# Patient Record
Sex: Male | Born: 1950 | Race: White | Hispanic: No | Marital: Married | Smoking: Never smoker
Health system: Southern US, Community
[De-identification: ages and names within clinical notes are randomized; demographics above are authoritative.]

## PROBLEM LIST (undated history)

## (undated) DIAGNOSIS — R3915 Urgency of urination: Secondary | ICD-10-CM

## (undated) DIAGNOSIS — Z9189 Other specified personal risk factors, not elsewhere classified: Secondary | ICD-10-CM

## (undated) DIAGNOSIS — K4090 Unilateral inguinal hernia, without obstruction or gangrene, not specified as recurrent: Secondary | ICD-10-CM

## (undated) DIAGNOSIS — R319 Hematuria, unspecified: Secondary | ICD-10-CM

## (undated) DIAGNOSIS — Z9889 Other specified postprocedural states: Secondary | ICD-10-CM

## (undated) DIAGNOSIS — R0600 Dyspnea, unspecified: Secondary | ICD-10-CM

## (undated) DIAGNOSIS — Z85820 Personal history of malignant melanoma of skin: Secondary | ICD-10-CM

## (undated) DIAGNOSIS — Z87441 Personal history of nephrotic syndrome: Secondary | ICD-10-CM

## (undated) DIAGNOSIS — R350 Frequency of micturition: Secondary | ICD-10-CM

## (undated) DIAGNOSIS — N3289 Other specified disorders of bladder: Secondary | ICD-10-CM

## (undated) DIAGNOSIS — N189 Chronic kidney disease, unspecified: Secondary | ICD-10-CM

## (undated) DIAGNOSIS — C4490 Unspecified malignant neoplasm of skin, unspecified: Secondary | ICD-10-CM

## (undated) DIAGNOSIS — C679 Malignant neoplasm of bladder, unspecified: Secondary | ICD-10-CM

## (undated) DIAGNOSIS — E785 Hyperlipidemia, unspecified: Secondary | ICD-10-CM

## (undated) DIAGNOSIS — C61 Malignant neoplasm of prostate: Secondary | ICD-10-CM

## (undated) DIAGNOSIS — C672 Malignant neoplasm of lateral wall of bladder: Secondary | ICD-10-CM

## (undated) DIAGNOSIS — Z87448 Personal history of other diseases of urinary system: Secondary | ICD-10-CM

## (undated) DIAGNOSIS — K429 Umbilical hernia without obstruction or gangrene: Secondary | ICD-10-CM

## (undated) DIAGNOSIS — R55 Syncope and collapse: Secondary | ICD-10-CM

## (undated) DIAGNOSIS — R972 Elevated prostate specific antigen [PSA]: Secondary | ICD-10-CM

## (undated) DIAGNOSIS — I2510 Atherosclerotic heart disease of native coronary artery without angina pectoris: Secondary | ICD-10-CM

## (undated) DIAGNOSIS — Z86718 Personal history of other venous thrombosis and embolism: Secondary | ICD-10-CM

## (undated) DIAGNOSIS — R310 Gross hematuria: Secondary | ICD-10-CM

## (undated) DIAGNOSIS — K219 Gastro-esophageal reflux disease without esophagitis: Secondary | ICD-10-CM

## (undated) DIAGNOSIS — I10 Essential (primary) hypertension: Secondary | ICD-10-CM

## (undated) DIAGNOSIS — Z87828 Personal history of other (healed) physical injury and trauma: Secondary | ICD-10-CM

## (undated) HISTORY — DX: Malignant neoplasm of prostate: C61

## (undated) HISTORY — DX: Elevated prostate specific antigen (PSA): R97.20

## (undated) HISTORY — PX: EYE SURGERY: SHX253

## (undated) HISTORY — PX: BLADDER INSTILLATION: SUR156

## (undated) HISTORY — DX: Hyperlipidemia, unspecified: E78.5

## (undated) HISTORY — PX: PROSTATE BIOPSY: SHX241

## (undated) HISTORY — DX: Chronic kidney disease, unspecified: N18.9

## (undated) HISTORY — DX: Malignant neoplasm of lateral wall of bladder: C67.2

## (undated) HISTORY — DX: Gastro-esophageal reflux disease without esophagitis: K21.9

## (undated) HISTORY — DX: Essential (primary) hypertension: I10

## (undated) HISTORY — DX: Gross hematuria: R31.0

## (undated) HISTORY — DX: Personal history of other diseases of urinary system: Z87.448

---

## 1987-08-19 HISTORY — PX: CHOLECYSTECTOMY: SHX55

## 2005-02-20 ENCOUNTER — Ambulatory Visit (HOSPITAL_BASED_OUTPATIENT_CLINIC_OR_DEPARTMENT_OTHER): Admission: RE | Admit: 2005-02-20 | Discharge: 2005-02-20 | Payer: Self-pay | Admitting: General Surgery

## 2005-02-20 ENCOUNTER — Ambulatory Visit (HOSPITAL_COMMUNITY): Admission: RE | Admit: 2005-02-20 | Discharge: 2005-02-20 | Payer: Self-pay | Admitting: General Surgery

## 2008-08-18 HISTORY — PX: MELANOMA EXCISION: SHX5266

## 2008-11-06 ENCOUNTER — Encounter: Admission: RE | Admit: 2008-11-06 | Discharge: 2008-11-06 | Payer: Self-pay | Admitting: Family Medicine

## 2008-12-22 ENCOUNTER — Encounter: Admission: RE | Admit: 2008-12-22 | Discharge: 2008-12-22 | Payer: Self-pay | Admitting: Nephrology

## 2009-02-02 ENCOUNTER — Encounter (INDEPENDENT_AMBULATORY_CARE_PROVIDER_SITE_OTHER): Payer: Self-pay | Admitting: Nephrology

## 2009-02-02 ENCOUNTER — Ambulatory Visit (HOSPITAL_COMMUNITY): Admission: RE | Admit: 2009-02-02 | Discharge: 2009-02-03 | Payer: Self-pay | Admitting: Nephrology

## 2009-02-08 ENCOUNTER — Encounter: Admission: RE | Admit: 2009-02-08 | Discharge: 2009-02-08 | Payer: Self-pay | Admitting: Nephrology

## 2009-05-16 ENCOUNTER — Inpatient Hospital Stay (HOSPITAL_COMMUNITY): Admission: EM | Admit: 2009-05-16 | Discharge: 2009-05-21 | Payer: Self-pay | Admitting: Emergency Medicine

## 2009-05-21 ENCOUNTER — Encounter (INDEPENDENT_AMBULATORY_CARE_PROVIDER_SITE_OTHER): Payer: Self-pay | Admitting: Nephrology

## 2009-05-21 ENCOUNTER — Ambulatory Visit: Payer: Self-pay | Admitting: Vascular Surgery

## 2009-05-30 ENCOUNTER — Encounter: Admission: RE | Admit: 2009-05-30 | Discharge: 2009-05-30 | Payer: Self-pay | Admitting: Family Medicine

## 2010-04-29 ENCOUNTER — Encounter: Admission: RE | Admit: 2010-04-29 | Discharge: 2010-05-17 | Payer: Self-pay | Admitting: Nephrology

## 2010-09-08 ENCOUNTER — Encounter: Payer: Self-pay | Admitting: Family Medicine

## 2010-11-21 LAB — CBC
HCT: 36.5 % — ABNORMAL LOW (ref 39.0–52.0)
HCT: 37.4 % — ABNORMAL LOW (ref 39.0–52.0)
HCT: 39.3 % (ref 39.0–52.0)
Hemoglobin: 12.5 g/dL — ABNORMAL LOW (ref 13.0–17.0)
Hemoglobin: 12.9 g/dL — ABNORMAL LOW (ref 13.0–17.0)
Hemoglobin: 13.9 g/dL (ref 13.0–17.0)
MCHC: 34.2 g/dL (ref 30.0–36.0)
MCHC: 34.3 g/dL (ref 30.0–36.0)
MCHC: 34.4 g/dL (ref 30.0–36.0)
MCHC: 35.3 g/dL (ref 30.0–36.0)
MCV: 93.7 fL (ref 78.0–100.0)
MCV: 94 fL (ref 78.0–100.0)
MCV: 94.2 fL (ref 78.0–100.0)
MCV: 94.5 fL (ref 78.0–100.0)
Platelets: 159 K/uL (ref 150–400)
Platelets: 173 K/uL (ref 150–400)
Platelets: 220 K/uL (ref 150–400)
RBC: 3.86 MIL/uL — ABNORMAL LOW (ref 4.22–5.81)
RBC: 3.97 MIL/uL — ABNORMAL LOW (ref 4.22–5.81)
RBC: 4.19 MIL/uL — ABNORMAL LOW (ref 4.22–5.81)
RDW: 13.6 % (ref 11.5–15.5)
RDW: 13.8 % (ref 11.5–15.5)
RDW: 13.9 % (ref 11.5–15.5)
RDW: 14.1 % (ref 11.5–15.5)
WBC: 10.5 K/uL (ref 4.0–10.5)
WBC: 6.9 K/uL (ref 4.0–10.5)
WBC: 7.9 K/uL (ref 4.0–10.5)

## 2010-11-21 LAB — COMPREHENSIVE METABOLIC PANEL WITH GFR
ALT: 21 U/L (ref 0–53)
AST: 23 U/L (ref 0–37)
Albumin: 1.8 g/dL — ABNORMAL LOW (ref 3.5–5.2)
Alkaline Phosphatase: 66 U/L (ref 39–117)
BUN: 16 mg/dL (ref 6–23)
CO2: 25 meq/L (ref 19–32)
Calcium: 8 mg/dL — ABNORMAL LOW (ref 8.4–10.5)
Chloride: 99 meq/L (ref 96–112)
Creatinine, Ser: 1.66 mg/dL — ABNORMAL HIGH (ref 0.4–1.5)
GFR calc non Af Amer: 43 mL/min — ABNORMAL LOW
Glucose, Bld: 94 mg/dL (ref 70–99)
Potassium: 3.7 meq/L (ref 3.5–5.1)
Sodium: 134 meq/L — ABNORMAL LOW (ref 135–145)
Total Bilirubin: 0.9 mg/dL (ref 0.3–1.2)
Total Protein: 5.3 g/dL — ABNORMAL LOW (ref 6.0–8.3)

## 2010-11-21 LAB — PROTIME-INR
INR: 1.2 (ref 0.00–1.49)
INR: 2.1 — ABNORMAL HIGH (ref 0.00–1.49)
Prothrombin Time: 15.1 s (ref 11.6–15.2)
Prothrombin Time: 23.7 s — ABNORMAL HIGH (ref 11.6–15.2)
Prothrombin Time: 25.2 seconds — ABNORMAL HIGH (ref 11.6–15.2)

## 2010-11-21 LAB — RENAL FUNCTION PANEL
Albumin: 1.5 g/dL — ABNORMAL LOW (ref 3.5–5.2)
Albumin: 1.5 g/dL — ABNORMAL LOW (ref 3.5–5.2)
BUN: 15 mg/dL (ref 6–23)
BUN: 21 mg/dL (ref 6–23)
BUN: 24 mg/dL — ABNORMAL HIGH (ref 6–23)
CO2: 25 mEq/L (ref 19–32)
CO2: 26 meq/L (ref 19–32)
Calcium: 7.6 mg/dL — ABNORMAL LOW (ref 8.4–10.5)
Calcium: 7.8 mg/dL — ABNORMAL LOW (ref 8.4–10.5)
Calcium: 7.9 mg/dL — ABNORMAL LOW (ref 8.4–10.5)
Chloride: 100 meq/L (ref 96–112)
Creatinine, Ser: 1.81 mg/dL — ABNORMAL HIGH (ref 0.4–1.5)
Creatinine, Ser: 1.94 mg/dL — ABNORMAL HIGH (ref 0.4–1.5)
GFR calc non Af Amer: 36 mL/min — ABNORMAL LOW
Glucose, Bld: 103 mg/dL — ABNORMAL HIGH (ref 70–99)
Glucose, Bld: 97 mg/dL (ref 70–99)
Phosphorus: 4 mg/dL (ref 2.3–4.6)
Phosphorus: 4 mg/dL (ref 2.3–4.6)
Phosphorus: 5.2 mg/dL — ABNORMAL HIGH (ref 2.3–4.6)
Potassium: 3.9 meq/L (ref 3.5–5.1)
Potassium: 4 mEq/L (ref 3.5–5.1)
Sodium: 134 meq/L — ABNORMAL LOW (ref 135–145)

## 2010-11-21 LAB — HEPARIN LEVEL (UNFRACTIONATED): Heparin Unfractionated: <0.1 IU/mL — ABNORMAL LOW (ref 0.30–0.70)

## 2010-11-21 LAB — HEMOCCULT GUIAC POC 1CARD (OFFICE): Fecal Occult Bld: NEGATIVE

## 2010-11-22 LAB — URINALYSIS, ROUTINE W REFLEX MICROSCOPIC
Glucose, UA: NEGATIVE mg/dL
Protein, ur: >300 mg/dL — AB
pH: 6 (ref 5.0–8.0)

## 2010-11-22 LAB — POCT I-STAT, CHEM 8
BUN: 8 mg/dL (ref 6–23)
Calcium, Ion: 1.08 mmol/L — ABNORMAL LOW (ref 1.12–1.32)
Creatinine, Ser: 1.3 mg/dL (ref 0.4–1.5)
Glucose, Bld: 109 mg/dL — ABNORMAL HIGH (ref 70–99)
Hemoglobin: 13.9 g/dL (ref 13.0–17.0)
Sodium: 137 mEq/L (ref 135–145)
TCO2: 24 mmol/L (ref 0–100)

## 2010-11-22 LAB — COMPREHENSIVE METABOLIC PANEL
AST: 14 U/L (ref 0–37)
CO2: 27 mEq/L (ref 19–32)
Calcium: 8.1 mg/dL — ABNORMAL LOW (ref 8.4–10.5)
Chloride: 101 mEq/L (ref 96–112)
Creatinine, Ser: 1.69 mg/dL — ABNORMAL HIGH (ref 0.4–1.5)
GFR calc Af Amer: 51 mL/min — ABNORMAL LOW (ref >60)
GFR calc non Af Amer: 42 mL/min — ABNORMAL LOW (ref >60)
Glucose, Bld: 98 mg/dL (ref 70–99)
Total Bilirubin: 0.8 mg/dL (ref 0.3–1.2)

## 2010-11-22 LAB — DIFFERENTIAL
Eosinophils Absolute: 0 10*3/uL (ref 0.0–0.7)
Eosinophils Relative: 1 % (ref 0–5)
Lymphocytes Relative: 11 % — ABNORMAL LOW (ref 12–46)
Lymphs Abs: 0.9 10*3/uL (ref 0.7–4.0)
Monocytes Absolute: 0.5 10*3/uL (ref 0.1–1.0)
Monocytes Relative: 6 % (ref 3–12)

## 2010-11-22 LAB — CBC
HCT: 38.9 % — ABNORMAL LOW (ref 39.0–52.0)
HCT: 41.7 % (ref 39.0–52.0)
Hemoglobin: 14.6 g/dL (ref 13.0–17.0)
MCV: 92.8 fL (ref 78.0–100.0)
MCV: 94.3 fL (ref 78.0–100.0)
Platelets: 150 10*3/uL (ref 150–400)
Platelets: 175 10*3/uL (ref 150–400)
RBC: 4.49 MIL/uL (ref 4.22–5.81)
RDW: 14 % (ref 11.5–15.5)
WBC: 8.2 10*3/uL (ref 4.0–10.5)

## 2010-11-22 LAB — PROTIME-INR: INR: 1 (ref 0.00–1.49)

## 2010-11-22 LAB — RENAL FUNCTION PANEL
Albumin: 2 g/dL — ABNORMAL LOW (ref 3.5–5.2)
BUN: 11 mg/dL (ref 6–23)
Creatinine, Ser: 1.43 mg/dL (ref 0.4–1.5)
GFR calc Af Amer: >60 mL/min (ref >60)
GFR calc non Af Amer: 51 mL/min — ABNORMAL LOW (ref >60)
Phosphorus: 3.9 mg/dL (ref 2.3–4.6)

## 2010-11-22 LAB — EXPECTORATED SPUTUM ASSESSMENT W GRAM STAIN, RFLX TO RESP C

## 2010-11-22 LAB — CULTURE, BLOOD (ROUTINE X 2): Culture: NO GROWTH

## 2010-11-22 LAB — CULTURE, RESPIRATORY W GRAM STAIN: Culture: NORMAL

## 2010-11-22 LAB — HEPARIN LEVEL (UNFRACTIONATED)
Heparin Unfractionated: 0.26 IU/mL — ABNORMAL LOW (ref 0.30–0.70)
Heparin Unfractionated: 0.39 IU/mL (ref 0.30–0.70)

## 2010-11-22 LAB — URINE MICROSCOPIC-ADD ON

## 2010-11-22 LAB — URINE CULTURE

## 2010-11-25 LAB — CBC
Hemoglobin: 14.4 g/dL (ref 13.0–17.0)
Hemoglobin: 15.8 g/dL (ref 13.0–17.0)
MCHC: 34.8 g/dL (ref 30.0–36.0)
MCHC: 35.1 g/dL (ref 30.0–36.0)
MCV: 92 fL (ref 78.0–100.0)
MCV: 92.1 fL (ref 78.0–100.0)
Platelets: 181 10*3/uL (ref 150–400)
RBC: 4.46 MIL/uL (ref 4.22–5.81)
RBC: 4.54 MIL/uL (ref 4.22–5.81)
RBC: 5.01 MIL/uL (ref 4.22–5.81)
RDW: 13.9 % (ref 11.5–15.5)
RDW: 14.2 % (ref 11.5–15.5)
WBC: 6.8 10*3/uL (ref 4.0–10.5)
WBC: 7.4 10*3/uL (ref 4.0–10.5)

## 2010-11-25 LAB — DIFFERENTIAL
Basophils Relative: 1 % (ref 0–1)
Eosinophils Absolute: 0.1 10*3/uL (ref 0.0–0.7)
Monocytes Relative: 8 % (ref 3–12)
Neutrophils Relative %: 61 % (ref 43–77)

## 2010-11-25 LAB — CROSSMATCH
ABO/RH(D): O POS
Antibody Screen: NEGATIVE

## 2010-11-25 LAB — COMPREHENSIVE METABOLIC PANEL
ALT: 18 U/L (ref 0–53)
Alkaline Phosphatase: 76 U/L (ref 39–117)
CO2: 28 mEq/L (ref 19–32)
Chloride: 107 mEq/L (ref 96–112)
Glucose, Bld: 109 mg/dL — ABNORMAL HIGH (ref 70–99)
Potassium: 4.3 mEq/L (ref 3.5–5.1)
Sodium: 142 mEq/L (ref 135–145)
Total Bilirubin: 0.5 mg/dL (ref 0.3–1.2)
Total Protein: 6.4 g/dL (ref 6.0–8.3)

## 2010-11-25 LAB — PROTIME-INR
INR: 0.9 (ref 0.00–1.49)
Prothrombin Time: 11.9 seconds (ref 11.6–15.2)

## 2010-11-25 LAB — PLATELET FUNCTION ASSAY

## 2010-12-31 NOTE — Discharge Summary (Signed)
[name], KUMPF NO.:  [id number]   MEDICAL RECORD NO.:  [id number]          PATIENT TYPE:  OIB   LOCATION:  6739                         FACILITY:  [facility]   PHYSICIAN:  Jaaziah L. Deterding, M.D.DATE OF BIRTH:  November 26, 1950   DATE OF ADMISSION:  02/02/2009  DATE OF DISCHARGE:  02/03/2009                               DISCHARGE SUMMARY   ADMITTING DIAGNOSES:  1. Nephrotic syndrome.  2. Hypertension.   DISCHARGE DIAGNOSES:  1. Nephrotic syndrome status post right renal biopsy.  2. Hypertension.   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 Fayrene Fearing  Deterding at [facility].  He was admitted for an  elective renal biopsy.  Serological workup was negative as an  outpatient.  He has carried mild edema and has mild hypertension.   LABORATORY DATA:  On admission, hemoglobin 15.8.  BUN and creatinine  normal.   HOSPITAL COURSE:  [name] underwent a right kidney biopsy under  ultrasound guidance by Dr. Fayrene Fearing Deterding.  He tolerated the procedure  well.  Post biopsy, he had some mild right flank discomfort, but no  serious pain and without gross hematuria.  He remained at bedrest until  the following day.  Post biopsy, hemoglobin was 14.4 and day of  discharge, hemoglobin was 14.6.  He had no flank pain at time of  discharge and his urine was yellow.  He was discharged in stable  condition, told to avoid strenuous activity, and no heavy lifting  greater than 10 pounds.  He was instructed to call [facility] immediately should he have sharp acute onset of right  flank pain or his urine becomes bloody.   DISCHARGE MEDICATIONS:  1. Zyrtec 10 mg daily as needed.  2. Hydrochlorothiazide 12.5 mg q.a.m.   No aspirin, Motrin, Aleve, Advil or generic equivalents.  Dr. Darrick Penna  will phone the patient with biopsy results mid next week.      Zenovia Jordan, P.A.    ______________________________  Llana Aliment. Deterding, M.D.    RRK/MEDQ  D:  02/03/2009  T:  02/04/2009  Job:  [id number]

## 2011-03-26 ENCOUNTER — Encounter (INDEPENDENT_AMBULATORY_CARE_PROVIDER_SITE_OTHER): Payer: Self-pay | Admitting: General Surgery

## 2011-03-27 ENCOUNTER — Encounter (INDEPENDENT_AMBULATORY_CARE_PROVIDER_SITE_OTHER): Payer: Self-pay | Admitting: General Surgery

## 2011-03-27 ENCOUNTER — Ambulatory Visit (INDEPENDENT_AMBULATORY_CARE_PROVIDER_SITE_OTHER): Payer: BC Managed Care – PPO | Admitting: General Surgery

## 2011-03-27 DIAGNOSIS — K4090 Unilateral inguinal hernia, without obstruction or gangrene, not specified as recurrent: Secondary | ICD-10-CM | POA: Insufficient documentation
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
## 2011-03-27 NOTE — Patient Instructions (Signed)
Need clearance for surgery from Dr. Darrick Penna Will need to stop aspirin and cytoxan 5 days before surgery

## 2011-04-10 ENCOUNTER — Telehealth (INDEPENDENT_AMBULATORY_CARE_PROVIDER_SITE_OTHER): Payer: Self-pay | Admitting: General Surgery

## 2011-04-10 NOTE — Telephone Encounter (Signed)
Pt is calling to see if we have spoken with Dr. Darrick Penna regarding surgery. Please contact the patient in regards to this.

## 2011-05-20 ENCOUNTER — Other Ambulatory Visit (INDEPENDENT_AMBULATORY_CARE_PROVIDER_SITE_OTHER): Payer: Self-pay | Admitting: General Surgery

## 2011-05-20 ENCOUNTER — Encounter (HOSPITAL_COMMUNITY)
Admission: RE | Admit: 2011-05-20 | Discharge: 2011-05-20 | Disposition: A | Discharge status: Home / Self Care | Payer: BC Managed Care – PPO | Source: Ambulatory Visit | Attending: General Surgery | Admitting: General Surgery

## 2011-05-20 DIAGNOSIS — K4090 Unilateral inguinal hernia, without obstruction or gangrene, not specified as recurrent: Secondary | ICD-10-CM

## 2011-05-20 LAB — CBC
Hemoglobin: 14.8 g/dL (ref 13.0–17.0)
Platelets: 165 10*3/uL (ref 150–400)
RBC: 4.58 MIL/uL (ref 4.22–5.81)
WBC: 6.3 10*3/uL (ref 4.0–10.5)

## 2011-05-20 LAB — DIFFERENTIAL
Basophils Relative: 1 % (ref 0–1)
Eosinophils Absolute: 0.1 10*3/uL (ref 0.0–0.7)
Monocytes Relative: 8 % (ref 3–12)
Neutro Abs: 4.2 10*3/uL (ref 1.7–7.7)
Neutrophils Relative %: 66 % (ref 43–77)

## 2011-05-20 LAB — BASIC METABOLIC PANEL
Chloride: 103 mEq/L (ref 96–112)
GFR calc Af Amer: 82 mL/min — ABNORMAL LOW (ref >90)
GFR calc non Af Amer: 71 mL/min — ABNORMAL LOW (ref >90)
Potassium: 4.7 mEq/L (ref 3.5–5.1)
Sodium: 138 mEq/L (ref 135–145)

## 2011-05-20 LAB — SURGICAL PCR SCREEN
MRSA, PCR: NEGATIVE
Staphylococcus aureus: NEGATIVE

## 2011-05-26 ENCOUNTER — Ambulatory Visit (HOSPITAL_COMMUNITY)
Admission: RE | Admit: 2011-05-26 | Discharge: 2011-05-26 | Disposition: A | Discharge status: Home / Self Care | Payer: BC Managed Care – PPO | Source: Ambulatory Visit | Attending: General Surgery | Admitting: General Surgery

## 2011-05-26 DIAGNOSIS — K4090 Unilateral inguinal hernia, without obstruction or gangrene, not specified as recurrent: Secondary | ICD-10-CM | POA: Insufficient documentation

## 2011-05-26 DIAGNOSIS — Z01818 Encounter for other preprocedural examination: Secondary | ICD-10-CM | POA: Insufficient documentation

## 2011-05-26 DIAGNOSIS — Z01812 Encounter for preprocedural laboratory examination: Secondary | ICD-10-CM | POA: Insufficient documentation

## 2011-05-26 DIAGNOSIS — Z01810 Encounter for preprocedural cardiovascular examination: Secondary | ICD-10-CM | POA: Insufficient documentation

## 2011-05-26 DIAGNOSIS — I10 Essential (primary) hypertension: Secondary | ICD-10-CM | POA: Insufficient documentation

## 2011-05-26 HISTORY — PX: INGUINAL HERNIA REPAIR: SUR1180

## 2011-05-27 ENCOUNTER — Telehealth (INDEPENDENT_AMBULATORY_CARE_PROVIDER_SITE_OTHER): Payer: Self-pay | Admitting: General Surgery

## 2011-05-27 NOTE — Telephone Encounter (Signed)
[name] called and stated he had an inguinal hernia repair by Dr. Carolynne Edouard yesterday. He stated that he felt warm and is running a fever of 100. There is no redness around the incision. I felt this was likely pulmonary in origin. I encouraged him to stand up and walk around and take deep breathes.  If he has fever greater than 101 I told him to call back.

## 2011-06-04 ENCOUNTER — Telehealth (INDEPENDENT_AMBULATORY_CARE_PROVIDER_SITE_OTHER): Payer: Self-pay

## 2011-06-04 NOTE — Telephone Encounter (Signed)
C/O small amount of drainage from hernia incision site- seen on dressing- still puffy- RIH repair 05/26/2011- Patient was told this is not uncommon if drainage amount or swelling increases please call back. Or if incision show any signs of infection call back. Patient was told the signs of infection. RMP

## 2011-06-05 ENCOUNTER — Ambulatory Visit (INDEPENDENT_AMBULATORY_CARE_PROVIDER_SITE_OTHER): Payer: BC Managed Care – PPO | Admitting: General Surgery

## 2011-06-05 ENCOUNTER — Encounter (INDEPENDENT_AMBULATORY_CARE_PROVIDER_SITE_OTHER): Payer: Self-pay | Admitting: General Surgery

## 2011-06-05 DIAGNOSIS — K4090 Unilateral inguinal hernia, without obstruction or gangrene, not specified as recurrent: Secondary | ICD-10-CM

## 2011-06-05 NOTE — Telephone Encounter (Signed)
APPT MADE FOR PT TODAY WITH DR. Carolynne Edouard.

## 2011-06-05 NOTE — Telephone Encounter (Signed)
i would like to see him soon. He should not have any drainage.

## 2011-06-05 NOTE — Telephone Encounter (Signed)
APPT MADE WITH DR. TOTH TODAY

## 2011-06-05 NOTE — Progress Notes (Signed)
Subjective:     Patient ID: [name], male   DOB: [birth year], 60 y.o.   MRN: [id number]  HPI The patient is a 60 year old white male who is now about 10 days out from a right inguinal hernia repair with mesh. He called to say that he was having a little drainage from the incision. We brought him in today to check this and make sure it was okay. He denies any fevers or chills. His discomfort is improving. Review of Systems     Objective:   Physical Exam On exam the patient's abdomen is soft nontender. The right groin incision is healing well. There is no sign of infection. I think the drainage is just a little bit of serous fluid from a couple of raw areas at the skin incision.    Assessment:     10 days out from a right inguinal hernia repair with mesh    Plan:     He seems to be doing well. I've encouraged him to continue to shower daily and keep area clean and dry. We will plan to see him back in about 2 weeks for recheck.

## 2011-06-05 NOTE — Patient Instructions (Signed)
Shower daily Keep area clean and dry

## 2011-06-06 NOTE — Op Note (Signed)
NAMEDEANTE, BLOUGH NO.:  [id number]  MEDICAL RECORD NO.:  [id number]  LOCATION:  SDSC                         FACILITY:  [facility]  PHYSICIAN:  Ollen Gross. Vernell Morgans, M.D. DATE OF BIRTH:  12-31-1950  DATE OF PROCEDURE:  05/26/2011 DATE OF DISCHARGE:  05/26/2011                              OPERATIVE REPORT   PREOPERATIVE DIAGNOSIS:  Right inguinal hernia.  POSTOPERATIVE DIAGNOSIS:  Right indirect inguinal hernia.  PROCEDURE:  Right inguinal hernia repair with mesh.  SURGEON:  Ollen Gross. Vernell Morgans, MD  ANESTHESIA:  General via LMA.  DESCRIPTION OF PROCEDURE:  After informed consent was obtained, the patient was brought to the operating room, placed in the supine position on the operating room table.  After adequate induction of general anesthesia, the patient's abdomen was prepped with ChloraPrep, allowed to dry, and draped in usual sterile manner.  The right groin area was then infiltrated with 0.25% Marcaine.  A small incision was made from the edge of the pubic tubercle on the right towards the anterior cephalic spine.  This incision was carried down through the skin and subcutaneous tissue sharply with the electrocautery until the fascia of the external oblique was encountered.  Two small bridging veins were clamped with hemostats, divided, and ligated 3-0 silk ties.  The external oblique fascia was opened along its fibers towards the apex of the external ring with a 15-blade knife and Metzenbaum scissors. Weitlaner retractor was deployed.  Blunt dissection was carried out of the cord structures until they could be surrounded between 2 fingers.  A 1/2-inch Penrose drain was placed around the cord structures for retraction purposes.  The cord structures were then gently skeletonized by blunt hemostat dissection and some sharp dissection with the electrocautery until a hernia sac was able to be identified.  He was gently separated from the rest of the cord  structures again by blunt hemostat dissection and some sharp dissection with the electrocautery. The sac was very thick walled and broad based.  We therefore decided not to try to open the sac, but simply to reduce it.  We reduced back beneath the transversalis muscle and then repaired the floor of the canal over this with the interrupted 0 Vicryl stitches.  Once this was accomplished, the floor appeared to be intact.  The hernia was reduced. We then chose a 3 x 6 piece of UltraPro mesh and cut it to fit.  The inferior edge of the mesh was sewed to the shelving edge of the inguinal ligament with a running 2-0 Prolene stitch.  Tails were cut in the mesh laterally.  The tails were wrapped around the cord structures. Superiorly, the mesh was sewed to the muscular aponeurotic strength layer of the transversalis with interrupted 2-0 Prolene vertical mattress stitches.  Lateral to the cord, the mesh was sewed to the shelving edge of the inguinal ligament with interrupted 2-0 Prolene stitch.  Prior to all this, the ileal inguinal nerve was identified, it was involved with some scar tissue, was dissected free, proximally and distally divided, and ligated with 3-0 silk ties.  At this point, the hernia appeared to be well repaired and the mesh  was in good position without any tension.  The wound was irrigated with copious amounts of saline.  The external oblique fascia was reapproximated with running 2-0 Vicryl stitch subcutaneous.  The wound was infiltrated with 0.25% Marcaine.  The subcutaneous fascia was closed with 2 layers of 3-0 Vicryl stitches and the skin was closed with a running 4-0 Monocryl subcuticular stitch.  Dermabond dressing was applied.  The patient tolerated the procedure well.  At the end of the case, all needle, sponge, and instrument counts were correct.  The patient's testicle was in the scrotum at the end of the case . He was taken to the recovery room in stable  condition.     Ollen Gross. Vernell Morgans, M.D.     PST/MEDQ  D:  05/26/2011  T:  05/26/2011  Job:  [id number]  Electronically Signed by Chevis Pretty III M.D. on 06/06/2011 09:08:39 AM

## 2011-06-17 ENCOUNTER — Ambulatory Visit (INDEPENDENT_AMBULATORY_CARE_PROVIDER_SITE_OTHER): Payer: BC Managed Care – PPO | Admitting: General Surgery

## 2011-06-17 ENCOUNTER — Encounter (INDEPENDENT_AMBULATORY_CARE_PROVIDER_SITE_OTHER): Payer: Self-pay | Admitting: General Surgery

## 2011-06-17 DIAGNOSIS — K4090 Unilateral inguinal hernia, without obstruction or gangrene, not specified as recurrent: Secondary | ICD-10-CM

## 2011-06-17 NOTE — Progress Notes (Signed)
Subjective:     Patient ID: [name], male   DOB: 1950-10-16, 60 y.o.   MRN: [id number]  HPI The patient is a 60 year old white male who is now 3 weeks out from a right inguinal hernia repair with mesh. He is doing well and has no complaints today. He recently had an upper respiratory infection and has been coughing but this has not been causing much difficulty.  Review of Systems     Objective:   Physical Exam On exam his abdomen is soft and nontender. His right groin incision is healing nicely. There is no sign of infection. There is no palpable evidence for recurrence of the hernia.    Assessment:     3 weeks status post right inguinal hernia repair with mesh    Plan:     At this point I would like him to continue to refrain from any heavy lifting. We will plan to see him back in about 3 weeks to check his progress.

## 2011-06-17 NOTE — Patient Instructions (Signed)
Do not lift anything heavy for another 3 weeks

## 2011-07-15 ENCOUNTER — Encounter (INDEPENDENT_AMBULATORY_CARE_PROVIDER_SITE_OTHER): Payer: Self-pay | Admitting: General Surgery

## 2011-07-15 ENCOUNTER — Ambulatory Visit (INDEPENDENT_AMBULATORY_CARE_PROVIDER_SITE_OTHER): Payer: BC Managed Care – PPO | Admitting: General Surgery

## 2011-07-15 DIAGNOSIS — K4090 Unilateral inguinal hernia, without obstruction or gangrene, not specified as recurrent: Secondary | ICD-10-CM

## 2011-07-15 NOTE — Patient Instructions (Signed)
May return to all normal activities 

## 2011-07-15 NOTE — Progress Notes (Signed)
Subjective:     Patient ID: [name], male   DOB: 05/19/1951, 60 y.o.   MRN: [id number]  HPI The patient is a 60 year old white male who is now about 6 weeks out from a right inguinal hernia repair with mesh. He is doing very well. He has no complaints today. He's not having any abdominal pain. His appetite is good and his bowels are working normally.  Review of Systems     Objective:   Physical Exam On exam his abdomen is soft and nontender. His right inguinal incision is healing nicely. There is no sign of infection. There is no palpable evidence of recurrence of the hernia.    Assessment:     6 weeks status post right inguinal hernia repair with mesh    Plan:     At this point I believe he can return on his normal activities without any restrictions. We will plan to see him back on a p.r.n. basis

## 2011-10-07 ENCOUNTER — Ambulatory Visit
Admission: RE | Admit: 2011-10-07 | Discharge: 2011-10-07 | Disposition: A | Discharge status: Home / Self Care | Payer: BC Managed Care – PPO | Source: Ambulatory Visit | Attending: Family Medicine | Admitting: Family Medicine

## 2011-10-07 ENCOUNTER — Other Ambulatory Visit: Payer: Self-pay | Admitting: Family Medicine

## 2011-10-07 DIAGNOSIS — R05 Cough: Secondary | ICD-10-CM

## 2011-10-07 DIAGNOSIS — R059 Cough, unspecified: Secondary | ICD-10-CM

## 2011-10-07 DIAGNOSIS — R0781 Pleurodynia: Secondary | ICD-10-CM

## 2013-05-24 ENCOUNTER — Other Ambulatory Visit: Payer: Self-pay | Admitting: Gastroenterology

## 2014-06-14 ENCOUNTER — Encounter (HOSPITAL_BASED_OUTPATIENT_CLINIC_OR_DEPARTMENT_OTHER): Payer: Self-pay | Admitting: *Deleted

## 2014-06-14 ENCOUNTER — Other Ambulatory Visit: Payer: Self-pay | Admitting: Urology

## 2014-06-14 NOTE — H&P (Signed)
Active Problems Problems  1. Benign prostate hyperplasia (N40.0) 2. 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 Elevated prostate specific antigen (PSA) (R97.2) 4. Gross hematuria (R31.0) 5. Microscopic hematuria (R31.2)  History of Present Illness [name] is a 63 yo WM who is sent in consultation by Dr. [name] for gross hematuria with clots and some obstructive symptoms that started about 2 weeks ago. He was placed on cytoxan prior to this and a week after starting he had a low grade fever, cough and fever blister.  On 10/15 he noticed the gross blood. He has had increased hematuria since then. He was given Cipro for a possible UTI.  He had a clot with obstruction a week ago. He had a similar episode 2 days ago. He has no other voiding symptoms. He had some RLQ pain 3 weeks ago after severe coughing. He was seen by Dr. 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 He had a pulmonary embolus. He had been on warfarin in 2010 but is now just on a daily ASA. He had a recent increased in proteinuria to 3.6gm daily and the Cr bumped to 2. He was treated with cytoxan and prednisone on 9/25 and he stopped it yesterday. His last Cr was down to 1. He last had a CT in 2010.   Past Medical History Problems  1. History of glomerulonephritis (Z87.448) 2. History of heartburn (Z87.898) 3. History of hypercholesterolemia (Z86.39) 4. History of hypertension (Z86.79) 5. History of pulmonary embolism (Z86.711)  Surgical History Problems  1. History of Cholecystectomy 2. History of Destruction Of Malignant Lesion 3. History of Hernia Repair  Current Meds 1. Aspirin 325 MG Oral Tablet;  Therapy: (Recorded:28Oct2015) to Recorded 2. Simvastatin 40 MG Oral Tablet;  Therapy: (Recorded:28Oct2015) to Recorded 3. Twynsta 80-5 MG Oral Tablet; takes 1/2 tab daily;  Therapy: (Recorded:28Oct2015) to  Recorded  Allergies Medication  1. Vibramycin CAPS  Family History Problems  1. Family history of Family Health Status Number Of Children 2. Family history of congestive heart failure (Z82.49) : Mother 3. Family history of hypertension (Z82.49) : Father 4. Family history of lung cancer (Z80.1) : Father 5. Family history of malignant neoplasm of breast (Z80.3) : Mother, Sister 62. Family history of systemic lupus erythematosus (Z82.69) 7. Family history of Hematuria : Mother 74. Family history of Hypertension : Father 70. Family history of Hypertension : Mother  Social History Problems    Alcohol Use   Caffeine Use   Father deceased   Marital History - Currently Married   Mother deceased   Never a smoker   Number of children   Denied: History of Tobacco Use  Review of Systems Genitourinary, constitutional, skin, eye, otolaryngeal, hematologic/lymphatic, cardiovascular, pulmonary, endocrine, musculoskeletal, gastrointestinal, neurological and psychiatric system(s) were reviewed and pertinent findings if present are noted. and are otherwise negative.  Genitourinary: urinary frequency, nocturia, difficulty starting the urinary stream, urinary stream starts and stops, hematuria and erectile dysfunction.  Eyes: blurred vision.  ENT: sinus problems.  Respiratory: cough.    Vitals Vital Signs [Data Includes: Last 1 Day]  Recorded: 28Oct2015 11:42AM  Height: 5 ft 10 in Weight: 245 lb  BMI Calculated: 35.15 BSA Calculated: 2.28 Blood Pressure: 111 / 77 Temperature: 98.9 F Heart Rate: 105  Physical Exam Constitutional: Well nourished and well developed . No acute distress.  ENT:. The ears and nose are normal in appearance.  Neck: The appearance of the neck is normal  and no neck mass is present.  Pulmonary: No respiratory distress and normal respiratory rhythm and effort.  Cardiovascular: Heart rate and rhythm are normal . No peripheral edema.  Abdomen: The abdomen is  mildly obese. The abdomen is soft and nontender. No masses are palpated. No CVA tenderness. No hernias are palpable. No hepatosplenomegaly noted.  Rectal: Rectal exam demonstrates normal sphincter tone, no tenderness and no masses. Estimated prostate size is 2+. The prostate has no nodularity and is not tender. The left seminal vesicle is nonpalpable. The right seminal vesicle is nonpalpable. The perineum is normal on inspection.  Genitourinary: Examination of the penis demonstrates no discharge, no masses, no lesions and a normal meatus. The scrotum is without lesions. The right epididymis is palpably normal and non-tender. The left epididymis is palpably normal and non-tender. The right testis is non-tender and without masses. The left testis is non-tender and without masses.  Lymphatics: The supraclavicular, femoral and inguinal nodes are not enlarged or tender.  Skin: Normal skin turgor, no visible rash and no visible skin lesions.  Neuro/Psych:. Mood and affect are appropriate. Normal sensation of the perineum/perianal region (S3,4,5).    Results/Data Urine [Data Includes: Last 1 Day]   28Oct2015  COLOR RED   APPEARANCE CLOUDY   SPECIFIC GRAVITY 1.020   pH 6.5   GLUCOSE NEG mg/dL  BILIRUBIN SMALL   KETONE TRACE mg/dL  BLOOD LARGE   PROTEIN 100 mg/dL  UROBILINOGEN 1 mg/dL  NITRITE NEG   LEUKOCYTE ESTERASE NEG   SQUAMOUS EPITHELIAL/HPF NONE SEEN   WBC NONE SEEN WBC/hpf  RBC TNTC RBC/hpf  BACTERIA MODERATE   CRYSTALS NONE SEEN   CASTS NONE SEEN    Old records or history reviewed: records from Dr. [name] and our prior office notes were reviewed.  The following clinical lab reports were reviewed:  CT films reviewed. The kidneys are unremarkable but there is a hyperdense material in the right bladder base that could be clot or tumor.  The following medical tests were reviewed: UA reviewed.    Procedure  Procedure: Cystoscopy   Indication: Hematuria. Lower Urinary Tract  Symptoms.  Informed Consent: Risks, benefits, and potential adverse events were discussed and informed consent was obtained from the patient.  Prep: The patient was prepped with betadine.  Anesthesia:. Local anesthesia was administered intraurethrally with 2% lidocaine jelly.  Antibiotic prophylaxis: Ciprofloxacin.  Procedure Note:  Urethral meatus:. No abnormalities.  Anterior urethra: No abnormalities.  Prostatic urethra: No abnormalities . Estimated length was 4 cm. There was visual obstruction of the prostatic urethra. The lateral and median prostatic lobes were enlarged.  Bladder: Visulization was clear. The ureteral orifices were in the normal anatomic position bilaterally and had clear efflux of urine. A systematic survey of the bladder demonstrated no bladder tumors or stones. Examination of the bladder demonstrated clot within the bladder (He has old clot in the bladder with an adherent clot on the right lateral wall. I don't see an obvious tumor but can't see below the clot. ) and mild trabeculation. The patient tolerated the procedure well.  Complications: None.    Assessment Assessed  1. Gross hematuria (R31.0) 2. Benign prostate hyperplasia (N40.0)  He gross hematuria with bleeding from the right bladder wall of uncertain etiology.   Plan  Benign prostate hyperplasia  1. PSA REFLEX TO FREE; Status:Hold For - Specimen/Data Collection,Appointment;  Requested for:28Oct2015;  Gross hematuria  2. Follow-up Schedule Surgery Office  Follow-up  Status: Hold For - Appointment   Requested for:  28Oct2015 3. AU CT-HEMATURIA PROTOCOL; Status:Canceled - Appointment,PreCert,Date of  Service,Print;  4. AU CT-STONE PROTOCOL; Status:In Progress - Specimen/Data Collected;   Done:  28Oct2015 12:00AM 5. BUN & CREATININE; Status:Canceled - Specimen/Data Collection,Appointment;  Health Maintenance  6. UA With REFLEX; [Do Not Release]; Status:Resulted - Requires Verification;   Done:  [id number]  11:03AM  I am going to get him set up for outpatient cystoscopy with clot evacuation, bladder biopsy and fulguration and will try to do that tomorrow.  I have reviewed the risks of bleeding, infection, bladder and urethral injury, thrombotic events and anesthetic complications.       BUN & CREATININE; Status:Hold For - Specimen/Data Collection,Appointment; Requested [id number];   Perform:[facility]; Due:30Oct2015; Marked Important; Last Updated UY:QIHKV, Debbie; 06/14/2014 12:31:35 PM;Ordered; Stat;   QQV:ZDGLO hematuria; Ordered VF:IEPPI, Roldan Laforest;   Discussion/Summary CC :Dr. Jeneen Rinks Deterding.

## 2014-06-14 NOTE — Progress Notes (Signed)
NPO AFTER MN.  ARRIVE AT 1015.  NEEDS ISTAT AND EKG.  

## 2014-06-14 NOTE — Progress Notes (Signed)
06/14/14 1547  OBSTRUCTIVE SLEEP APNEA  Have you ever been diagnosed with sleep apnea through a sleep study? No  Do you snore loudly (loud enough to be heard through closed doors)?  0  Do you often feel tired, fatigued, or sleepy during the daytime? 0  Has anyone observed you stop breathing during your sleep? 0  Do you have, or are you being treated for high blood pressure? 1  BMI more than 35 kg/m2? 0  Age over 61 years old? 1  Neck circumference greater than 40 cm/16 inches? 1  Gender: 1  Obstructive Sleep Apnea Score 4  Score 4 or greater  Results sent to PCP

## 2014-06-15 ENCOUNTER — Ambulatory Visit (HOSPITAL_BASED_OUTPATIENT_CLINIC_OR_DEPARTMENT_OTHER): Payer: BC Managed Care – PPO | Admitting: Anesthesiology

## 2014-06-15 ENCOUNTER — Encounter (HOSPITAL_BASED_OUTPATIENT_CLINIC_OR_DEPARTMENT_OTHER): Payer: BC Managed Care – PPO | Admitting: Anesthesiology

## 2014-06-15 ENCOUNTER — Encounter (HOSPITAL_BASED_OUTPATIENT_CLINIC_OR_DEPARTMENT_OTHER)
Admission: RE | Disposition: A | Discharge status: Home / Self Care | Payer: Self-pay | Source: Ambulatory Visit | Attending: Urology

## 2014-06-15 ENCOUNTER — Encounter (HOSPITAL_BASED_OUTPATIENT_CLINIC_OR_DEPARTMENT_OTHER): Payer: Self-pay | Admitting: *Deleted

## 2014-06-15 ENCOUNTER — Ambulatory Visit (HOSPITAL_BASED_OUTPATIENT_CLINIC_OR_DEPARTMENT_OTHER)
Admission: RE | Admit: 2014-06-15 | Discharge: 2014-06-15 | Disposition: A | Discharge status: Home / Self Care | Payer: BC Managed Care – PPO | Source: Ambulatory Visit | Attending: Urology | Admitting: Urology

## 2014-06-15 DIAGNOSIS — C679 Malignant neoplasm of bladder, unspecified: Secondary | ICD-10-CM | POA: Diagnosis present

## 2014-06-15 DIAGNOSIS — N3289 Other specified disorders of bladder: Secondary | ICD-10-CM | POA: Insufficient documentation

## 2014-06-15 DIAGNOSIS — I10 Essential (primary) hypertension: Secondary | ICD-10-CM | POA: Diagnosis not present

## 2014-06-15 DIAGNOSIS — N320 Bladder-neck obstruction: Secondary | ICD-10-CM | POA: Diagnosis not present

## 2014-06-15 DIAGNOSIS — C672 Malignant neoplasm of lateral wall of bladder: Secondary | ICD-10-CM | POA: Insufficient documentation

## 2014-06-15 DIAGNOSIS — Z881 Allergy status to other antibiotic agents status: Secondary | ICD-10-CM | POA: Diagnosis not present

## 2014-06-15 DIAGNOSIS — E780 Pure hypercholesterolemia: Secondary | ICD-10-CM | POA: Diagnosis not present

## 2014-06-15 DIAGNOSIS — N401 Enlarged prostate with lower urinary tract symptoms: Secondary | ICD-10-CM | POA: Insufficient documentation

## 2014-06-15 DIAGNOSIS — Z86711 Personal history of pulmonary embolism: Secondary | ICD-10-CM | POA: Insufficient documentation

## 2014-06-15 HISTORY — PX: TRANSURETHRAL RESECTION OF BLADDER TUMOR WITH GYRUS (TURBT-GYRUS): SHX6458

## 2014-06-15 HISTORY — DX: Other specified disorders of bladder: N32.89

## 2014-06-15 HISTORY — DX: Personal history of other (healed) physical injury and trauma: Z87.828

## 2014-06-15 HISTORY — DX: Personal history of other venous thrombosis and embolism: Z86.718

## 2014-06-15 HISTORY — PX: CYSTOSCOPY WITH BIOPSY: SHX5122

## 2014-06-15 HISTORY — DX: Personal history of nephrotic syndrome: Z87.441

## 2014-06-15 HISTORY — DX: Personal history of malignant melanoma of skin: Z85.820

## 2014-06-15 HISTORY — DX: Frequency of micturition: R35.0

## 2014-06-15 HISTORY — DX: Hematuria, unspecified: R31.9

## 2014-06-15 HISTORY — DX: Other specified personal risk factors, not elsewhere classified: Z91.89

## 2014-06-15 HISTORY — DX: Urgency of urination: R39.15

## 2014-06-15 HISTORY — DX: Other specified postprocedural states: Z98.890

## 2014-06-15 LAB — POCT I-STAT, CHEM 8
BUN: 18 mg/dL (ref 6–23)
Calcium, Ion: 1.25 mmol/L (ref 1.13–1.30)
Chloride: 106 mEq/L (ref 96–112)
Creatinine, Ser: 1 mg/dL (ref 0.50–1.35)
Glucose, Bld: 99 mg/dL (ref 70–99)
HCT: 45 % (ref 39.0–52.0)
Hemoglobin: 15.3 g/dL (ref 13.0–17.0)
Potassium: 4.1 mEq/L (ref 3.7–5.3)
SODIUM: 141 meq/L (ref 137–147)
TCO2: 24 mmol/L (ref 0–100)

## 2014-06-15 SURGERY — CYSTOSCOPY, WITH BIOPSY
Anesthesia: General | Site: Bladder

## 2014-06-15 MED ORDER — SODIUM CHLORIDE 0.9 % IR SOLN
Status: DC | PRN
  Administered 2014-06-15: 6000 mL via INTRAVESICAL

## 2014-06-15 MED ORDER — MIDAZOLAM HCL 5 MG/5ML IJ SOLN
INTRAMUSCULAR | Status: DC | PRN
  Administered 2014-06-15: 2 mg via INTRAVENOUS

## 2014-06-15 MED ORDER — MIDAZOLAM HCL 2 MG/2ML IJ SOLN
INTRAMUSCULAR | Status: AC
  Filled 2014-06-15: qty 2

## 2014-06-15 MED ORDER — ONDANSETRON HCL 4 MG/2ML IJ SOLN
INTRAMUSCULAR | Status: DC | PRN
  Administered 2014-06-15: 4 mg via INTRAVENOUS

## 2014-06-15 MED ORDER — FENTANYL CITRATE 0.05 MG/ML IJ SOLN
INTRAMUSCULAR | Status: DC | PRN
  Administered 2014-06-15: 50 ug via INTRAVENOUS

## 2014-06-15 MED ORDER — ROCURONIUM BROMIDE 100 MG/10ML IV SOLN
INTRAVENOUS | Status: DC | PRN
  Administered 2014-06-15: 25 mg via INTRAVENOUS

## 2014-06-15 MED ORDER — LIDOCAINE HCL (CARDIAC) 20 MG/ML IV SOLN
INTRAVENOUS | Status: DC | PRN
  Administered 2014-06-15: 100 mg via INTRAVENOUS

## 2014-06-15 MED ORDER — PHENAZOPYRIDINE HCL 200 MG PO TABS: 200.0000 mg | ORAL_TABLET | Freq: Three times a day (TID) | ORAL | Status: DC | PRN

## 2014-06-15 MED ORDER — CIPROFLOXACIN IN D5W 400 MG/200ML IV SOLN
INTRAVENOUS | Status: AC
  Filled 2014-06-15: qty 200

## 2014-06-15 MED ORDER — NEOSTIGMINE METHYLSULFATE 10 MG/10ML IV SOLN
INTRAVENOUS | Status: DC | PRN
  Administered 2014-06-15: 3 mg via INTRAVENOUS

## 2014-06-15 MED ORDER — FENTANYL CITRATE 0.05 MG/ML IJ SOLN
INTRAMUSCULAR | Status: AC
  Filled 2014-06-15: qty 4

## 2014-06-15 MED ORDER — EPHEDRINE SULFATE 50 MG/ML IJ SOLN
INTRAMUSCULAR | Status: DC | PRN
  Administered 2014-06-15: 10 mg via INTRAVENOUS
  Administered 2014-06-15: 15 mg via INTRAVENOUS

## 2014-06-15 MED ORDER — LACTATED RINGERS IV SOLN
INTRAVENOUS | Status: DC
  Administered 2014-06-15 (×2): via INTRAVENOUS
  Filled 2014-06-15: qty 1000

## 2014-06-15 MED ORDER — STERILE WATER FOR IRRIGATION IR SOLN
Status: DC | PRN
  Administered 2014-06-15: 3000 mL

## 2014-06-15 MED ORDER — MITOMYCIN CHEMO FOR BLADDER INSTILLATION 40 MG
40.0000 mg | Freq: Once | INTRAVENOUS | Status: AC
  Administered 2014-06-15: 40 mg via INTRAVESICAL
  Filled 2014-06-15: qty 40

## 2014-06-15 MED ORDER — HYDROCODONE-ACETAMINOPHEN 5-325 MG PO TABS: 1.0000 | ORAL_TABLET | Freq: Four times a day (QID) | ORAL | Status: DC | PRN

## 2014-06-15 MED ORDER — PROPOFOL INFUSION 10 MG/ML OPTIME
INTRAVENOUS | Status: DC | PRN
  Administered 2014-06-15: 50 mL via INTRAVENOUS
  Administered 2014-06-15: 200 mL via INTRAVENOUS

## 2014-06-15 MED ORDER — GLYCOPYRROLATE 0.2 MG/ML IJ SOLN
INTRAMUSCULAR | Status: DC | PRN
Start: 2014-06-15 — End: 2014-06-15
  Administered 2014-06-15: 0.4 mg via INTRAVENOUS

## 2014-06-15 MED ORDER — DEXAMETHASONE SODIUM PHOSPHATE 10 MG/ML IJ SOLN
INTRAMUSCULAR | Status: DC | PRN
  Administered 2014-06-15: 10 mg via INTRAVENOUS

## 2014-06-15 MED ORDER — CIPROFLOXACIN IN D5W 400 MG/200ML IV SOLN
400.0000 mg | INTRAVENOUS | Status: AC
Start: 2014-06-15 — End: 2014-06-15
  Administered 2014-06-15: 400 mg via INTRAVENOUS
  Filled 2014-06-15: qty 200

## 2014-06-15 SURGICAL SUPPLY — 27 items
BAG DRAIN URO-CYSTO SKYTR STRL (DRAIN) ×3 IMPLANT
BAG DRN UROCATH (DRAIN) ×1
CANISTER SUCT LVC 12 LTR MEDI- (MISCELLANEOUS) IMPLANT
CATH FOLEY 2WAY SLVR  5CC 16FR (CATHETERS)
CATH FOLEY 2WAY SLVR  5CC 24FR (CATHETERS) ×2
CATH FOLEY 2WAY SLVR 5CC 16FR (CATHETERS) IMPLANT
CATH FOLEY 2WAY SLVR 5CC 24FR (CATHETERS) ×1 IMPLANT
CLOTH BEACON ORANGE TIMEOUT ST (SAFETY) ×3 IMPLANT
DRAPE CAMERA CLOSED 9X96 (DRAPES) ×3 IMPLANT
ELECT LOOP MED HF 24F 12D CBL (CLIP) ×3 IMPLANT
ELECT REM PT RETURN 9FT ADLT (ELECTROSURGICAL) ×3
ELECTRODE REM PT RTRN 9FT ADLT (ELECTROSURGICAL) ×1 IMPLANT
GLOVE BIO SURGEON STRL SZ7 (GLOVE) ×3 IMPLANT
GLOVE INDICATOR 7.5 STRL GRN (GLOVE) ×3 IMPLANT
GLOVE SURG SS PI 8.0 STRL IVOR (GLOVE) ×3 IMPLANT
GOWN PREVENTION PLUS LG XLONG (DISPOSABLE) IMPLANT
GOWN STRL REIN XL XLG (GOWN DISPOSABLE) IMPLANT
GOWN STRL REUS W/ TWL XL LVL3 (GOWN DISPOSABLE) ×1 IMPLANT
GOWN STRL REUS W/TWL XL LVL3 (GOWN DISPOSABLE) ×6 IMPLANT
IV NS IRRIG 3000ML ARTHROMATIC (IV SOLUTION) ×6 IMPLANT
NDL SAFETY ECLIPSE 18X1.5 (NEEDLE) IMPLANT
NEEDLE HYPO 18GX1.5 SHARP (NEEDLE)
NEEDLE HYPO 22GX1.5 SAFETY (NEEDLE) IMPLANT
NS IRRIG 500ML POUR BTL (IV SOLUTION) IMPLANT
PACK CYSTO (CUSTOM PROCEDURE TRAY) ×3 IMPLANT
SYR 20CC LL (SYRINGE) ×3 IMPLANT
WATER STERILE IRR 3000ML UROMA (IV SOLUTION) ×3 IMPLANT

## 2014-06-15 NOTE — Anesthesia Preprocedure Evaluation (Addendum)
Anesthesia Evaluation  Patient identified by MRN, date of birth, ID band Patient awake    Reviewed: Allergy & Precautions, H&P , NPO status , Patient's Chart, lab work & pertinent test results  Airway Mallampati: II  TM Distance: >3 FB Neck ROM: Full    Dental no notable dental hx. (+)    Pulmonary neg pulmonary ROS,  breath sounds clear to auscultation  Pulmonary exam normal       Cardiovascular hypertension, Pt. on medications Rhythm:Regular Rate:Normal     Neuro/Psych negative neurological ROS  negative psych ROS   GI/Hepatic negative GI ROS, Neg liver ROS,   Endo/Other  negative endocrine ROS  Renal/GU Renal disease  negative genitourinary   Musculoskeletal negative musculoskeletal ROS (+)   Abdominal (+) + obese,   Peds negative pediatric ROS (+)  Hematology negative hematology ROS (+)   Anesthesia Other Findings   Reproductive/Obstetrics negative OB ROS                           Anesthesia Physical Anesthesia Plan  ASA: II  Anesthesia Plan: General   Post-op Pain Management:    Induction: Intravenous  Airway Management Planned: LMA  Additional Equipment:   Intra-op Plan:   Post-operative Plan: Extubation in OR  Informed Consent: I have reviewed the patients History and Physical, chart, labs and discussed the procedure including the risks, benefits and alternatives for the proposed anesthesia with the patient or authorized representative who has indicated his/her understanding and acceptance.   Dental advisory given  Plan Discussed with: CRNA  Anesthesia Plan Comments:         Anesthesia Quick Evaluation

## 2014-06-15 NOTE — Anesthesia Postprocedure Evaluation (Signed)
  Anesthesia Post-op Note  Patient: [name]  Procedure(s) Performed: Procedure(s) (LRB): CYSTOSCOPY CLOT EVACUATION  (N/A) TRANSURETHRAL RESECTION OF BLADDER TUMOR WITH GYRUS (TURBT-GYRUS) (N/A)  Patient Location: PACU  Anesthesia Type: General  Level of Consciousness: awake and alert   Airway and Oxygen Therapy: Patient Spontanous Breathing  Post-op Pain: mild  Post-op Assessment: Post-op Vital signs reviewed, Patient's Cardiovascular Status Stable, Respiratory Function Stable, Patent Airway and No signs of Nausea or vomiting  Last Vitals:  Filed Vitals:   06/15/14 1345  BP: 122/81  Pulse: 67  Temp:   Resp: 16    Post-op Vital Signs: stable   Complications: No apparent anesthesia complications

## 2014-06-15 NOTE — Interval H&P Note (Signed)
History and Physical Interval Note:  06/15/2014 11:42 AM  [name]  has presented today for surgery, with the diagnosis of HEMATURA WITH CLOTS  The various methods of treatment have been discussed with the patient and family. After consideration of risks, benefits and other options for treatment, the patient has consented to  Procedure(s): CYSTOSCOPY CLOT EVACUATION AND BIOPSY FULGURATION (N/A) as a surgical intervention .  The patient's history has been reviewed, patient examined, no change in status, stable for surgery.  I have reviewed the patient's chart and labs.  Questions were answered to the patient's satisfaction.     Blossie Raffel J

## 2014-06-15 NOTE — Brief Op Note (Signed)
06/15/2014  12:42 PM  PATIENT:  [name]  63 y.o. male  PRE-OPERATIVE DIAGNOSIS:  HEMATURA WITH CLOTS  POST-OPERATIVE DIAGNOSIS:  4cm Right Lateral bladder tumor.  PROCEDURE:  Procedure(s): CYSTOSCOPY CLOT EVACUATION AND TURBT 2-5 cm tumor (N/A)  SURGEON:  Surgeon(s) and Role:    * Malka So, MD - Primary  PHYSICIAN ASSISTANT:   ASSISTANTS: none   ANESTHESIA:   general  EBL:     BLOOD ADMINISTERED:none  DRAINS: Urinary Catheter (Foley)   LOCAL MEDICATIONS USED:  NONE  SPECIMEN:  Source of Specimen:  bladder tumor chips  DISPOSITION OF SPECIMEN:  PATHOLOGY  COUNTS:  YES  TOURNIQUET:  * No tourniquets in log *  DICTATION: .Other Dictation: Dictation Number [id number]  PLAN OF CARE: Discharge to home after PACU  PATIENT DISPOSITION:  PACU - hemodynamically stable.   Delay start of Pharmacological VTE agent (>24hrs) due to surgical blood loss or risk of bleeding: yes

## 2014-06-15 NOTE — Transfer of Care (Signed)
Immediate Anesthesia Transfer of Care Note  Patient: [name]  Procedure(s) Performed: Procedure(s): CYSTOSCOPY CLOT EVACUATION  (N/A) TRANSURETHRAL RESECTION OF BLADDER TUMOR WITH GYRUS (TURBT-GYRUS) (N/A)  Patient Location: PACU  Anesthesia Type:General  Level of Consciousness: awake, alert , oriented and patient cooperative  Airway & Oxygen Therapy: Patient Spontanous Breathing and Patient connected to nasal cannula oxygen  Post-op Assessment: Report given to PACU RN and Post -op Vital signs reviewed and stable  Post vital signs: Reviewed and stable  Complications: No apparent anesthesia complications

## 2014-06-15 NOTE — Discharge Instructions (Addendum)
Cystoscopy, Care After Refer to this sheet in the next few weeks. These instructions provide you with information on caring for yourself after your procedure. Your caregiver may also give you more specific instructions. Your treatment has been planned according to current medical practices, but problems sometimes occur. Call your caregiver if you have any problems or questions after your procedure. HOME CARE INSTRUCTIONS  Things you can do to ease any discomfort after your procedure include:  Drinking enough water and fluids to keep your urine clear or pale yellow.  Taking a warm bath to relieve any burning feelings. SEEK IMMEDIATE MEDICAL CARE IF:   You have an increase in blood in your urine.  You notice blood clots in your urine.  You have difficulty passing urine.  You have the chills.  You have abdominal pain.  You have a fever or persistent symptoms for more than 2-3 days.  You have a fever and your symptoms suddenly get worse. MAKE SURE YOU:   Understand these instructions.  Will watch your condition.  Will get help right away if you are not doing well or get worse. Document Released: 02/21/2005 Document Revised: 04/06/2013 Document Reviewed: 01/26/2012 [facility] Patient Information 2015 [place], [place]. This information is not intended to replace advice given to you by your health care provider. Make sure you discuss any questions you have with your health care provider. Foley Catheter Care A Foley catheter is a soft, flexible tube that is placed into the bladder to drain urine. A Foley catheter may be inserted if:  You leak urine or are not able to control when you urinate (urinary incontinence).  You are not able to urinate when you need to (urinary retention).  You had prostate surgery or surgery on the genitals.  You have certain medical conditions, such as multiple sclerosis, dementia, or a spinal cord injury. If you are going home with a Foley catheter in place,  follow the instructions below. TAKING CARE OF THE CATHETER 1. Wash your hands with soap and water. 2. Using mild soap and warm water on a clean washcloth:  Clean the area on your body closest to the catheter insertion site using a circular motion, moving away from the catheter. Never wipe toward the catheter because this could sweep bacteria up into the urethra and cause infection.  Remove all traces of soap. Pat the area dry with a clean towel. For males, reposition the foreskin. 3. Attach the catheter to your leg so there is no tension on the catheter. Use adhesive tape or a leg strap. If you are using adhesive tape, remove any sticky residue left behind by the previous tape you used. 4. Keep the drainage bag below the level of the bladder, but keep it off the floor. 5. Check throughout the day to be sure the catheter is working and urine is draining freely. Make sure the tubing does not become kinked. 6. Do not pull on the catheter or try to remove it. Pulling could damage internal tissues. TAKING CARE OF THE DRAINAGE BAGS You will be given two drainage bags to take home. One is a large overnight drainage bag, and the other is a smaller leg bag that fits underneath clothing. You may wear the overnight bag at any time, but you should never wear the smaller leg bag at night. Follow the instructions below for how to empty, change, and clean your drainage bags. Emptying the Drainage Bag You must empty your drainage bag when it is  - full  or at least 2-3 times a day. 1. Wash your hands with soap and water. 2. Keep the drainage bag below your hips, below the level of your bladder. This stops urine from going back into the tubing and into your bladder. 3. Hold the dirty bag over the toilet or a clean container. 4. Open the pour spout at the bottom of the bag and empty the urine into the toilet or container. Do not let the pour spout touch the toilet, container, or any other surface. Doing so can  place bacteria on the bag, which can cause an infection. 5. Clean the pour spout with a gauze pad or cotton ball that has rubbing alcohol on it. 6. Close the pour spout. 7. Attach the bag to your leg with adhesive tape or a leg strap. 8. Wash your hands well. Changing the Drainage Bag Change your drainage bag once a month or sooner if it starts to smell bad or look dirty. Below are steps to follow when changing the drainage bag. 1. Wash your hands with soap and water. 2. Pinch off the rubber catheter so that urine does not spill out. 3. Disconnect the catheter tube from the drainage tube at the connection valve. Do not let the tubes touch any surface. 4. Clean the end of the catheter tube with an alcohol wipe. Use a different alcohol wipe to clean the end of the drainage tube. 5. Connect the catheter tube to the drainage tube of the clean drainage bag. 6. Attach the new bag to the leg with adhesive tape or a leg strap. Avoid attaching the new bag too tightly. 7. Wash your hands well. Cleaning the Drainage Bag 1. Wash your hands with soap and water. 2. Wash the bag in warm, soapy water. 3. Rinse the bag thoroughly with warm water. 4. Fill the bag with a solution of white vinegar and water (1 cup vinegar to 1 qt warm water [.2 L vinegar to 1 L warm water]). Close the bag and soak it for 30 minutes in the solution. 5. Rinse the bag with warm water. 6. Hang the bag to dry with the pour spout open and hanging downward. 7. Store the clean bag (once it is dry) in a clean plastic bag. 8. Wash your hands well. PREVENTING INFECTION  Wash your hands before and after handling your catheter.  Take showers daily and wash the area where the catheter enters your body. Do not take baths. Replace wet leg straps with dry ones, if this applies.  Do not use powders, sprays, or lotions on the genital area. Only use creams, lotions, or ointments as directed by your caregiver.  For females, wipe from front to  back after each bowel movement.  Drink enough fluids to keep your urine clear or pale yellow unless you have a fluid restriction.  Do not let the drainage bag or tubing touch or lie on the floor.  Wear cotton underwear to absorb moisture and to keep your skin drier. SEEK MEDICAL CARE IF:   Your urine is cloudy or smells unusually bad.  Your catheter becomes clogged.  You are not draining urine into the bag or your bladder feels full.  Your catheter starts to leak. SEEK IMMEDIATE MEDICAL CARE IF:   You have pain, swelling, redness, or pus where the catheter enters the body.  You have pain in the abdomen, legs, lower back, or bladder.  You have a fever.  You see blood fill the catheter, or your urine  is pink or red.  You have nausea, vomiting, or chills.  Your catheter gets pulled out. MAKE SURE YOU:   Understand these instructions.  Will watch your condition.  Will get help right away if you are not doing well or get worse. Document Released: 08/04/2005 Document Revised: 12/19/2013 Document Reviewed: 07/26/2012 [facility] Patient Information 2015 [place], [place]. This information is not intended to replace advice given to you by your health care provider. Make sure you discuss any questions you have with your health care provider.  Please hold the aspirin for 1 week   Post Anesthesia Home Care Instructions  Activity: Get plenty of rest for the remainder of the day. A responsible adult should stay with you for 24 hours following the procedure.  For the next 24 hours, DO NOT: -Drive a car -Paediatric nurse -Drink alcoholic beverages -Take any medication unless instructed by your physician -Make any legal decisions or sign important papers.  Meals: Start with liquid foods such as gelatin or soup. Progress to regular foods as tolerated. Avoid greasy, spicy, heavy foods. If nausea and/or vomiting occur, drink only clear liquids until the nausea and/or vomiting subsides.  Call your physician if vomiting continues.  Special Instructions/Symptoms: Your throat may feel dry or sore from the anesthesia or the breathing tube placed in your throat during surgery. If this causes discomfort, gargle with warm salt water. The discomfort should disappear within 24 hours.

## 2014-06-15 NOTE — Op Note (Signed)
Diagnosis:  Right lateral wall bladder cancer.  Procedure:  Instillation of Mitomycin C.  Surgeon: Jeffie Pollock.  Comp: None.  Indications:  Mr. Sandlin was found to have a 4cm superficial appearing bladder tumor at the time of his clot evacuation.  The tumor was resected.  After the procedure I spoke to the patient's wife and reviewed the risks of [facility] instillation and explained its use as a standard of care for reducing tumor recurrence.  I discussed the risk of systemic absorption, chemical cystitis, dystrophic calcification and bladder contracture.      Procedure:  The patient was in PACU on the stretcher.  He was instill with 40mg  [facility] in 58ml of diluent.  His foley was plugged and the [facility] was left indwelling for 60 minutes.   The bladder was drained at that time and the patient was discharged with a foley.

## 2014-06-15 NOTE — Anesthesia Procedure Notes (Signed)
Procedure Name: LMA Insertion Date/Time: 06/15/2014 12:12 PM Performed by: Wanita Chamberlain Pre-anesthesia Checklist: Patient identified, Timeout performed, Emergency Drugs available, Suction available and Patient being monitored Patient Re-evaluated:Patient Re-evaluated prior to inductionOxygen Delivery Method: Circle system utilized Preoxygenation: Pre-oxygenation with 100% oxygen Intubation Type: IV induction LMA: LMA inserted LMA Size: 5.0 Number of attempts: 1 Airway Equipment and Method: Bite block Placement Confirmation: positive ETCO2 and breath sounds checked- equal and bilateral Tube secured with: Tape Dental Injury: Teeth and Oropharynx as per pre-operative assessment

## 2014-06-15 NOTE — Progress Notes (Signed)
RN taught patient and wife about foley catheter removal in the am. Both were able to return demonstrate the procedure. Also told pt and wife if they had any problems/questions or concerns to call urology office. C.Anav Lammert,RN

## 2014-06-20 ENCOUNTER — Encounter (HOSPITAL_BASED_OUTPATIENT_CLINIC_OR_DEPARTMENT_OTHER): Payer: Self-pay | Admitting: Urology

## 2014-06-21 NOTE — Op Note (Signed)
NAMEJAHZIAH, [name] NO.:  [id number]  MEDICAL RECORD NO.:  [id number]  LOCATION:                                 FACILITY:  PHYSICIAN:  Marshall Cork. Jeffie Pollock, M.D.    DATE OF BIRTH:  1950/12/26  DATE OF PROCEDURE:  06/15/2014 DATE OF DISCHARGE:  06/15/2014                              OPERATIVE REPORT   PROCEDURE: 1. Cystoscopy with evacuation of clots. 2. Transurethral resection of 2-5 cm bladder tumor.  PREOPERATIVE DIAGNOSIS:  Gross hematuria with clots.  POSTOPERATIVE DIAGNOSIS:  Gross hematuria with clots with a 4-cm right lateral wall bladder tumor.  SURGEON:  Marshall Cork. Jeffie Pollock, M.D.  ANESTHESIA:  General.  SPECIMEN:  Bladder tumor chips.  DRAINS:  A 22-French Foley catheter.  BLOOD LOSS:  Minimal.  COMPLICATIONS:  None.  INDICATIONS:  [name] is a 63 year old white male, who recently presented with gross hematuria with intermittent clot passage. Noncontrast CT revealed a hyperechoic material at the right bladder base, and office cystoscopy revealed fresh and old clots with some adherence to the right lateral wall, but no obvious tumors were noted. He was brought in today for clot evacuation and fulguration with possible biopsy.  FINDINGS AND PROCEDURE:  He was given Cipro.  He was taken to the operating room, where general anesthetic was then induced. He was placed in lithotomy position and fitted with PAS hose.  His perineum and genitalia were prepped with Betadine solution.  He was draped in usual sterile fashion, for the inside he got Cipro.  Cystoscopy was performed using a 22-French scope, and 12 and 70-degree lenses.  Examination revealed a normal urethra.  The external sphincter was intact.  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  Examination of bladder revealed a number of large old clots in the bladder with bloody urine.  These clots were then evacuated out with a [place]y  syringe.  Once clot evacuation had been completed, I then noted a 4-cm papillary tumor with some satellite lesions on the right lateral wall with active bleeding from the center.  The remainder of the bladder had no tumors. She did have moderate to severe trabeculation with some small diverticula.  The ureteral orifices were unremarkable and were away from the tumor.  Once thorough inspection had been performed, a 28-French continuous flow resectoscope sheath was placed with the use of a visual obturator.  This was replaced with [name] handle with a 12-degree lens in a bipolar loop.  Saline was used as the irrigant.  I had paralytic agent given because of the location of the tumor on the right lateral wall.  I then gently resected the lesion.  There was some minor obturator reflex, but with pedal tapping I was able to avoid any bladder wall perforation.  I was able to resect the tumor including some underlying muscle without the entry into fat.  Once the bulk of the tumor had been resected, the surrounding mucosa was generously fulgurated leaving a defect of approximately 4-5 cm on the right lateral wall of the bladder.  Once hemostasis was achieved, the specimen was evacuated and collected for  pathologic evaluation.  Final inspection revealed no active bleeding and no retained material.  The cystoscope was removed and a 22-French Foley catheter was inserted.  The balloon was filled with 10 mL of sterile fluid.  The catheter was irrigated with clear return and placed to continuous irrigation.  The patient was taken down from lithotomy position.  His anesthetic was reversed.  He was moved to recovery room in stable condition.  There were no complications.  I will discuss instillation of mitomycin C with his wife, and that may be considered prior to his discharge today.     Marshall Cork. Jeffie Pollock, M.D.     JJW/MEDQ  D:  06/15/2014  T:  06/15/2014  Job:  [id number]  cc:   Jeneen Rinks L.  [place], M.D. Fax: [phone]

## 2014-12-14 ENCOUNTER — Telehealth: Payer: Self-pay | Admitting: Medical Oncology

## 2014-12-14 NOTE — Telephone Encounter (Signed)
I left a message with [name] asking him to call me regarding his referral to the [facility] 11/29/2014.   Cira Rue, RN, BSN, [place]  [phone]  Fax [phone]

## 2014-12-14 NOTE — Telephone Encounter (Signed)
I called pt to introduce myself as the Prostate Nurse Navigator and the Coordinator of the Prostate [place].  1. I confirmed with the patient he is aware of his referral to the clinic 5/13 arriving at 7:30am.  2. I discussed the format of the clinic and the physicians he will be seeing that day. We dicussed in length the reasons we see men in the clinic. He discussed with me his history of nephrotic syndrome and his bladder lesion and treatment.  3. Pt is aware of clinic location. He has been to [facility] with a family member. He is aware of my office number and how to contact me.  4. I confirmed his address and informed him I would be mailing a packet of information and forms to be completed. I asked him to bring them with him the day of his appointment.   He voiced understanding of the above. I asked him to call me if he has any questions or concerns regarding his appointments or the forms he needs to complete.

## 2014-12-22 DIAGNOSIS — C61 Malignant neoplasm of prostate: Secondary | ICD-10-CM | POA: Insufficient documentation

## 2014-12-22 NOTE — Progress Notes (Signed)
Radiation Oncology         [phone] ________________________________  Multidisciplinary Prostate Cancer Clinic  Initial Radiation Oncology Consultation  Name: [name] MRN: [id number]  Date: 12/29/2014  DOB: 1950-11-30  JI:[id number] Theda Sers, MD  Raynelle Bring, MD   REFERRING PHYSICIAN: Raynelle Bring, MD  DIAGNOSIS: 64 y.o. gentleman with stage T1c adenocarcinoma of the prostate with a Gleason's score of 3+3 and a PSA of 9.94.    ICD-9-CM ICD-10-CM   1. Malignant neoplasm of prostate 185 C61     HISTORY OF PRESENT ILLNESS::[name] is a 64 y.o. gentleman. He was seen in urology with Dr. Jeffie Pollock for hematuria with a 4 cm bladder tumor and was noted to have an elevated PSA of 5.12 on 06/14/14.  He had TURBT for a high grade papillary tumor without stromal/muscle invasion.  He got BCG after.  Digital rectal examination was performed at that time revealing no nodules.  PSA was re-checked 09/28/14 and found to be 9.94.  The patient proceeded to transrectal ultrasound with 12 biopsies of the prostate on 11/14/14.  The prostate volume measured 37.35 cc.  Out of 12 core biopsies,2 were positive.  The maximum Gleason score was 3+3, and this was seen in the distribution below.    The patient reviewed the biopsy results with his urologist and he has kindly been referred today to the multidisciplinary prostate cancer clinic for presentation of pathology and radiology studies in our conference for discussion of potential radiation treatment options and clinical evaluation.  PREVIOUS RADIATION THERAPY: No  PAST MEDICAL HISTORY:  has a past medical history of Hypertension; Hyperlipidemia; History of melanoma excision; Blood clot in bladder; Hematuria; Frequency of urination; Urgency of urination; nephrotic syndrome; History of renal vein thrombosis; History of eye injury; At risk for sleep apnea; Prostate cancer; Elevated prostate specific antigen (PSA); Malignant neoplasm of lateral  wall of urinary bladder; Gross hematuria; Personal history of urinary disorder; and GERD (gastroesophageal reflux disease).    PAST SURGICAL HISTORY: Past Surgical History  Procedure Laterality Date  . Melanoma excision  2010    right forearm  . Cholecystectomy  1989  . Inguinal hernia repair Right 05-26-2011  . Cystoscopy with biopsy N/A 06/15/2014    Procedure: CYSTOSCOPY CLOT EVACUATION ;  Surgeon: Malka So, MD;  Location: [facility];  Service: Urology;  Laterality: N/A;  . Transurethral resection of bladder tumor with gyrus (turbt-gyrus) N/A 06/15/2014    Procedure: TRANSURETHRAL RESECTION OF BLADDER TUMOR WITH GYRUS (TURBT-GYRUS);  Surgeon: Malka So, MD;  Location: [facility];  Service: Urology;  Laterality: N/A;  . Prostate biopsy      FAMILY HISTORY: family history includes Breast cancer in his sister; Heart disease in his mother; Lung cancer in his father; Lupus in his sister.  SOCIAL HISTORY:  reports that he has never smoked. He has never used smokeless tobacco. He reports that he does not drink alcohol or use illicit drugs.  ALLERGIES: Doxycycline calcium  MEDICATIONS:  Current Outpatient Prescriptions  Medication Sig Dispense Refill  . aspirin 325 MG tablet Take 325 mg by mouth daily.      . diazepam (VALIUM) 10 MG tablet Take 10 mg by mouth every 6 (six) hours as needed for anxiety.    Marland Kitchen HYDROcodone-acetaminophen (NORCO) 5-325 MG per tablet Take 1 tablet by mouth every 6 (six) hours as needed for moderate pain. 15 tablet 0  . phenazopyridine (PYRIDIUM) 200 MG tablet Take 1 tablet (200 mg  total) by mouth 3 (three) times daily as needed for pain. 15 tablet 0  . simvastatin (ZOCOR) 40 MG tablet Take 40 mg by mouth every evening.     . Telmisartan-Amlodipine (TWYNSTA) 80-5 MG TABS Take 0.5 tablets by mouth every evening. Patient taking 1/2 tablet daily.     No current facility-administered medications for this encounter.    REVIEW OF  SYSTEMS:  A 15 point review of systems is documented in the electronic medical record. This was obtained by the nursing staff. However, I reviewed this with the patient to discuss relevant findings and make appropriate changes.  A comprehensive review of systems was negative..  The patient completed an IPSS and IIEF questionnaire.    PHYSICAL EXAM: This patient is in no acute distress.  He is alert and oriented.  He exhibits no respiratory distress or labored breathing.  He appears neurologically intact.  His mood is pleasant.  His affect is appropriate.  Please note the digital rectal exam findings described above.  KPS = 100  100 - Normal; no complaints; no evidence of disease. 90   - Able to carry on normal activity; minor signs or symptoms of disease. 80   - Normal activity with effort; some signs or symptoms of disease. 47   - Cares for self; unable to carry on normal activity or to do active work. 60   - Requires occasional assistance, but is able to care for most of his personal needs. 50   - Requires considerable assistance and frequent medical care. 77   - Disabled; requires special care and assistance. 12   - Severely disabled; hospital admission is indicated although death not imminent. 88   - Very sick; hospital admission necessary; active supportive treatment necessary. 10   - Moribund; fatal processes progressing rapidly. 0     - Dead  Karnofsky DA, Abelmann [place], Craver LS and Burchenal [facility] [phone]) The use of the nitrogen mustards in the palliative treatment of carcinoma: with particular reference to bronchogenic carcinoma Cancer 1 634-56   LABORATORY DATA:  Lab Results  Component Value Date   WBC 6.3 05/20/2011   HGB 15.3 06/15/2014   HCT 45.0 06/15/2014   MCV 91.3 05/20/2011   PLT 165 05/20/2011   Lab Results  Component Value Date   NA 141 06/15/2014   K 4.1 06/15/2014   CL 106 06/15/2014   CO2 27 05/20/2011   Lab Results  Component Value Date   ALT 21 05/18/2009     AST 23 05/18/2009   ALKPHOS 66 05/18/2009   BILITOT 0.9 05/18/2009     RADIOGRAPHY: No results found.    IMPRESSION: This gentleman is a 64 y.o. gentleman with stage T1c adenocarcinoma of the prostate with a Gleason's score of 3+3 and a PSA of 9.94.  His T-Stage, Gleason's Score, and PSA put him into the favorable risk group.  Accordingly he is eligible for a variety of potential treatment options including active surveillance, prostatectomy, external radiation, or prostate seed implant.  PLAN:Today I reviewed the findings and workup thus far.  We discussed the natural history of prostate cancer.  We reviewed the the implications of T-stage, Gleason's Score, and PSA on decision-making and outcomes in prostate cancer.  We discussed radiation treatment in the management of prostate cancer with regard to the logistics and delivery of external beam radiation treatment as well as the logistics and delivery of prostate brachytherapy.  We compared and contrasted each of these approaches and also compared these  against prostatectomy.  The patient expressed interest in external beam radiotherapy.  I filled out a patient counseling form for him with relevant treatment diagrams and we retained a copy for our records.   The patient would like to proceed with active surveillance.  I will share my findings with Dr. Alinda Money and look forward to following his progress in the near future.     I enjoyed meeting with him today, and will look forward to participating in the care of this very nice gentleman.   I spent 40 minutes face to face with the patient and more than 50% of that time was spent in counseling and/or coordination of care.   This document serves as a record of services personally performed by Tyler Pita, MD. It was created on his behalf by Jeralene Peters, a trained medical scribe. The creation of this record is based on the scribe's personal observations and the provider's statements to them.  This document has been checked and approved by the attending provider.      ------------------------------------------------  Sheral Apley Tammi Klippel, M.D.

## 2014-12-25 ENCOUNTER — Encounter: Payer: Self-pay | Admitting: Radiation Oncology

## 2014-12-26 ENCOUNTER — Encounter: Payer: Self-pay | Admitting: Radiation Oncology

## 2014-12-26 NOTE — Progress Notes (Signed)
GU Location of Tumor / Histology: prostatic adenocarcinoma   If Prostate Cancer, Gleason Score is (3 + 3) and PSA is (9.94)  [name] presented October 2015 with an elevated PSA of 5.12.  Biopsies of prostate (if applicable) revealed:    Past/Anticipated interventions by urology, if any: Cystoscopy with evacuation of clots and transurethral resection of 2-5 cm bladder tumor on 06/15/2014, prostate biopsy  Past/Anticipated interventions by medical oncology, if any: no  Weight changes, if any: no  Bowel/Bladder complaints, if any:    Nausea/Vomiting, if any: no  Pain issues, if any:  no  SAFETY ISSUES:  Prior radiation? no  Pacemaker/ICD? no  Possible current pregnancy? no  Is the patient on methotrexate? no  Current Complaints / other details:  64 year old male. Married with two sons. Prostate volume 37.35 cc. Patient leaning toward active therapy with seeds.

## 2014-12-28 ENCOUNTER — Telehealth: Payer: Self-pay | Admitting: Medical Oncology

## 2014-12-28 NOTE — Telephone Encounter (Signed)
I left message with Mr. Lammert to remind him of appointment in Prostate [facility] 5/13 arriving at 7:30am. I asked him to bring his completed medical forms and I reviewed where we are located.I asked him to call me with  any questions or concerns.   Cira Rue, RN, BSN, [place]  [phone]  Fax [phone]

## 2014-12-29 ENCOUNTER — Encounter: Payer: Self-pay | Admitting: Radiation Oncology

## 2014-12-29 ENCOUNTER — Encounter: Payer: Self-pay | Admitting: *Deleted

## 2014-12-29 ENCOUNTER — Ambulatory Visit
Admission: RE | Admit: 2014-12-29 | Discharge: 2014-12-29 | Disposition: A | Discharge status: Home / Self Care | Payer: BLUE CROSS/BLUE SHIELD | Source: Ambulatory Visit | Attending: Radiation Oncology | Admitting: Radiation Oncology

## 2014-12-29 ENCOUNTER — Encounter: Payer: Self-pay | Admitting: Medical Oncology

## 2014-12-29 ENCOUNTER — Ambulatory Visit (HOSPITAL_BASED_OUTPATIENT_CLINIC_OR_DEPARTMENT_OTHER): Payer: BLUE CROSS/BLUE SHIELD | Admitting: Oncology

## 2014-12-29 VITALS — BP 123/86 | HR 75 | Resp 16 | Ht 70.0 in | Wt 254.0 lb

## 2014-12-29 DIAGNOSIS — Z803 Family history of malignant neoplasm of breast: Secondary | ICD-10-CM | POA: Diagnosis not present

## 2014-12-29 DIAGNOSIS — C679 Malignant neoplasm of bladder, unspecified: Secondary | ICD-10-CM

## 2014-12-29 DIAGNOSIS — C61 Malignant neoplasm of prostate: Secondary | ICD-10-CM | POA: Diagnosis not present

## 2014-12-29 DIAGNOSIS — Z801 Family history of malignant neoplasm of trachea, bronchus and lung: Secondary | ICD-10-CM

## 2014-12-29 HISTORY — DX: Unspecified malignant neoplasm of skin, unspecified: C44.90

## 2014-12-29 HISTORY — DX: Unilateral inguinal hernia, without obstruction or gangrene, not specified as recurrent: K40.90

## 2014-12-29 NOTE — Progress Notes (Signed)
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 Married to [employer]. Two children [place] and [place]. Reports SOB when ambulating up stairs.

## 2014-12-29 NOTE — Progress Notes (Signed)
Reason for Referral: Prostate cancer and bladder cancer.   HPI: 64 year old gentleman currently of [place] where he lived the majority of his life. He is a gentleman with a history of membranous glomerulonephritis diagnosed in 2010. He was treated previously with Cytoxan under the care of nephrology. He was evaluated by a urology after he had a rise in his PSA up to 9.94 as well as a developing symptoms of hematuria. His evaluation for hematuria including a CT scan done in October 2015 which showed no kidney stone but a right bladder base tumor. He underwent a cystoscopy under the care of Dr. Jeffie Pollock on October 2015 and underwent a transurethral resection of a bladder tumor. 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 He subsequently treated her with a BCG intravesicular therapy. He subsequently underwent a repeat cystoscopy in March 2016 and was unremarkable. He subsequently underwent a prostate biopsy on 11/16/2014 and showed a Gleason score 3+3 = 6 in 5% of one core at the left apex and 3+3 equal 65% in another core at the right apex. Patient referred to prostate cancer multidisciplinary clinic for discussion. He is completely asymptomatic at this time. He does not report any headaches, blurry vision, syncope or seizures. He does not report any fevers, chills, sweats, weight loss or appetite changes. He does not report any chest pain, palpitation orthopnea or PND. Does not report any cough or hemoptysis or hematemesis. Does not report any nausea, vomiting, abdominal pain. Does not report any hematochezia or melena. Does not report any frequency urgency or hesitancy. He does not report any skeletal complaints. Remaining review of systems unremarkable.   Past Medical History  Diagnosis Date  . Hypertension   . Hyperlipidemia   . History of melanoma excision     right foreman  . Blood clot in bladder   . Hematuria   . Frequency of urination   .  Urgency of urination   . Hx: nephrotic syndrome   . History of renal vein thrombosis   . History of eye injury     hx vitreous tear 2010  . At risk for sleep apnea     STOP-BANG= 4     SENT TO PCP 06-14-2014  . Prostate cancer   . Elevated prostate specific antigen (PSA)   . Malignant neoplasm of lateral wall of urinary bladder   . Gross hematuria   . Personal history of urinary disorder   . GERD (gastroesophageal reflux disease)   :  Past Surgical History  Procedure Laterality Date  . Melanoma excision  2010    right forearm  . Cholecystectomy  1989  . Inguinal hernia repair Right 05-26-2011  . Cystoscopy with biopsy N/A 06/15/2014    Procedure: CYSTOSCOPY CLOT EVACUATION ;  Surgeon: Malka So, MD;  Location: [facility];  Service: Urology;  Laterality: N/A;  . Transurethral resection of bladder tumor with gyrus (turbt-gyrus) N/A 06/15/2014    Procedure: TRANSURETHRAL RESECTION OF BLADDER TUMOR WITH GYRUS (TURBT-GYRUS);  Surgeon: Malka So, MD;  Location: [facility];  Service: Urology;  Laterality: N/A;  . Prostate biopsy    :   Current outpatient prescriptions:  .  aspirin 325 MG tablet, Take 325 mg by mouth daily.  , Disp: , Rfl:  .  diazepam (VALIUM) 10 MG tablet, Take 10 mg by mouth every 6 (six) hours as needed for anxiety., Disp: , Rfl:  .  HYDROcodone-acetaminophen (NORCO) 5-325 MG per tablet, Take 1  tablet by mouth every 6 (six) hours as needed for moderate pain., Disp: 15 tablet, Rfl: 0 .  phenazopyridine (PYRIDIUM) 200 MG tablet, Take 1 tablet (200 mg total) by mouth 3 (three) times daily as needed for pain., Disp: 15 tablet, Rfl: 0 .  simvastatin (ZOCOR) 40 MG tablet, Take 40 mg by mouth every evening. , Disp: , Rfl:  .  Telmisartan-Amlodipine (TWYNSTA) 80-5 MG TABS, Take 0.5 tablets by mouth every evening. Patient taking 1/2 tablet daily., Disp: , Rfl: :  Allergies  Allergen Reactions  . Doxycycline Calcium Hives, Nausea Only  and Other (See Comments)    fever  :  Family History  Problem Relation Age of Onset  . Heart disease Mother   . Lung cancer Father   . Breast cancer Sister   . Lupus Sister   :  History   Social History  . Marital Status: Married    Spouse Name: N/A  . Number of Children: N/A  . Years of Education: N/A   Occupational History  . Not on file.   Social History Main Topics  . Smoking status: Never Smoker   . Smokeless tobacco: Never Used  . Alcohol Use: No     Comment: rare  . Drug Use: No  . Sexual Activity: Yes   Other Topics Concern  . Not on file   Social History Narrative  :  Pertinent items are noted in HPI.  Exam: ECOG 0 There were no vitals taken for this visit. General appearance: alert and cooperative Head: Normocephalic, without obvious abnormality Throat: lips, mucosa, and tongue normal; teeth and gums normal Neck: no adenopathy Back: negative Resp: clear to auscultation bilaterally Chest wall: no tenderness Cardio: regular rate and rhythm, S1, S2 normal, no murmur, click, rub or gallop GI: soft, non-tender; bowel sounds normal; no masses,  no organomegaly Extremities: extremities normal, atraumatic, no cyanosis or edema Pulses: 2+ and symmetric Skin: Skin color, texture, turgor normal. No rashes or lesions Lymph nodes: Cervical, supraclavicular, and axillary nodes normal.    Assessment and Plan:    63 year old gentleman with the following issues:  1. Superficial bladder tumor diagnosed in October 2015. He is status post transurethral resection of a bladder tumor followed by BCG treatment. The natural course of this disease was discussed today and it is reasonable to consider his previous Cytoxan exposure at a potential risk factor. This will certainly increases risk for bladder tumor recurrence. He understands superficial bladder tumors can be retreated with BCG or mitomycin-C but if he develops a muscle invasive disease he might require more  aggressive therapy. That aggressive therapy could include a cystectomy and possible neoadjuvant chemotherapy. But for the time being, active surveillance with repeat cystoscopies is the standard of care.  2. Low-grade the prostate cancer presented with a Gleason score 3+3 = 6 in 5% of 2 cores. His PSA was 9.94. The natural course of this disease was discussed today with the patient and his wife. His case was discussed and the prostate cancer multidisciplinary clinic including review from pathology and radiology. It was felt that observation and surveillance would be a better option for him for many reasons.  Given the fact that he has low risk cancer is very possible any treatment will be potentially overtreating his cancer that might have very low potential. Also, given his bladder cancer possibility and possible recurrence, any treatments for his prostate cancer might jeopardize his potential bladder cancer treatment in the future.  He also understands if he  develops bladder cancer recurrence that might require surgical resection, a prostatectomy will be included in the procedure and which could offer treatment for this cancer down the line.  This was discussed extensively with the patient and his wife and all her questions were answered to their satisfaction.

## 2014-12-29 NOTE — [facility] Oncology Navigator Note (Addendum)
                               Care Plan Summary  Name: Mr. Dib DOB: 07-Feb-1951   Your Medical Team:   Urologist -  Dr. Raynelle Bring, [facility]  Radiation Oncologist - Dr. Tyler Pita, [facility]   Medical Oncologist - Dr. Zola Button, [place]  Recommendations: 1) Active Surveillance 2) PSA   3) Follow MRI  * These recommendations are based on information available as of today's consult.      Recommendations may change depending on the results of further tests or exams. Next Steps: 1) Dr. Ralene Muskrat office will schedule PSA and follow MRI   When appointments need to be scheduled, you will be contacted by [facility] and/or [facility].  Questions?  Please do not hesitate to call Cira Rue, RN, BSN, CRNI at [phone] any questions or concerns.  Shirlean Mylar is your Oncology Nurse Navigator and is available to assist you while you're receiving your medical care at [facility].

## 2014-12-29 NOTE — Progress Notes (Signed)
[place] Prostate Psychosocial Distress Screening Clinical Social Work  Clinical Social Work met with [name] and his wife, Kennyth Lose at [facility] to offer support, introduce self and review distress screening protocol. The patient scored a 7 on the Psychosocial Distress Thermometer which indicates sever distress. [name] and wife live together in their home. 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 He tries to manage this by walking. He currently is interested in losing weight and a more healthy diet, as a result, food has been a concern. CSW notified RN of these concerns. CSW reviewed options for additional support; Prostate Support Group and [name] & Family Support Team. [name] eager to attend next Prostate Support Group and CSW notified facilitator. CSW discussed common emotions patients experience and coping techniques. [name] aware to reach out to CSW as needed and was provided contact information.   [place] Social Work met with [name] and his wife, Kennyth Lose at [facility] to offer support, introduce self and review distress screening protocol. The patient scored a 7 on the Psychosocial Distress Thermometer which indicates sever distress. [name] and wife live together in their home. 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 He tries to manage this by walking. He currently is interested in losing weight and a more healthy diet, as a result, food has been a concern. CSW notified RN of these concerns. CSW reviewed options for additional support; Prostate Support Group and [name] & Family Support Team. [name] eager to attend next Prostate Support Group and CSW notified facilitator. CSW discussed common emotions patients experience and coping techniques. [name] aware to reach out to CSW as needed and was provided contact information.   12/29/2014  Screening Type Initial Screening  Distress experienced in past week (1-10) 7  Practical problem type  Work/school;Food  Emotional problem type Adjusting to illness  Physical Problem type Skin dry/itchy  Physician notified of physical symptoms Yes  Referral to clinical social work Yes  Referral to dietition Yes     Clinical Social Worker follow up needed: No.  If yes, follow up plan:  Loren Racer, [place]  [facility] Phone: [phone] Fax: [phone]

## 2014-12-29 NOTE — Consult Note (Signed)
Chief Complaint  Prostate Cancer   Reason For Visit  Reason for consult: To discuss treatment options for prostate cancer and to specifically consider surgical treatment with robotic surgery. Physician requesting consult: Dr. Irine Seal PCP: Dr. 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 [name]), renal vein thrombosis, pulmonary embolus (no longer on anticoagulation), hypertension, and hypercholesterolemia who presented to Dr. Jeffie Pollock last fall with gross hematuria.  He was found to have a 4 cm bladder tumor and an elevated PSA of 5.12.  He underwent a TURBT on 06/15/14 that confirmed high grade urothelial carcinoma that was felt to at least be stage Ta although there was some question about whether there was focal lamina propria invasion.  He was treated with a 6 week induction course of intravesical BCG and surveillance cystoscopy in March 2016 did not demonstrate recurrence.  His PSA had been rechecked in February 2016 after his BCG and had further increased to 9.94.  This prompted a prostate needle biopsy on 11/14/14 that confirmed Gleason 3+3=6 adenocarcinoma in 2 out of 12 biopsy cores.  He has no family history of prostate cancer. His renal function has been stable with a Cr around 1.0.  TNM stage: cT1c Nx Mx PSA: 9.94 Gleason score: 3+3=6 Biopsy (11/14/14): 2/12 cores positive - L apex (5%), R apex (5%) Prostate volume: 37.4 cc PSAD: 0.26 (0.14 if using PSA from November prior to BCG)  Nomogram OC disease: 59% EPE: 40% SVI: 1% LNI: 1% PFS (surgery): 92% at 5 years, 86% 10 years  Urinary function: IPSS is 4. Erectile function: SHIM score is 24   Past Medical History  1. History of Benign prostate hyperplasia (N40.0)  2. 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 History of heartburn (Z87.898)  4. History of hypercholesterolemia (Z86.39)  5. History of hypertension (Z86.79)  6. History of pulmonary embolism (Z86.711)  7. History of Malignant neoplasm of lateral wall of bladder (C67.2)  Surgical History  1. History of Bladder Injection Of Cancer Treatment  2. History of Cholecystectomy  3. History of Cystoscopy With Fulguration Medium Lesion (2-5cm)  4. History of Cystourethroscopy With Irrigation And Evacuation Of Clots  5. History of Destruction Of Malignant Lesion  6. History of Hernia Repair  Current Meds  1. Aspirin 325 MG Oral Tablet;  Therapy: (Recorded:28Oct2015) to Recorded  2. Simvastatin 40 MG Oral Tablet;  Therapy: (Recorded:28Oct2015) to Recorded  3. Twynsta 80-5 MG Oral Tablet; takes 1/2 tab daily;  Therapy: (Recorded:28Oct2015) to Recorded  Allergies  1. Vibramycin CAPS  Family History  1. Family history of congestive heart failure (Z82.49) : Mother  2. Family history of hypertension (Z82.49) : Father  3. Family history of lung cancer (Z80.1) : Father  4. Family history of malignant neoplasm of breast (Z80.3) : Mother, Sister  5. Family history of systemic lupus erythematosus (Z82.69)  6. Family history of Hematuria : Mother  32. Family history of Hypertension : Father  36. Family history of Hypertension : Mother  Social History   Alcohol Use   Marital History - Currently Married   Mother deceased   Never a smoker   Denied: History of Tobacco Use  Review of Systems AU Complete-Male: Constitutional, skin, eye, otolaryngeal, hematologic/lymphatic, cardiovascular, pulmonary, endocrine, musculoskeletal, gastrointestinal, neurological and psychiatric  system(s) were reviewed and pertinent findings if present are noted and are otherwise negative.  Constitutional: no night sweats and no recent weight loss.  Cardiovascular: no leg swelling.    Physical Exam Constitutional: Well  nourished and well developed . No acute distress.    Results/Data  I have reviewed his medical records, PSA results, pathology slides, and outside medical records.  We have discussed his pathology during the multidisciplinary conference.     Assessment  1. Prostate cancer (C61)  2. Malignant neoplasm of overlapping sites of bladder (C67.8)  Discussion/Summary  1.  Prostate cancer: I had a detailed discussion with [name] and his wife today regarding management of his prostate cancer.  He has seen both Dr. Tammi Klippel and Dr. Alen Blew in consultation already this morning.  We specifically addressed his prostate cancer in the context of his membranous nephropathy and bladder cancer.   The patient was counseled about the natural history of prostate cancer and the standard treatment options that are available for prostate cancer. It was explained to him how his age and life expectancy, clinical stage, Gleason score, and PSA affect his prognosis, the decision to proceed with additional staging studies, as well as how that information influences recommended treatment strategies. We discussed the roles for active surveillance, radiation therapy, surgical therapy, androgen deprivation, as well as ablative therapy options for the treatment of prostate cancer as appropriate to his individual cancer situation. We discussed the risks and benefits of these options with regard to their impact on cancer control and also in terms of potential adverse events, complications, and impact on quiality of life particularly related to urinary, bowel, and sexual function. The patient was encouraged to ask questions throughout the discussion today and all questions were answered to his stated satisfaction. In addition, the patient was provided with and/or directed to appropriate resources and literature for further education about prostate cancer and treatment options.   He understands a considering the very low risk nature of his  prostate cancer that he does appear to meet very low risk criteria per NCCN guidelines.  He understands that active surveillance is a very reasonable approach and somewhat consider the preferred approach to his situation.  We also discussed other options of curative intent including surgery and radiation therapy albeit with the risk of urinary, sexual, and bowel dysfunction.    We also discussed his bladder cancer.  He is a nonsmoker and understands that his risk for bladder cancer may be simply related to age for genetic predisposition.  However, his main risk factor for development of bladder cancer will be the administration of Cytoxan.  He understands that he is at a high risk for recurrence of bladder cancer although this risk may be modified as he is no longer receiving Cytoxan.  He understands that the recommended ongoing management would include surveillance cystoscopy every 3 months along with maintenance BCG therapy.   We also discussed his membranous nephropathy and I reviewed Dr. Deterding's recent notes including his most recent note from 12/22/14.  I reassured [name] that he has no evidence of systemic malignancy and appears to have very early stage bladder cancer and very early stage/low volume prostate cancer.  He understands I know of no reports in the literature that would suggest early, low stage bladder cancer or prostate cancer to be an etiology for or to cause progression of membranous nephropathy.  I therefore did not recommend that he change his decision regarding treatment of his prostate cancer or  bladder cancer based on this diagnosis.  If he does require immunosuppressive therapy in the future, this will need to be taken into account considering that he has been receiving immunotherapy intravesically for his bladder cancer.  We discussed management options for approaching his prostate cancer.  He understands that surgical treatment or radiation therapy for curative intent would  likely be successful in providing cure.  However, these treatments both, with significant risks to his quality of life related to his urinary function and erectile function.  Currently, it would appear that he has a non-life-threatening and potentially clinically insignificant prostate cancer.  The other option would be proceeding with active surveillance.  I did raise the issue of his PSA that has significantly increased when last checked.  This is likely artifactual and related to his BCG therapy although I did recommend he consider having a repeat PSA at this time to further follow the trend of the PSA.  It was the recommendation of the multidisciplinary group today that he proceed with active surveillance and to consider an MRI of the prostate followed by a more extensive prostate biopsy in late summer or early fall.  He understands that if his prostate cancer was found to be upgraded, we may make a different recommendation.  This will allow more time to further assess his response to treatment for his bladder cancer.  He understands that any definitive therapy of his prostate cancer whether it be surgery or radiation therapy could potentially adversely effect definitive treatment of his bladder cancer should he develop muscle invasive disease.   He expressed his understanding and agrees to proceed with this approach.  I will communicate these recommendations to Dr. Jeffie Pollock.   Cc: Dr. Kelton Pillar Dr. Irine Seal Dr. Zola Button Dr. Tyler Pita  A total of 65 minutes were spent in the overall care of the patient today with 65 minutes in direct face to face consultation.    Signatures Electronically signed by : Raynelle Bring, M.D.; Dec 29 2014 12:27PM EST

## 2015-01-01 NOTE — [facility] Oncology Navigator Note (Signed)
I met with Mr. [name] and his wife Kennyth Lose in the Prostate [place].  They both stated that they were very impressed with the physicians and the information they received. Patient is going to remain under active surveillance and follow up with MRI. I discussed with them my role as the navigator and asked them to please call me with any questions or concerns. He is really interested in joining the Prostate support group and he plans on joining the group Monday evening.

## 2015-01-12 ENCOUNTER — Telehealth: Payer: Self-pay | Admitting: Medical Oncology

## 2015-01-12 ENCOUNTER — Encounter: Payer: Self-pay | Admitting: Medical Oncology

## 2015-01-12 NOTE — Telephone Encounter (Signed)
Pt called and left a message asking if Dr. Alinda Money would be willing to call him to discuss the HIFU treatment for prostate cancer. He heard about this at the Prostate support group and since he has bladder cancer he just needs to discuss if this would even be an option. I sent a message to Dr. Alinda Money and will contact the patient when I hear a reply.

## 2015-01-12 NOTE — Progress Notes (Signed)
Oncology Nurse Navigator Documentation  Oncology Nurse Navigator Flowsheets 01/12/2015  Navigator Encounter Type Telephone  Treatment Phase Other- met Mr. Rawl in the Prostate [facility] 12/29/2014. He then attended the Prostate support groups where there was discussion among the attendees of various treatments that are available. There was discussion about HIFU for prostate cancer. Mr. Locklin called this morning and would like to discuss this option in further detail with Dr. Alinda Money. Since he has bladder cancer he is not sure this would be an option for him. I informed him that I sent Dr. Alinda Money a message regarding his request. I will call him as soon as I hear from Dr. Alinda Money.  Time Spent with Patient 15

## 2015-01-17 ENCOUNTER — Encounter: Payer: Self-pay | Admitting: Medical Oncology

## 2015-01-17 NOTE — Progress Notes (Signed)
Oncology Nurse Navigator Documentation  Oncology Nurse Navigator Flowsheets 01/12/2015 01/16/2015  Navigator Encounter Type Telephone Telephone  Treatment Phase Other -  Barriers/Navigation Needs - Education- Pt heard about the HIFU procedure in the Prostate support group. I had asked Dr. Alinda Money if he would call the patient and discuss this procedure with the patient. Dr. Alinda Money notified me that he called patient and discussed with the patient. [name] voiced interest in genetics testing. I have him the information and asked him to call Tiffany to schedule this appointment. He travels for business and this way he can schedule when he is in town. I asked him to all me with any further questions or concerns. He voiced understanding.  Time Spent with Patient 15 15

## 2015-03-27 ENCOUNTER — Other Ambulatory Visit: Payer: Self-pay | Admitting: Urology

## 2015-03-27 DIAGNOSIS — C61 Malignant neoplasm of prostate: Secondary | ICD-10-CM

## 2015-04-03 ENCOUNTER — Telehealth: Payer: Self-pay | Admitting: Medical Oncology

## 2015-04-03 NOTE — Telephone Encounter (Signed)
Oncology Nurse Navigator Documentation  Oncology Nurse Navigator Flowsheets 01/12/2015 01/16/2015 04/03/2015  Navigator Encounter Type Telephone Telephone Telephone;3 month- Left a message requesting a return call. Following up 3 month Prostate [place] visit.  Barriers/Navigation Needs - Education -  Time Spent with Patient - - 15

## 2015-04-19 ENCOUNTER — Telehealth: Payer: Self-pay | Admitting: Medical Oncology

## 2015-04-19 NOTE — Telephone Encounter (Signed)
Oncology Nurse Navigator Documentation  Oncology Nurse Navigator Flowsheets 01/16/2015 04/03/2015 04/19/2015  Navigator Encounter Type Telephone Telephone;3 month Telephone;3 month-Mr. Yonker states he is doing well. He recently has a scoping for his bladder cancer and before this procedure they drew his PSA. He was happy to report that it had decreased to 5.23. He states this confirms what he told in the Prostate [place] regarding active surveillance. He has another bladder scoping scheduled in Sept. He is hoping for another great report. He voiced how much he enjoyed the the support group. If he is not town he will be present. I asked him to call me with any concerns and I will continue to follow him.  Barriers/Navigation Needs Education - No barriers at this time  Support Groups/Services - - Prostate;Friends and Family  Time Spent with Patient - 90 93

## 2015-04-20 ENCOUNTER — Other Ambulatory Visit: Payer: Self-pay | Admitting: Urology

## 2015-04-20 ENCOUNTER — Ambulatory Visit (HOSPITAL_COMMUNITY)
Admission: RE | Admit: 2015-04-20 | Discharge: 2015-04-20 | Disposition: A | Discharge status: Home / Self Care | Payer: BLUE CROSS/BLUE SHIELD | Source: Ambulatory Visit | Attending: Urology | Admitting: Urology

## 2015-04-20 DIAGNOSIS — Z135 Encounter for screening for eye and ear disorders: Secondary | ICD-10-CM | POA: Diagnosis present

## 2015-04-20 DIAGNOSIS — C61 Malignant neoplasm of prostate: Secondary | ICD-10-CM

## 2015-04-20 DIAGNOSIS — Z1810 Retained metal fragments, unspecified: Secondary | ICD-10-CM | POA: Diagnosis present

## 2015-04-20 LAB — POCT I-STAT CREATININE: CREATININE: 1.2 mg/dL (ref 0.61–1.24)

## 2015-09-10 ENCOUNTER — Other Ambulatory Visit (HOSPITAL_COMMUNITY): Payer: Self-pay | Admitting: Physician Assistant

## 2015-09-10 DIAGNOSIS — R0789 Other chest pain: Secondary | ICD-10-CM

## 2015-09-26 ENCOUNTER — Telehealth (HOSPITAL_COMMUNITY): Payer: Self-pay

## 2015-09-26 NOTE — Telephone Encounter (Signed)
Encounter complete. 

## 2015-09-28 ENCOUNTER — Ambulatory Visit (HOSPITAL_COMMUNITY)
Admission: RE | Admit: 2015-09-28 | Discharge: 2015-09-28 | Disposition: A | Discharge status: Home / Self Care | Payer: BLUE CROSS/BLUE SHIELD | Source: Ambulatory Visit | Attending: Cardiovascular Disease | Admitting: Cardiovascular Disease

## 2015-09-28 DIAGNOSIS — R0789 Other chest pain: Secondary | ICD-10-CM | POA: Insufficient documentation

## 2015-09-28 LAB — EXERCISE TOLERANCE TEST
CHL CUP RESTING HR STRESS: 82 {beats}/min
CSEPED: 7 min
Estimated workload: 8.5 METS
MPHR: 158 {beats}/min
Peak HR: 160 {beats}/min
Percent HR: 101 %
RPE: 16

## 2015-12-19 DIAGNOSIS — C672 Malignant neoplasm of lateral wall of bladder: Secondary | ICD-10-CM | POA: Diagnosis not present

## 2015-12-19 DIAGNOSIS — C61 Malignant neoplasm of prostate: Secondary | ICD-10-CM | POA: Diagnosis not present

## 2015-12-19 DIAGNOSIS — Z0000 Encounter for general adult medical examination without abnormal findings: Secondary | ICD-10-CM | POA: Diagnosis not present

## 2016-01-28 DIAGNOSIS — I129 Hypertensive chronic kidney disease with stage 1 through stage 4 chronic kidney disease, or unspecified chronic kidney disease: Secondary | ICD-10-CM | POA: Diagnosis not present

## 2016-01-28 DIAGNOSIS — N022 Recurrent and persistent hematuria with diffuse membranous glomerulonephritis: Secondary | ICD-10-CM | POA: Diagnosis not present

## 2016-01-28 DIAGNOSIS — E785 Hyperlipidemia, unspecified: Secondary | ICD-10-CM | POA: Diagnosis not present

## 2016-01-28 DIAGNOSIS — N181 Chronic kidney disease, stage 1: Secondary | ICD-10-CM | POA: Diagnosis not present

## 2016-03-26 ENCOUNTER — Other Ambulatory Visit: Payer: Self-pay | Admitting: Urology

## 2016-03-26 DIAGNOSIS — Z8551 Personal history of malignant neoplasm of bladder: Secondary | ICD-10-CM | POA: Diagnosis not present

## 2016-03-26 DIAGNOSIS — R3121 Asymptomatic microscopic hematuria: Secondary | ICD-10-CM | POA: Diagnosis not present

## 2016-03-26 DIAGNOSIS — C670 Malignant neoplasm of trigone of bladder: Secondary | ICD-10-CM | POA: Diagnosis not present

## 2016-03-26 DIAGNOSIS — C61 Malignant neoplasm of prostate: Secondary | ICD-10-CM | POA: Diagnosis not present

## 2016-03-26 MED ORDER — SODIUM CHLORIDE 0.9 % IV SOLN: 50.0000 mg | Freq: Once | INTRAVENOUS | Status: DC

## 2016-03-28 ENCOUNTER — Encounter (HOSPITAL_COMMUNITY): Payer: Self-pay

## 2016-03-28 NOTE — Anesthesia Preprocedure Evaluation (Addendum)
Anesthesia Evaluation  Patient identified by MRN, date of birth, ID band Patient awake    Reviewed: Allergy & Precautions, NPO status , Patient's Chart, lab work & pertinent test results  Airway Mallampati: II   Neck ROM: Full    Dental  (+) Teeth Intact   Pulmonary neg pulmonary ROS, PE PE in past   breath sounds clear to auscultation       Cardiovascular hypertension, Pt. on medications  Rhythm:Regular  Neg Stress 09/2015   Neuro/Psych negative neurological ROS  negative psych ROS   GI/Hepatic negative GI ROS, Neg liver ROS, GERD  ,  Endo/Other  negative endocrine ROS  Renal/GU negative Renal ROSBladder tumor   Prostate CA    Musculoskeletal negative musculoskeletal ROS (+)   Abdominal   Peds negative pediatric ROS (+)  Hematology negative hematology ROS (+)   Anesthesia Other Findings   Reproductive/Obstetrics negative OB ROS                            Anesthesia Physical Anesthesia Plan  ASA: III  Anesthesia Plan: General   Post-op Pain Management:    Induction: Intravenous  Airway Management Planned: Oral ETT  Additional Equipment:   Intra-op Plan:   Post-operative Plan: Extubation in OR  Informed Consent: I have reviewed the patients History and Physical, chart, labs and discussed the procedure including the risks, benefits and alternatives for the proposed anesthesia with the patient or authorized representative who has indicated his/her understanding and acceptance.     Plan Discussed with:   Anesthesia Plan Comments:         Anesthesia Quick Evaluation

## 2016-03-30 ENCOUNTER — Other Ambulatory Visit: Payer: Self-pay

## 2016-03-30 ENCOUNTER — Observation Stay (HOSPITAL_COMMUNITY): Payer: BLUE CROSS/BLUE SHIELD

## 2016-03-30 ENCOUNTER — Emergency Department (HOSPITAL_COMMUNITY): Payer: BLUE CROSS/BLUE SHIELD

## 2016-03-30 ENCOUNTER — Observation Stay (HOSPITAL_COMMUNITY)
Admission: EM | Admit: 2016-03-30 | Discharge: 2016-04-01 | Disposition: A | Discharge status: Home / Self Care | Payer: BLUE CROSS/BLUE SHIELD | Attending: Internal Medicine | Admitting: Internal Medicine

## 2016-03-30 ENCOUNTER — Encounter (HOSPITAL_COMMUNITY): Payer: Self-pay | Admitting: Emergency Medicine

## 2016-03-30 DIAGNOSIS — Z8551 Personal history of malignant neoplasm of bladder: Secondary | ICD-10-CM | POA: Diagnosis not present

## 2016-03-30 DIAGNOSIS — K219 Gastro-esophageal reflux disease without esophagitis: Secondary | ICD-10-CM | POA: Insufficient documentation

## 2016-03-30 DIAGNOSIS — R55 Syncope and collapse: Principal | ICD-10-CM | POA: Diagnosis present

## 2016-03-30 DIAGNOSIS — Z8546 Personal history of malignant neoplasm of prostate: Secondary | ICD-10-CM | POA: Insufficient documentation

## 2016-03-30 DIAGNOSIS — I10 Essential (primary) hypertension: Secondary | ICD-10-CM | POA: Diagnosis not present

## 2016-03-30 DIAGNOSIS — R42 Dizziness and giddiness: Secondary | ICD-10-CM | POA: Diagnosis not present

## 2016-03-30 DIAGNOSIS — Z85820 Personal history of malignant melanoma of skin: Secondary | ICD-10-CM | POA: Insufficient documentation

## 2016-03-30 DIAGNOSIS — N289 Disorder of kidney and ureter, unspecified: Secondary | ICD-10-CM | POA: Diagnosis not present

## 2016-03-30 DIAGNOSIS — Z7982 Long term (current) use of aspirin: Secondary | ICD-10-CM | POA: Insufficient documentation

## 2016-03-30 DIAGNOSIS — H538 Other visual disturbances: Secondary | ICD-10-CM | POA: Insufficient documentation

## 2016-03-30 DIAGNOSIS — E785 Hyperlipidemia, unspecified: Secondary | ICD-10-CM | POA: Insufficient documentation

## 2016-03-30 DIAGNOSIS — Z86718 Personal history of other venous thrombosis and embolism: Secondary | ICD-10-CM | POA: Diagnosis not present

## 2016-03-30 LAB — URINALYSIS, ROUTINE W REFLEX MICROSCOPIC
GLUCOSE, UA: NEGATIVE mg/dL
KETONES UR: 15 mg/dL — AB
LEUKOCYTES UA: NEGATIVE
Nitrite: NEGATIVE
Specific Gravity, Urine: 1.025 (ref 1.005–1.030)
pH: 5.5 (ref 5.0–8.0)

## 2016-03-30 LAB — URINE MICROSCOPIC-ADD ON: WBC, UA: NONE SEEN WBC/hpf (ref 0–5)

## 2016-03-30 LAB — TROPONIN I: Troponin I: <0.03 ng/mL (ref <0.03)

## 2016-03-30 LAB — CBG MONITORING, ED: Glucose-Capillary: 85 mg/dL (ref 65–99)

## 2016-03-30 LAB — CBC
HCT: 43.9 % (ref 39.0–52.0)
Hemoglobin: 15.2 g/dL (ref 13.0–17.0)
MCH: 33.2 pg (ref 26.0–34.0)
MCHC: 34.6 g/dL (ref 30.0–36.0)
MCV: 95.9 fL (ref 78.0–100.0)
PLATELETS: 188 10*3/uL (ref 150–400)
RBC: 4.58 MIL/uL (ref 4.22–5.81)
RDW: 13 % (ref 11.5–15.5)
WBC: 6.6 10*3/uL (ref 4.0–10.5)

## 2016-03-30 LAB — BASIC METABOLIC PANEL
Anion gap: 9 (ref 5–15)
BUN: 14 mg/dL (ref 6–20)
CALCIUM: 9 mg/dL (ref 8.9–10.3)
CHLORIDE: 103 mmol/L (ref 101–111)
CO2: 24 mmol/L (ref 22–32)
CREATININE: 1.27 mg/dL — AB (ref 0.61–1.24)
GFR calc Af Amer: >60 mL/min (ref >60)
GFR, EST NON AFRICAN AMERICAN: 59 mL/min — AB (ref >60)
Glucose, Bld: 90 mg/dL (ref 65–99)
Potassium: 4.2 mmol/L (ref 3.5–5.1)
SODIUM: 136 mmol/L (ref 135–145)

## 2016-03-30 MED ORDER — ASPIRIN 325 MG PO TABS
325.0000 mg | ORAL_TABLET | Freq: Every day | ORAL | Status: DC
  Filled 2016-03-30 (×2): qty 1

## 2016-03-30 MED ORDER — AMLODIPINE BESYLATE 5 MG PO TABS
2.5000 mg | ORAL_TABLET | Freq: Every evening | ORAL | Status: DC
  Administered 2016-03-31 (×2): 2.5 mg via ORAL
  Filled 2016-03-30 (×2): qty 1

## 2016-03-30 MED ORDER — SIMVASTATIN 40 MG PO TABS: 40.0000 mg | ORAL_TABLET | Freq: Every evening | ORAL | Status: DC

## 2016-03-30 MED ORDER — IRBESARTAN 150 MG PO TABS
150.0000 mg | ORAL_TABLET | Freq: Every evening | ORAL | Status: DC
  Administered 2016-03-31: 150 mg via ORAL
  Filled 2016-03-30 (×2): qty 1

## 2016-03-30 MED ORDER — TELMISARTAN-AMLODIPINE 80-5 MG PO TABS: 0.5000 | ORAL_TABLET | Freq: Every evening | ORAL | Status: DC

## 2016-03-30 MED ORDER — SODIUM CHLORIDE 0.9% FLUSH
3.0000 mL | Freq: Two times a day (BID) | INTRAVENOUS | Status: DC
  Administered 2016-03-31: 3 mL via INTRAVENOUS

## 2016-03-30 MED ORDER — ENOXAPARIN SODIUM 40 MG/0.4ML ~~LOC~~ SOLN: 40.0000 mg | SUBCUTANEOUS | Status: DC

## 2016-03-30 NOTE — ED Provider Notes (Signed)
[place] DEPT Provider Note   CSN: [id number] Arrival date & time: 03/30/16  1710  First Provider Contact:  First MD Initiated Contact with Patient 03/30/16 1756        History   Chief Complaint Chief Complaint  Patient presents with  . Near Syncope    HPI [name] is a 65 y.o. male.Patient was sitting at his computer 9 AM today and developed pressure in his head at bilateral temporal area. This afternoon at 5 PM he became lightheaded and suffered near syncopal event. Lightheadedness lasted several minutes. He did not fall or injure himself. He is presently asymptomatic without treatment. Other associated symptoms include sweatiness and feeling as if his vision went dark. No treatment prior to coming here. No chest pain no shortness of breath no nausea or vomiting No other associated symptoms.  HPI  Past Medical History:  Diagnosis Date  . At risk for sleep apnea    STOP-BANG= 4     SENT TO PCP 06-14-2014  . Blood clot in bladder   . Elevated prostate specific antigen (PSA)   . Frequency of urination   . GERD (gastroesophageal reflux disease)   . Gross hematuria   . Hematuria   . History of eye injury    hx vitreous tear 2010  . History of melanoma excision    right foreman  . History of renal vein thrombosis   . Hx: nephrotic syndrome   . Hyperlipidemia   . Hypertension   . Inguinal hernia   . Malignant neoplasm of lateral wall of urinary bladder ([place])   . Personal history of urinary disorder   . Prostate cancer ([place])   . Skin cancer   . Urgency of urination     Patient Active Problem List   Diagnosis Date Noted  . Malignant neoplasm of prostate ([place]) 12/22/2014  . Bladder cancer ([place]) 06/15/2014  . Inguinal hernia 03/27/2011    Past Surgical History:  Procedure Laterality Date  . CHOLECYSTECTOMY  1989  . CYSTOSCOPY WITH BIOPSY N/A 06/15/2014   Procedure: CYSTOSCOPY CLOT EVACUATION ;  Surgeon: Malka So, MD;  Location: [facility];  Service: Urology;  Laterality: N/A;  . INGUINAL HERNIA REPAIR Right 05-26-2011  . MELANOMA EXCISION  2010   right forearm  . PROSTATE BIOPSY    . TRANSURETHRAL RESECTION OF BLADDER TUMOR WITH GYRUS (TURBT-GYRUS) N/A 06/15/2014   Procedure: TRANSURETHRAL RESECTION OF BLADDER TUMOR WITH GYRUS (TURBT-GYRUS);  Surgeon: Malka So, MD;  Location: [facility];  Service: Urology;  Laterality: N/A;   Membranous glomerulonephritis ,prostate cancer, bladder cancer    Home Medications    Prior to Admission medications   Medication Sig Start Date End Date Taking? Authorizing Provider  aspirin 325 MG tablet Take 325 mg by mouth daily.      Historical Provider, MD  simvastatin (ZOCOR) 40 MG tablet Take 40 mg by mouth every evening.     Historical Provider, MD  Telmisartan-Amlodipine (TWYNSTA) 80-5 MG TABS Take 0.5 tablets by mouth every evening. Patient taking 1/2 tablet daily.    Historical Provider, MD    Family History Family History  Problem Relation Age of Onset  . Lung cancer Father   . Heart disease Mother   . Breast cancer Sister   . Lupus Sister     Social History Social History  Substance Use Topics  . Smoking status: Never Smoker  . Smokeless tobacco: Never Used  . Alcohol use Yes  Comment: rare     Allergies   Vibramycin [doxycycline calcium]   Review of Systems Review of Systems  Constitutional: Positive for diaphoresis.  HENT: Negative.   Eyes: Positive for visual disturbance.       Temporary feeling of "everything going dark." Now resolved  Respiratory: Negative.   Cardiovascular: Negative.   Gastrointestinal: Negative.   Musculoskeletal: Negative.   Skin: Negative.   Allergic/Immunologic: Positive for immunocompromised state.       Currently under evaluation for prostate cancer and bladder cancer  Neurological: Positive for light-headedness.  Psychiatric/Behavioral: Negative.      Physical Exam Updated Vital Signs BP  133/85   Pulse 71   Temp 98.8 F (37.1 C) (Oral)   Resp 14   Ht 5\' 10"  (1.778 m)   Wt 260 lb (117.9 kg)   SpO2 96%   BMI 37.31 kg/m   Physical Exam  Constitutional: He appears well-developed and well-nourished.  HENT:  Head: Normocephalic and atraumatic.  Eyes: Conjunctivae are normal. Pupils are equal, round, and reactive to light.  Neck: Neck supple. No tracheal deviation present. No thyromegaly present.  Cardiovascular: Normal rate and regular rhythm.   No murmur heard. Pulmonary/Chest: Effort normal and breath sounds normal.  Abdominal: Soft. Bowel sounds are normal. He exhibits no distension. There is no tenderness.  Obese  Musculoskeletal: Normal range of motion. He exhibits no edema or tenderness.  Neurological: He is alert. Coordination normal.  Skin: Skin is warm and dry. No rash noted.  Psychiatric: He has a normal mood and affect.  Nursing note and vitals reviewed.    ED Treatments / Results  Labs (all labs ordered are listed, but only abnormal results are displayed) Labs Reviewed  BASIC METABOLIC PANEL - Abnormal; Notable for the following:       Result Value   Creatinine, Ser 1.27 (*)    GFR calc non Af Amer 59 (*)    All other components within normal limits  CBC  URINALYSIS, ROUTINE W REFLEX MICROSCOPIC (NOT AT [facility])  TROPONIN I  CBG MONITORING, ED    EKG  EKG Interpretation  Date/Time:  Sunday March 30 2016 17:22:35 EDT Ventricular Rate:  91 PR Interval:  166 QRS Duration: 98 QT Interval:  384 QTC Calculation: 472 R Axis:   -20 Text Interpretation:  Normal sinus rhythm Normal ECG No significant change since last tracing Confirmed by Winfred Leeds  MD, Federick Levene [id number]) on 03/30/2016 6:16:13 PM       Radiology No results found.  Procedures Procedures (including critical care time)  Medications Ordered in ED Medications - No data to display   Initial Impression / Assessment and Plan / ED Course  I have reviewed the triage vital signs and the  nursing notes.  Pertinent labs & imaging results that were available during my care of the patient were reviewed by me and considered in my medical decision making (see chart for details).  Clinical Course  8 PM patient resting comfortably. Asymptomatic   Dr. Alcario Drought consulted and will see patient in the hospital. Results for orders placed or performed during the hospital encounter of [id number]  Basic metabolic panel  Result Value Ref Range   Sodium 136 135 - 145 mmol/L   Potassium 4.2 3.5 - 5.1 mmol/L   Chloride 103 101 - 111 mmol/L   CO2 24 22 - 32 mmol/L   Glucose, Bld 90 65 - 99 mg/dL   BUN 14 6 - 20 mg/dL   Creatinine, Ser 1.27 (H) 0.61 -  1.24 mg/dL   Calcium 9.0 8.9 - 10.3 mg/dL   GFR calc non Af Amer 59 (L) >60 mL/min   GFR calc Af Amer >60 >60 mL/min   Anion gap 9 5 - 15  CBC  Result Value Ref Range   WBC 6.6 4.0 - 10.5 K/uL   RBC 4.58 4.22 - 5.81 MIL/uL   Hemoglobin 15.2 13.0 - 17.0 g/dL   HCT 43.9 39.0 - 52.0 %   MCV 95.9 78.0 - 100.0 fL   MCH 33.2 26.0 - 34.0 pg   MCHC 34.6 30.0 - 36.0 g/dL   RDW 13.0 11.5 - 15.5 %   Platelets 188 150 - 400 K/uL  Urinalysis, Routine w reflex microscopic  Result Value Ref Range   Color, Urine YELLOW YELLOW   APPearance CLOUDY (A) CLEAR   Specific Gravity, Urine 1.025 1.005 - 1.030   pH 5.5 5.0 - 8.0   Glucose, UA NEGATIVE NEGATIVE mg/dL   Hgb urine dipstick MODERATE (A) NEGATIVE   Bilirubin Urine SMALL (A) NEGATIVE   Ketones, ur 15 (A) NEGATIVE mg/dL   Protein, ur >300 (A) NEGATIVE mg/dL   Nitrite NEGATIVE NEGATIVE   Leukocytes, UA NEGATIVE NEGATIVE  Troponin I  Result Value Ref Range   Troponin I <0.03 <0.03 ng/mL  Urine microscopic-add on  Result Value Ref Range   Squamous Epithelial / LPF 0-5 (A) NONE SEEN   WBC, UA NONE SEEN 0 - 5 WBC/hpf   RBC / HPF 0-5 0 - 5 RBC/hpf   Bacteria, UA FEW (A) NONE SEEN   Casts HYALINE CASTS (A) NEGATIVE  CBG monitoring, ED  Result Value Ref Range   Glucose-Capillary 85 65 - 99  mg/dL   Ct Head Wo Contrast  Result Date: 03/30/2016 CLINICAL DATA:  Lightheaded and blurred vision 6 hours ago. Near syncope. EXAM: CT HEAD WITHOUT CONTRAST TECHNIQUE: Contiguous axial images were obtained from the base of the skull through the vertex without intravenous contrast. COMPARISON:  None. FINDINGS: There is mild generalized brain atrophy. The brainstem and cerebellum are normal. Cerebral hemispheres are normal except for some changes of chronic appearing small vessel disease of the deep white matter. No cortical or large vessel territory infarction. No mass lesion, hemorrhage, hydrocephalus or extra-axial collection. The calvarium is unremarkable. Sinuses, middle ears and mastoids are clear. There is atherosclerotic calcification of the major vessels at the base of the brain. IMPRESSION: No acute finding by CT. Chronic appearing small vessel changes of the cerebral hemispheric white matter. Atherosclerotic calcification of the major vessels at the base of the brain. Electronically Signed   By: Nelson Chimes M.D.   On: 03/30/2016 18:48   Final Clinical Impressions(s) / ED Diagnoses  Plan 23 hour observation telemetry Final diagnoses:  None   Diagnoses #1 near syncope #2 renal insufficiency New Prescriptions New Prescriptions   No medications on file     Orlie Dakin, MD 03/30/16 2006

## 2016-03-30 NOTE — ED Notes (Signed)
Patient transported to CT 

## 2016-03-30 NOTE — ED Notes (Signed)
Became diaphoretic in triage; BP dropped to 84/62

## 2016-03-30 NOTE — ED Notes (Signed)
Patient transported to X-ray then MRI

## 2016-03-30 NOTE — ED Notes (Signed)
Pt back from CT in no apparent distress  

## 2016-03-30 NOTE — ED Notes (Signed)
Upon getting a low BP reading in triage area patient was brought back to the room.  MD and RN at bedside where IV fluids were started per MD.  Patient is asymptomatic at this time.  Patient states he has been sweaty off and on all day.  Will continue to monitor.

## 2016-03-30 NOTE — ED Notes (Signed)
Meal given to patient.  Patient tolerated food and liquids.  Will continue to monitor

## 2016-03-30 NOTE — H&P (Signed)
History and Physical    [name] [id number] DOB: 1951-07-20 DOA: 03/30/2016   PCP: Osborne Casco, MD Chief Complaint:  Chief Complaint  Patient presents with  . Near Syncope    HPI: [name] is a 65 y.o. male with medical history significant of HTN, bladder CA scheduled for TURBT procedure on the 17th.  Patient presents to the ED after a near syncopal episode.  He developed headache this morning described as bi-temporal pressure.  He took his BP a couple of times and it was running very high for him (0000000 systolic).  This evening around 5pm, he was fixing dinner, and had near syncopal event.  Immediately after event he took his BP and it was running [id number] systolic.  ED Course: EKG unremarkable, CT head just shows some atherosclerotic calcifications at base arteries in brain, patient being admitted for eval.  Review of Systems: As per HPI otherwise 10 point review of systems negative.    Past Medical History:  Diagnosis Date  . At risk for sleep apnea    STOP-BANG= 4     SENT TO PCP 06-14-2014  . Blood clot in bladder   . Elevated prostate specific antigen (PSA)   . Frequency of urination   . GERD (gastroesophageal reflux disease)   . Gross hematuria   . Hematuria   . History of eye injury    hx vitreous tear 2010  . History of melanoma excision    right foreman  . History of renal vein thrombosis   . Hx: nephrotic syndrome   . Hyperlipidemia   . Hypertension   . Inguinal hernia   . 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 Personal history of urinary disorder   . Prostate cancer ([place])   . Skin cancer   . Urgency of urination     Past Surgical History:  Procedure Laterality Date  . CHOLECYSTECTOMY  1989  . CYSTOSCOPY WITH BIOPSY N/A 06/15/2014   Procedure: CYSTOSCOPY CLOT EVACUATION ;  Surgeon: Malka So, MD;  [facility];  Service: Urology;  Laterality: N/A;  . INGUINAL HERNIA REPAIR Right  05-26-2011  . MELANOMA EXCISION  2010   right forearm  . PROSTATE BIOPSY    . TRANSURETHRAL RESECTION OF BLADDER TUMOR WITH GYRUS (TURBT-GYRUS) N/A 06/15/2014   Procedure: TRANSURETHRAL RESECTION OF BLADDER TUMOR WITH GYRUS (TURBT-GYRUS);  Surgeon: Malka So, MD;  Location: [facility];  Service: Urology;  Laterality: N/A;     reports that he has never smoked. He has never used smokeless tobacco. He reports that he drinks alcohol. He reports that he does not use drugs.  Allergies  Allergen Reactions  . Vibramycin [Doxycycline Calcium] Hives, Nausea Only and Other (See Comments)    fever    Family History  Problem Relation Age of Onset  . Lung cancer Father   . Heart disease Mother   . Breast cancer Sister   . Lupus Sister       Prior to Admission medications   Medication Sig Start Date End Date Taking? Authorizing Provider  aspirin 325 MG tablet Take 325 mg by mouth daily.     Yes Historical Provider, MD  simvastatin (ZOCOR) 40 MG tablet Take 40 mg by mouth every evening.    Yes Historical Provider, MD  Telmisartan-Amlodipine (TWYNSTA) 80-5 MG TABS Take 0.5 tablets by mouth every evening. Patient taking 1/2 tablet daily.   Yes Historical Provider, MD  Physical Exam: Vitals:   03/30/16 1856 03/30/16 1915 03/30/16 2001 03/30/16 2015  BP: 141/95 (!) 160/101  156/92  Pulse: 80 87  87  Resp:  16  26  Temp:   98.4 F (36.9 C)   TempSrc:      SpO2: 96% 97%  95%  Weight:      Height:          Constitutional: NAD, calm, comfortable Eyes: PERRL, lids and conjunctivae normal ENMT: Mucous membranes are moist. Posterior pharynx clear of any exudate or lesions.Normal dentition.  Neck: normal, supple, no masses, no thyromegaly Respiratory: clear to auscultation bilaterally, no wheezing, no crackles. Normal respiratory effort. No accessory muscle use.  Cardiovascular: Regular rate and rhythm, no murmurs / rubs / gallops. No extremity edema. 2+ pedal pulses. No  carotid bruits.  Abdomen: no tenderness, no masses palpated. No hepatosplenomegaly. Bowel sounds positive.  Musculoskeletal: no clubbing / cyanosis. No joint deformity upper and lower extremities. Good ROM, no contractures. Normal muscle tone.  Skin: no rashes, lesions, ulcers. No induration Neurologic: CN 2-12 grossly intact. Sensation intact, DTR normal. Strength 5/5 in all 4.  Psychiatric: Normal judgment and insight. Alert and oriented x 3. Normal mood.    Labs on Admission: I have personally reviewed following labs and imaging studies  CBC:  Recent Labs Lab 03/30/16 1730  WBC 6.6  HGB 15.2  HCT 43.9  MCV 95.9  PLT 0000000   Basic Metabolic Panel:  Recent Labs Lab 03/30/16 1730  NA 136  K 4.2  CL 103  CO2 24  GLUCOSE 90  BUN 14  CREATININE 1.27*  CALCIUM 9.0   GFR: Estimated Creatinine Clearance: 77.6 mL/min (by C-G formula based on SCr of 1.27 mg/dL). Liver Function Tests: No results for input(s): AST, ALT, ALKPHOS, BILITOT, PROT, ALBUMIN in the last 168 hours. No results for input(s): LIPASE, AMYLASE in the last 168 hours. No results for input(s): AMMONIA in the last 168 hours. Coagulation Profile: No results for input(s): INR, PROTIME in the last 168 hours. Cardiac Enzymes:  Recent Labs Lab 03/30/16 1730  TROPONINI <0.03   BNP (last 3 results) No results for input(s): PROBNP in the last 8760 hours. HbA1C: No results for input(s): HGBA1C in the last 72 hours. CBG:  Recent Labs Lab 03/30/16 1737  GLUCAP 85   Lipid Profile: No results for input(s): CHOL, HDL, LDLCALC, TRIG, CHOLHDL, LDLDIRECT in the last 72 hours. Thyroid Function Tests: No results for input(s): TSH, T4TOTAL, FREET4, T3FREE, THYROIDAB in the last 72 hours. Anemia Panel: No results for input(s): VITAMINB12, FOLATE, FERRITIN, TIBC, IRON, RETICCTPCT in the last 72 hours. Urine analysis:    Component Value Date/Time   COLORURINE YELLOW 03/30/2016 1915   APPEARANCEUR CLOUDY (A)  03/30/2016 1915   LABSPEC 1.025 03/30/2016 1915   PHURINE 5.5 03/30/2016 1915   GLUCOSEU NEGATIVE 03/30/2016 1915   HGBUR MODERATE (A) 03/30/2016 1915   BILIRUBINUR SMALL (A) 03/30/2016 1915   KETONESUR 15 (A) 03/30/2016 1915   PROTEINUR >300 (A) 03/30/2016 1915   UROBILINOGEN 1.0 05/16/2009 0839   NITRITE NEGATIVE 03/30/2016 1915   LEUKOCYTESUR NEGATIVE 03/30/2016 1915   Sepsis Labs: @LABRCNTIP (procalcitonin:4,lacticidven:4) )No results found for this or any previous visit (from the past 240 hour(s)).   Radiological Exams on Admission: Ct Head Wo Contrast  Result Date: 03/30/2016 CLINICAL DATA:  Lightheaded and blurred vision 6 hours ago. Near syncope. EXAM: CT HEAD WITHOUT CONTRAST TECHNIQUE: Contiguous axial images were obtained from the base of the skull through the  vertex without intravenous contrast. COMPARISON:  None. FINDINGS: There is mild generalized brain atrophy. The brainstem and cerebellum are normal. Cerebral hemispheres are normal except for some changes of chronic appearing small vessel disease of the deep white matter. No cortical or large vessel territory infarction. No mass lesion, hemorrhage, hydrocephalus or extra-axial collection. The calvarium is unremarkable. Sinuses, middle ears and mastoids are clear. There is atherosclerotic calcification of the major vessels at the base of the brain. IMPRESSION: No acute finding by CT. Chronic appearing small vessel changes of the cerebral hemispheric white matter. Atherosclerotic calcification of the major vessels at the base of the brain. Electronically Signed   By: Nelson Chimes M.D.   On: 03/30/2016 18:48    EKG: Independently reviewed.  Assessment/Plan Principal Problem:   Near syncope Active Problems:   HTN (hypertension)    1. Near syncope - 1. Syncope pathway 2. Tele monitor 3. 2d echo 4. Given unusual HTN today and calcifications of major vessels at base of brain, will get MRI / MRA of head 2. HTN - continue  home meds 3. Bladder tumor - scheduled for surgery on 17th with Dr. Jeffie Pollock   DVT prophylaxis: SCDs (also on daily ASA 325), but concerned because h/o gross hematuria with known bladder ca Code Status: Full Family Communication: Wife at bedside Consults called: None Admission status: Place in [place], [facility] Pager [phone] from 7PM-7AM  If 7AM-7PM, please contact the day physician for the patient [email or web address] Password [facility]  03/30/2016, 8:32 PM

## 2016-03-30 NOTE — ED Notes (Signed)
Attempted to call report. Floor RN unable to accept report.  

## 2016-03-30 NOTE — ED Triage Notes (Signed)
This morning felt temple pressure and felt hot all over. Took BP was 150/100. Called on call MD, who told him to take an additional 1/2 dose of BP medication, took it at 1430.  About 30 minutes prior to arrival (1445), vision went blurry and felt weak. Did not pass out. States feels "shaky" now. Denies pain. Face symmetrical, equal strength bilaterally, speech intact.

## 2016-03-30 NOTE — ED Provider Notes (Signed)
[facility] DEPT Provider Note   CSN: [id number] Arrival date & time: 03/30/16  1710  First Provider Contact:  First MD Initiated Contact with Patient 03/30/16 1756        History   Chief Complaint Chief Complaint  Patient presents with  . Near Syncope    HPI [name] is a 65 y.o. male.  HPI  Past Medical History:  Diagnosis Date  . At risk for sleep apnea    STOP-BANG= 4     SENT TO PCP 06-14-2014  . Blood clot in bladder   . Elevated prostate specific antigen (PSA)   . Frequency of urination   . GERD (gastroesophageal reflux disease)   . Gross hematuria   . Hematuria   . History of eye injury    hx vitreous tear 2010  . History of melanoma excision    right foreman  . History of renal vein thrombosis   . Hx: nephrotic syndrome   . Hyperlipidemia   . Hypertension   . Inguinal hernia   . 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 Personal history of urinary disorder   . Prostate cancer ([place])   . Skin cancer   . Urgency of urination     Patient Active Problem List   Diagnosis Date Noted  . Malignant neoplasm of prostate ([place]) 12/22/2014  . Bladder cancer ([place]) 06/15/2014  . Inguinal hernia 03/27/2011    Past Surgical History:  Procedure Laterality Date  . CHOLECYSTECTOMY  1989  . CYSTOSCOPY WITH BIOPSY N/A 06/15/2014   Procedure: CYSTOSCOPY CLOT EVACUATION ;  Surgeon: Malka So, MD;  [facility]: [facility];  Service: Urology;  Laterality: N/A;  . INGUINAL HERNIA REPAIR Right 05-26-2011  . MELANOMA EXCISION  2010   right forearm  . PROSTATE BIOPSY    . TRANSURETHRAL RESECTION OF BLADDER TUMOR WITH GYRUS (TURBT-GYRUS) N/A 06/15/2014   Procedure: TRANSURETHRAL RESECTION OF BLADDER TUMOR WITH GYRUS (TURBT-GYRUS);  Surgeon: Malka So, MD;  Location: [facility];  Service: Urology;  Laterality: N/A;       Home Medications    Prior to Admission medications   Medication Sig Start Date End  Date Taking? Authorizing Provider  aspirin 325 MG tablet Take 325 mg by mouth daily.      Historical Provider, MD  simvastatin (ZOCOR) 40 MG tablet Take 40 mg by mouth every evening.     Historical Provider, MD  Telmisartan-Amlodipine (TWYNSTA) 80-5 MG TABS Take 0.5 tablets by mouth every evening. Patient taking 1/2 tablet daily.    Historical Provider, MD    Family History Family History  Problem Relation Age of Onset  . Lung cancer Father   . Heart disease Mother   . Breast cancer Sister   . Lupus Sister     Social History Social History  Substance Use Topics  . Smoking status: Never Smoker  . Smokeless tobacco: Never Used  . Alcohol use Yes     Comment: rare     Allergies   Vibramycin [doxycycline calcium]   Review of Systems Review of Systems   Physical Exam Updated Vital Signs BP 112/82 (BP Location: Left Arm)   Pulse 82   Temp 98.8 F (37.1 C) (Oral)   Resp 12   Ht 5\' 10"  (1.778 m)   Wt 260 lb (117.9 kg)   SpO2 99%   BMI 37.31 kg/m   Physical Exam   ED Treatments / Results  Labs (  all labs ordered are listed, but only abnormal results are displayed) Labs Reviewed  BASIC METABOLIC PANEL - Abnormal; Notable for the following:       Result Value   Creatinine, Ser 1.27 (*)    GFR calc non Af Amer 59 (*)    All other components within normal limits  CBC  URINALYSIS, ROUTINE W REFLEX MICROSCOPIC (NOT AT [facility])  TROPONIN I  CBG MONITORING, ED    EKG  EKG Interpretation  Date/Time:  Sunday March 30 2016 17:22:35 EDT Ventricular Rate:  91 PR Interval:  166 QRS Duration: 98 QT Interval:  384 QTC Calculation: 472 R Axis:   -20 Text Interpretation:  Normal sinus rhythm Normal ECG No significant change since last tracing Confirmed by Winfred Leeds  MD, Jaaron Oleson [phone]) on 03/30/2016 6:16:13 PM       Radiology No results found.  Procedures Procedures (including critical care time)  Medications Ordered in ED Medications - No data to  display   Initial Impression / Assessment and Plan / ED Course  I have reviewed the triage vital signs and the nursing notes.  Pertinent labs & imaging results that were available during my care of the patient were reviewed by me and considered in my medical decision making (see chart for details).  Clinical Course    PLEASE DELETE THIS NOTE . IT IS A DUUPLICATE  Final Clinical Impressions(s) / ED Diagnoses   Final diagnoses:  None    New Prescriptions New Prescriptions   No medications on file     Orlie Dakin, MD 03/31/16 [phone]

## 2016-03-31 ENCOUNTER — Observation Stay (HOSPITAL_BASED_OUTPATIENT_CLINIC_OR_DEPARTMENT_OTHER): Payer: BLUE CROSS/BLUE SHIELD

## 2016-03-31 ENCOUNTER — Other Ambulatory Visit (HOSPITAL_COMMUNITY): Payer: BLUE CROSS/BLUE SHIELD

## 2016-03-31 DIAGNOSIS — R55 Syncope and collapse: Secondary | ICD-10-CM | POA: Diagnosis not present

## 2016-03-31 DIAGNOSIS — I10 Essential (primary) hypertension: Secondary | ICD-10-CM | POA: Diagnosis not present

## 2016-03-31 LAB — ECHOCARDIOGRAM COMPLETE
EWDT: 236 ms
FS: 28 % (ref 28–44)
HEIGHTINCHES: 70.5 in
IVS/LV PW RATIO, ED: 1
LA ID, A-P, ES: 48 mm
LA diam end sys: 48 mm
LA diam index: 2.06 cm/m2
LA vol A4C: 62.6 ml
LAVOL: 67.5 mL
LAVOLIN: 29 mL/m2
LDCA: 5.31 cm2
LV PW d: 9 mm — AB (ref 0.6–1.1)
LV e' LATERAL: 8.16 cm/s
LVOTD: 26 mm
Lateral S' vel: 11.7 cm/s
MV Dec: 236
MVPKEVEL: 0.7 m/s
RV TAPSE: 19.3 mm
TDI e' lateral: 8.16
TDI e' medial: 6.85
Weight: 4073.6 oz

## 2016-03-31 LAB — GLUCOSE, CAPILLARY
GLUCOSE-CAPILLARY: 111 mg/dL — AB (ref 65–99)
Glucose-Capillary: 84 mg/dL (ref 65–99)

## 2016-03-31 MED ORDER — ATORVASTATIN CALCIUM 20 MG PO TABS
20.0000 mg | ORAL_TABLET | Freq: Every day | ORAL | Status: DC
  Filled 2016-03-31: qty 1

## 2016-03-31 NOTE — Progress Notes (Signed)
PT Cancellation Note  Patient Details Name: ALMAN NULL MRN: [id number] DOB: [birth year]   Cancelled Treatment:    Reason Eval/Treat Not Completed: PT screened, no needs identified, will sign off   Irwin Brakeman F 03/31/2016, 1:18 PM  Tyr Franca,PT Acute Rehabilitation [phone] (pager)

## 2016-03-31 NOTE — Progress Notes (Signed)
PROGRESS NOTE    [name]  [id number] DOB: 08-28-1950 DOA: 03/30/2016 PCP: Osborne Casco, MD    Brief Narrative:  [name] is a 65 y.o. male with medical history significant of HTN, bladder CA scheduled for TURBT procedure on the 17th.  Patient presents to the ED after a near syncopal episode.  Assessment & Plan:   Principal Problem:   Near syncope Active Problems:   HTN (hypertension)   Near syncope : Probably from taking the extra BP medication.  MRI/ MRA of head and neck unremarkable.  Echocardiogram pending.  Orthostatics negative on admission and today.   Hypertension: better controlled than yesterday.    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     DVT prophylaxis: (Lovenox) Code Status: (Full) Family Communication: wife at bedside.  Disposition Plan: pending echo.   Consultants:   None.    Procedures: MRI brain   mra head and neck.   Echocardiogram.    Antimicrobials: none   Subjective: No new complaints.   Objective: Vitals:   03/30/16 2216 03/31/16 0433 03/31/16 0948 03/31/16 1338  BP: (!) 149/94 122/74 (!) 140/93 (!) 142/91  Pulse: 84 89  82  Resp: 18 18  18   Temp: 98.3 F (36.8 C) 97.8 F (36.6 C)  98.1 F (36.7 C)  TempSrc: Oral Oral  Oral  SpO2: 97% 96%    Weight: 116.1 kg (256 lb) 115.5 kg (254 lb 9.6 oz)    Height: 5' 10.5" (1.791 m)      No intake or output data in the 24 hours ending 03/31/16 1903 Filed Weights   03/30/16 1724 03/30/16 2216 03/31/16 0433  Weight: 117.9 kg (260 lb) 116.1 kg (256 lb) 115.5 kg (254 lb 9.6 oz)    Examination:  General exam: Appears calm and comfortable  Respiratory system: Clear to auscultation. Respiratory effort normal. Cardiovascular system: S1 & S2 heard, RRR. No JVD, murmurs, rubs, gallops or clicks. No pedal edema. Gastrointestinal system: Abdomen is nondistended, soft and nontender. No organomegaly or masses felt. Normal bowel sounds heard. Central  nervous system: Alert and oriented. No focal neurological deficits. Extremities: Symmetric 5 x 5 power. Skin: No rashes, lesions or ulcers Psychiatry: Judgement and insight appear normal. Mood & affect appropriate.     Data Reviewed: I have personally reviewed following labs and imaging studies  CBC:  Recent Labs Lab 03/30/16 1730  WBC 6.6  HGB 15.2  HCT 43.9  MCV 95.9  PLT 0000000   Basic Metabolic Panel:  Recent Labs Lab 03/30/16 1730  NA 136  K 4.2  CL 103  CO2 24  GLUCOSE 90  BUN 14  CREATININE 1.27*  CALCIUM 9.0   GFR: Estimated Creatinine Clearance: 77.4 mL/min (by C-G formula based on SCr of 1.27 mg/dL). Liver Function Tests: No results for input(s): AST, ALT, ALKPHOS, BILITOT, PROT, ALBUMIN in the last 168 hours. No results for input(s): LIPASE, AMYLASE in the last 168 hours. No results for input(s): AMMONIA in the last 168 hours. Coagulation Profile: No results for input(s): INR, PROTIME in the last 168 hours. Cardiac Enzymes:  Recent Labs Lab 03/30/16 1730  TROPONINI <0.03   BNP (last 3 results) No results for input(s): PROBNP in the last 8760 hours. HbA1C: No results for input(s): HGBA1C in the last 72 hours. CBG:  Recent Labs Lab 03/30/16 1737 03/31/16 0600  GLUCAP 85 84   Lipid Profile: No results for input(s): CHOL, HDL, LDLCALC, TRIG, CHOLHDL, LDLDIRECT in the last 72  hours. Thyroid Function Tests: No results for input(s): TSH, T4TOTAL, FREET4, T3FREE, THYROIDAB in the last 72 hours. Anemia Panel: No results for input(s): VITAMINB12, FOLATE, FERRITIN, TIBC, IRON, RETICCTPCT in the last 72 hours. Sepsis Labs: No results for input(s): PROCALCITON, LATICACIDVEN in the last 168 hours.  No results found for this or any previous visit (from the past 240 hour(s)).       Radiology Studies: X-ray Chest Pa And Lateral  Result Date: 03/30/2016 CLINICAL DATA:  65 year old male with syncopal episode 3 hours ago. Initial encounter. EXAM:  CHEST  2 VIEW COMPARISON:  09/07/2012 and earlier. FINDINGS: Semi upright AP and lateral views of the chest. Stable cardiomegaly and mediastinal contours. Stable mild to moderate tortuosity of the descending thoracic aorta. Other mediastinal contours are within normal limits. Visualized tracheal air column is within normal limits. No pneumothorax, pulmonary edema, pleural effusion or confluent pulmonary opacity. No acute osseous abnormality identified. Stable cholecystectomy clips. IMPRESSION: Stable cardiomegaly. No acute cardiopulmonary abnormality. Electronically Signed   By: Genevie Ann M.D.   On: 03/30/2016 21:24   Ct Head Wo Contrast  Result Date: 03/30/2016 CLINICAL DATA:  Lightheaded and blurred vision 6 hours ago. Near syncope. EXAM: CT HEAD WITHOUT CONTRAST TECHNIQUE: Contiguous axial images were obtained from the base of the skull through the vertex without intravenous contrast. COMPARISON:  None. FINDINGS: There is mild generalized brain atrophy. The brainstem and cerebellum are normal. Cerebral hemispheres are normal except for some changes of chronic appearing small vessel disease of the deep white matter. No cortical or large vessel territory infarction. No mass lesion, hemorrhage, hydrocephalus or extra-axial collection. The calvarium is unremarkable. Sinuses, middle ears and mastoids are clear. There is atherosclerotic calcification of the major vessels at the base of the brain. IMPRESSION: No acute finding by CT. Chronic appearing small vessel changes of the cerebral hemispheric white matter. Atherosclerotic calcification of the major vessels at the base of the brain. Electronically Signed   By: Nelson Chimes M.D.   On: 03/30/2016 18:48   Mr [name] Head Wo Contrast  Result Date: 03/30/2016 CLINICAL DATA:  Initial evaluation for near syncope. EXAM: MRI HEAD WITHOUT CONTRAST MRA HEAD WITHOUT CONTRAST TECHNIQUE: Multiplanar, multiecho pulse sequences of the brain and surrounding structures were  obtained without intravenous contrast. Angiographic images of the head were obtained using MRA technique without contrast. COMPARISON:  Prior CT from earlier the same day. FINDINGS: MRI HEAD FINDINGS Cerebral volume normal for patient age. Mild patchy T2/FLAIR hyperintensity within the periventricular and deep white matter both cerebral hemispheres, nonspecific, but most likely related to chronic small vessel ischemic disease. No abnormal foci of restricted diffusion to suggest acute or subacute ischemia. Gray-white matter differentiation well maintained. Major intracranial vascular flow voids are preserved. No acute or chronic intracranial hemorrhage. No areas of chronic infarction. No mass lesion, midline shift, or mass effect. No hydrocephalus. No extra-axial fluid collection. Major dural sinuses are grossly patent. Cervicomedullary junction within normal limits. Visualized upper cervical spine unremarkable. Pituitary gland normal. No acute abnormality about the globes and orbits. Paranasal sinuses are clear. No mastoid effusion. Inner ear structures grossly normal. Bone marrow signal intensity within normal limits. No scalp soft tissue abnormality. MRA HEAD FINDINGS ANTERIOR CIRCULATION: Study mildly degraded by motion artifact. Distal cervical segments of the internal carotid arteries are patent with antegrade flow. Distal cervical left ICA is tortuous parent petrous, cavernous, and supraclinoid segments of the internal carotid arteries are widely patent bilaterally. Left A1 segment patent. Right A1 segment slightly  hypoplastic but patent as well. This likely accounts for the slightly diminutive right ICA as compared to the left. Anterior communicating artery normal. Anterior cerebral arteries well opacified bilaterally. M1 segments patent without stenosis or occlusion. MCA branches well perfused and symmetric. POSTERIOR CIRCULATION: Vertebral arteries patent to the vertebrobasilar junction. Right vertebral  artery slightly dominant. Posterior inferior cerebral arteries not well evaluated on this exam. Basilar artery well opacified to its distal aspect. Superior cerebral arteries patent bilaterally. Both of the posterior cerebral arteries arise from the basilar artery and are well opacified to their distal aspects. No aneurysm or vascular malformation. IMPRESSION: MRI HEAD IMPRESSION: 1. No acute intracranial infarct or other process identified. 2. Mild for age chronic small vessel ischemic disease. MRA HEAD IMPRESSION: Negative intracranial MRA. No large or proximal arterial branch occlusion. No high-grade or correctable stenosis identified. Electronically Signed   By: Jeannine Boga M.D.   On: 03/30/2016 23:09   Mri Brain Without Contrast  Result Date: 03/30/2016 CLINICAL DATA:  Initial evaluation for near syncope. EXAM: MRI HEAD WITHOUT CONTRAST MRA HEAD WITHOUT CONTRAST TECHNIQUE: Multiplanar, multiecho pulse sequences of the brain and surrounding structures were obtained without intravenous contrast. Angiographic images of the head were obtained using MRA technique without contrast. COMPARISON:  Prior CT from earlier the same day. FINDINGS: MRI HEAD FINDINGS Cerebral volume normal for patient age. Mild patchy T2/FLAIR hyperintensity within the periventricular and deep white matter both cerebral hemispheres, nonspecific, but most likely related to chronic small vessel ischemic disease. No abnormal foci of restricted diffusion to suggest acute or subacute ischemia. Gray-white matter differentiation well maintained. Major intracranial vascular flow voids are preserved. No acute or chronic intracranial hemorrhage. No areas of chronic infarction. No mass lesion, midline shift, or mass effect. No hydrocephalus. No extra-axial fluid collection. Major dural sinuses are grossly patent. Cervicomedullary junction within normal limits. Visualized upper cervical spine unremarkable. Pituitary gland normal. No acute  abnormality about the globes and orbits. Paranasal sinuses are clear. No mastoid effusion. Inner ear structures grossly normal. Bone marrow signal intensity within normal limits. No scalp soft tissue abnormality. MRA HEAD FINDINGS ANTERIOR CIRCULATION: Study mildly degraded by motion artifact. Distal cervical segments of the internal carotid arteries are patent with antegrade flow. Distal cervical left ICA is tortuous parent petrous, cavernous, and supraclinoid segments of the internal carotid arteries are widely patent bilaterally. Left A1 segment patent. Right A1 segment slightly hypoplastic but patent as well. This likely accounts for the slightly diminutive right ICA as compared to the left. Anterior communicating artery normal. Anterior cerebral arteries well opacified bilaterally. M1 segments patent without stenosis or occlusion. MCA branches well perfused and symmetric. POSTERIOR CIRCULATION: Vertebral arteries patent to the vertebrobasilar junction. Right vertebral artery slightly dominant. Posterior inferior cerebral arteries not well evaluated on this exam. Basilar artery well opacified to its distal aspect. Superior cerebral arteries patent bilaterally. Both of the posterior cerebral arteries arise from the basilar artery and are well opacified to their distal aspects. No aneurysm or vascular malformation. IMPRESSION: MRI HEAD IMPRESSION: 1. No acute intracranial infarct or other process identified. 2. Mild for age chronic small vessel ischemic disease. MRA HEAD IMPRESSION: Negative intracranial MRA. No large or proximal arterial branch occlusion. No high-grade or correctable stenosis identified. Electronically Signed   By: Jeannine Boga M.D.   On: 03/30/2016 23:09        Scheduled Meds: . irbesartan  150 mg Oral QPM   And  . amLODipine  2.5 mg Oral QPM  . aspirin  325 mg Oral Daily  . atorvastatin  20 mg Oral q1800  . sodium chloride flush  3 mL Intravenous Q12H   Continuous  Infusions:    LOS: 0 days    Time spent: 35 minutes.     Hosie Poisson, MD [facility] Pager [phone]  If 7PM-7AM, please contact night-coverage [email or web address] Password [facility] 03/31/2016, 7:03 PM

## 2016-03-31 NOTE — Progress Notes (Signed)
  Echocardiogram 2D Echocardiogram has been performed.  Johny Chess 03/31/2016, 5:38 PM

## 2016-04-01 DIAGNOSIS — I10 Essential (primary) hypertension: Secondary | ICD-10-CM | POA: Diagnosis not present

## 2016-04-01 DIAGNOSIS — R55 Syncope and collapse: Secondary | ICD-10-CM

## 2016-04-01 LAB — GLUCOSE, CAPILLARY: GLUCOSE-CAPILLARY: 87 mg/dL (ref 65–99)

## 2016-04-01 NOTE — Patient Instructions (Addendum)
[name]  04/01/2016   Your procedure is scheduled on: 04-03-16  Report to [facility] Main  Entrance take [facility]  elevators to 3rd floor to  [name] at  1:00 PM.  Call this number if you have problems the morning of surgery [phone]   Remember: ONLY 1 PERSON MAY GO WITH YOU TO SHORT STAY TO GET  READY MORNING OF [name].  Do not eat food or drink liquids :After Midnight.Exception, May have Clear Liquids 12 midnight to 0700 AM, then nothing.   CLEAR LIQUID DIET   Foods Allowed                                                                     Foods Excluded  Coffee and tea, regular and decaf                             liquids that you cannot  Plain Jell-O in any flavor                                             see through such as: Fruit ices (not with fruit pulp)                                     milk, soups, orange juice  Iced Popsicles                                    All solid food Carbonated beverages, regular and diet                                    Cranberry, grape and apple juices Sports drinks like Gatorade Lightly seasoned clear broth or consume(fat free) Sugar, honey syrup _____________________________________________________________________       Take these medicines the morning of surgery with A SIP OF WATER: NONE. DO NOT TAKE ANY DIABETIC MEDICATIONS DAY OF YOUR SURGERY                               You may not have any metal on your body including hair pins and              piercings  Do not wear jewelry, make-up, lotions, powders or perfumes, deodorant             Do not wear nail polish.  Do not shave  48 hours prior to surgery.              Men may shave face and neck.   Do not bring valuables to the hospital. [place].  Contacts, dentures or bridgework may not be worn into surgery.  Leave suitcase in the car. After surgery it may be brought to your  room.     Patients discharged the day of surgery will not be allowed to drive home.  Name and phone number of your driver: [name]- spouse [phone] cell  Special Instructions: N/A              Please read over the following fact sheets you were given: _____________________________________________________________________             [facility] - Preparing for Surgery Before surgery, you can play an important role.  Because skin is not sterile, your skin needs to be as free of germs as possible.  You can reduce the number of germs on your skin by washing with CHG (chlorahexidine gluconate) soap before surgery.  CHG is an antiseptic cleaner which kills germs and bonds with the skin to continue killing germs even after washing. Please DO NOT use if you have an allergy to CHG or antibacterial soaps.  If your skin becomes reddened/irritated stop using the CHG and inform your nurse when you arrive at Short Stay. Do not shave (including legs and underarms) for at least 48 hours prior to the first CHG shower.  You may shave your face/neck. Please follow these instructions carefully:  1.  Shower with CHG Soap the night before surgery and the  morning of Surgery.  2.  If you choose to wash your hair, wash your hair first as usual with your  normal  shampoo.  3.  After you shampoo, rinse your hair and body thoroughly to remove the  shampoo.                           4.  Use CHG as you would any other liquid soap.  You can apply chg directly  to the skin and wash                       Gently with a scrungie or clean washcloth.  5.  Apply the CHG Soap to your body ONLY FROM THE NECK DOWN.   Do not use on face/ open                           Wound or open sores. Avoid contact with eyes, ears mouth and genitals (private parts).                       Wash face,  Genitals (private parts) with your normal soap.             6.  Wash thoroughly, paying special attention to the area where your surgery   will be performed.  7.  Thoroughly rinse your body with warm water from the neck down.  8.  DO NOT shower/wash with your normal soap after using and rinsing off  the CHG Soap.                9.  Pat yourself dry with a clean towel.            10.  Wear clean pajamas.            11.  Place clean sheets on your bed the night of your first shower and do not  sleep with pets. Day of Surgery : Do not  apply any lotions/deodorants the morning of surgery.  Please wear clean clothes to the hospital/surgery center.  FAILURE TO FOLLOW THESE INSTRUCTIONS MAY RESULT IN THE CANCELLATION OF YOUR SURGERY PATIENT SIGNATURE_________________________________  NURSE SIGNATURE__________________________________  ________________________________________________________________________

## 2016-04-01 NOTE — Discharge Instructions (Signed)

## 2016-04-01 NOTE — Discharge Summary (Signed)
Physician Discharge Summary  [name] [id number] DOB: 28-Nov-1950 DOA: 03/30/2016  PCP: Kyle Casco, MD  Admit date: 03/30/2016 Discharge date: 04/01/2016  Recommendations for Outpatient Follow-up:  1. No changes in medications and discharge.  Discharge Diagnoses:  Principal Problem:   Near syncope Active Problems:   HTN (hypertension)    Discharge Condition: stable   Diet recommendation: as tolerated   History of present illness:   Per brief narrative 03/31/2016 "65 y.o.malewith medical history significant of HTN, bladder CA scheduled for TURBT procedure on the 17th. Patient presents to the ED after a near syncopal episode."  Hospital Course:    Assessment & Plan:   Near syncope Probably from taking the extra BP medication.  MRI/ MRA of head and neck unremarkable.  Echocardiogram with grade 1 diastolic dysfunction, EF [id number], hypokinesis.  Orthostatics negative   Hypertension, essential - Continue home med  - BP 142/93   DVT prophylaxis: (Lovenox) Code Status: (Full) Family Communication: wife at bedside.     Consultants:   None.   Procedures:  MRA head and neck.   Echocardiogram.   Antimicrobials  None     Signed:  Leisa Lenz, MD  [facility] 04/01/2016, 9:54 AM  Pager #: [phone]  Time spent in minutes: less than 30 minutes   Discharge Exam: Vitals:   03/31/16 1905 04/01/16 0653  BP: (!) 141/93 (!) 142/93  Pulse: 72 84  Resp: 18 18  Temp: 98.2 F (36.8 C) 98.1 F (36.7 C)   Vitals:   03/31/16 1338 03/31/16 1905 04/01/16 0500 04/01/16 0653  BP: (!) 142/91 (!) 141/93  (!) 142/93  Pulse: 82 72  84  Resp: 18 18  18   Temp: 98.1 F (36.7 C) 98.2 F (36.8 C)  98.1 F (36.7 C)  TempSrc: Oral Oral  Oral  SpO2:  97%    Weight:   114.4 kg (252 lb 3.2 oz)   Height:        General: Pt is alert, follows commands appropriately, not in acute distress Cardiovascular: Regular rate and rhythm,  S1/S2 +, no murmurs Respiratory: Clear to auscultation bilaterally, no wheezing, no crackles, no rhonchi Abdominal: Soft, non tender, non distended, bowel sounds +, no guarding Extremities: no edema, no cyanosis, pulses palpable bilaterally DP and PT Neuro: Grossly nonfocal  Discharge Instructions  Discharge Instructions    Call MD for:  persistant nausea and vomiting    Complete by:  As directed   Call MD for:  redness, tenderness, or signs of infection (pain, swelling, redness, odor or green/yellow discharge around incision site)    Complete by:  As directed   Call MD for:  severe uncontrolled pain    Complete by:  As directed   Diet - low sodium heart healthy    Complete by:  As directed   Increase activity slowly    Complete by:  As directed       Medication List    TAKE these medications   aspirin 325 MG tablet Take 325 mg by mouth daily.   simvastatin 40 MG tablet Commonly known as:  ZOCOR Take 40 mg by mouth every evening.   TWYNSTA 80-5 MG Tabs Generic drug:  Telmisartan-Amlodipine Take 0.5 tablets by mouth every evening. Patient taking 1/2 tablet daily.      Follow-up Information    Kyle Casco, MD. Schedule an appointment as soon as possible for a visit in 2 week(s).   Specialty:  Family Medicine Contact information: [address]  [place] [address] [phone]            The results of significant diagnostics from this hospitalization (including imaging, microbiology, ancillary and laboratory) are listed below for reference.    Significant Diagnostic Studies: X-ray Chest Pa And Lateral  Result Date: 03/30/2016 CLINICAL DATA:  65 year old male with syncopal episode 3 hours ago. Initial encounter. EXAM: CHEST  2 VIEW COMPARISON:  09/07/2012 and earlier. FINDINGS: Semi upright AP and lateral views of the chest. Stable cardiomegaly and mediastinal contours. Stable mild to moderate tortuosity of the descending thoracic aorta.  Other mediastinal contours are within normal limits. Visualized tracheal air column is within normal limits. No pneumothorax, pulmonary edema, pleural effusion or confluent pulmonary opacity. No acute osseous abnormality identified. Stable cholecystectomy clips. IMPRESSION: Stable cardiomegaly. No acute cardiopulmonary abnormality. Electronically Signed   By: Genevie Ann M.D.   On: 03/30/2016 21:24   Ct Head Wo Contrast  Result Date: 03/30/2016 CLINICAL DATA:  Lightheaded and blurred vision 6 hours ago. Near syncope. EXAM: CT HEAD WITHOUT CONTRAST TECHNIQUE: Contiguous axial images were obtained from the base of the skull through the vertex without intravenous contrast. COMPARISON:  None. FINDINGS: There is mild generalized brain atrophy. The brainstem and cerebellum are normal. Cerebral hemispheres are normal except for some changes of chronic appearing small vessel disease of the deep white matter. No cortical or large vessel territory infarction. No mass lesion, hemorrhage, hydrocephalus or extra-axial collection. The calvarium is unremarkable. Sinuses, middle ears and mastoids are clear. There is atherosclerotic calcification of the major vessels at the base of the brain. IMPRESSION: No acute finding by CT. Chronic appearing small vessel changes of the cerebral hemispheric white matter. Atherosclerotic calcification of the major vessels at the base of the brain. Electronically Signed   By: Nelson Chimes M.D.   On: 03/30/2016 18:48   Mr [name] Head Wo Contrast  Result Date: 03/30/2016 CLINICAL DATA:  Initial evaluation for near syncope. EXAM: MRI HEAD WITHOUT CONTRAST MRA HEAD WITHOUT CONTRAST TECHNIQUE: Multiplanar, multiecho pulse sequences of the brain and surrounding structures were obtained without intravenous contrast. Angiographic images of the head were obtained using MRA technique without contrast. COMPARISON:  Prior CT from earlier the same day. FINDINGS: MRI HEAD FINDINGS Cerebral volume normal for  patient age. Mild patchy T2/FLAIR hyperintensity within the periventricular and deep white matter both cerebral hemispheres, nonspecific, but most likely related to chronic small vessel ischemic disease. No abnormal foci of restricted diffusion to suggest acute or subacute ischemia. Gray-white matter differentiation well maintained. Major intracranial vascular flow voids are preserved. No acute or chronic intracranial hemorrhage. No areas of chronic infarction. No mass lesion, midline shift, or mass effect. No hydrocephalus. No extra-axial fluid collection. Major dural sinuses are grossly patent. Cervicomedullary junction within normal limits. Visualized upper cervical spine unremarkable. Pituitary gland normal. No acute abnormality about the globes and orbits. Paranasal sinuses are clear. No mastoid effusion. Inner ear structures grossly normal. Bone marrow signal intensity within normal limits. No scalp soft tissue abnormality. MRA HEAD FINDINGS ANTERIOR CIRCULATION: Study mildly degraded by motion artifact. Distal cervical segments of the internal carotid arteries are patent with antegrade flow. Distal cervical left ICA is tortuous parent petrous, cavernous, and supraclinoid segments of the internal carotid arteries are widely patent bilaterally. Left A1 segment patent. Right A1 segment slightly hypoplastic but patent as well. This likely accounts for the slightly diminutive right ICA as compared to the left. Anterior communicating artery normal. Anterior cerebral arteries well opacified bilaterally.  M1 segments patent without stenosis or occlusion. MCA branches well perfused and symmetric. POSTERIOR CIRCULATION: Vertebral arteries patent to the vertebrobasilar junction. Right vertebral artery slightly dominant. Posterior inferior cerebral arteries not well evaluated on this exam. Basilar artery well opacified to its distal aspect. Superior cerebral arteries patent bilaterally. Both of the posterior cerebral  arteries arise from the basilar artery and are well opacified to their distal aspects. No aneurysm or vascular malformation. IMPRESSION: MRI HEAD IMPRESSION: 1. No acute intracranial infarct or other process identified. 2. Mild for age chronic small vessel ischemic disease. MRA HEAD IMPRESSION: Negative intracranial MRA. No large or proximal arterial branch occlusion. No high-grade or correctable stenosis identified. Electronically Signed   By: Jeannine Boga M.D.   On: 03/30/2016 23:09   Mri Brain Without Contrast  Result Date: 03/30/2016 CLINICAL DATA:  Initial evaluation for near syncope. EXAM: MRI HEAD WITHOUT CONTRAST MRA HEAD WITHOUT CONTRAST TECHNIQUE: Multiplanar, multiecho pulse sequences of the brain and surrounding structures were obtained without intravenous contrast. Angiographic images of the head were obtained using MRA technique without contrast. COMPARISON:  Prior CT from earlier the same day. FINDINGS: MRI HEAD FINDINGS Cerebral volume normal for patient age. Mild patchy T2/FLAIR hyperintensity within the periventricular and deep white matter both cerebral hemispheres, nonspecific, but most likely related to chronic small vessel ischemic disease. No abnormal foci of restricted diffusion to suggest acute or subacute ischemia. Gray-white matter differentiation well maintained. Major intracranial vascular flow voids are preserved. No acute or chronic intracranial hemorrhage. No areas of chronic infarction. No mass lesion, midline shift, or mass effect. No hydrocephalus. No extra-axial fluid collection. Major dural sinuses are grossly patent. Cervicomedullary junction within normal limits. Visualized upper cervical spine unremarkable. Pituitary gland normal. No acute abnormality about the globes and orbits. Paranasal sinuses are clear. No mastoid effusion. Inner ear structures grossly normal. Bone marrow signal intensity within normal limits. No scalp soft tissue abnormality. MRA HEAD  FINDINGS ANTERIOR CIRCULATION: Study mildly degraded by motion artifact. Distal cervical segments of the internal carotid arteries are patent with antegrade flow. Distal cervical left ICA is tortuous parent petrous, cavernous, and supraclinoid segments of the internal carotid arteries are widely patent bilaterally. Left A1 segment patent. Right A1 segment slightly hypoplastic but patent as well. This likely accounts for the slightly diminutive right ICA as compared to the left. Anterior communicating artery normal. Anterior cerebral arteries well opacified bilaterally. M1 segments patent without stenosis or occlusion. MCA branches well perfused and symmetric. POSTERIOR CIRCULATION: Vertebral arteries patent to the vertebrobasilar junction. Right vertebral artery slightly dominant. Posterior inferior cerebral arteries not well evaluated on this exam. Basilar artery well opacified to its distal aspect. Superior cerebral arteries patent bilaterally. Both of the posterior cerebral arteries arise from the basilar artery and are well opacified to their distal aspects. No aneurysm or vascular malformation. IMPRESSION: MRI HEAD IMPRESSION: 1. No acute intracranial infarct or other process identified. 2. Mild for age chronic small vessel ischemic disease. MRA HEAD IMPRESSION: Negative intracranial MRA. No large or proximal arterial branch occlusion. No high-grade or correctable stenosis identified. Electronically Signed   By: Jeannine Boga M.D.   On: 03/30/2016 23:09    Microbiology: No results found for this or any previous visit (from the past 240 hour(s)).   Labs: Basic Metabolic Panel:  Recent Labs Lab 03/30/16 1730  NA 136  K 4.2  CL 103  CO2 24  GLUCOSE 90  BUN 14  CREATININE 1.27*  CALCIUM 9.0   Liver Function Tests:  No results for input(s): AST, ALT, ALKPHOS, BILITOT, PROT, ALBUMIN in the last 168 hours. No results for input(s): LIPASE, AMYLASE in the last 168 hours. No results for  input(s): AMMONIA in the last 168 hours. CBC:  Recent Labs Lab 03/30/16 1730  WBC 6.6  HGB 15.2  HCT 43.9  MCV 95.9  PLT 188   Cardiac Enzymes:  Recent Labs Lab 03/30/16 1730  TROPONINI <0.03   BNP: BNP (last 3 results) No results for input(s): BNP in the last 8760 hours.  ProBNP (last 3 results) No results for input(s): PROBNP in the last 8760 hours.  CBG:  Recent Labs Lab 03/30/16 1737 03/31/16 0600 03/31/16 2136 04/01/16 0612  GLUCAP 85 84 111* 87

## 2016-04-01 NOTE — Progress Notes (Addendum)
Patient in stable condition, this RN went over discharge teachings with patient and wife at bedside, they verbalised understanding, belongings at bedside, iv removed, tele dc ccmd notified,patient taken off the unit on a wheelchair by a NT

## 2016-04-02 ENCOUNTER — Encounter (HOSPITAL_COMMUNITY)
Admission: RE | Admit: 2016-04-02 | Discharge: 2016-04-02 | Disposition: A | Discharge status: Home / Self Care | Payer: BLUE CROSS/BLUE SHIELD | Source: Ambulatory Visit | Attending: Urology | Admitting: Urology

## 2016-04-02 ENCOUNTER — Encounter (HOSPITAL_COMMUNITY): Payer: Self-pay

## 2016-04-02 DIAGNOSIS — Z79899 Other long term (current) drug therapy: Secondary | ICD-10-CM | POA: Diagnosis not present

## 2016-04-02 DIAGNOSIS — C670 Malignant neoplasm of trigone of bladder: Secondary | ICD-10-CM | POA: Diagnosis not present

## 2016-04-02 DIAGNOSIS — C61 Malignant neoplasm of prostate: Secondary | ICD-10-CM | POA: Diagnosis not present

## 2016-04-02 DIAGNOSIS — Z7951 Long term (current) use of inhaled steroids: Secondary | ICD-10-CM | POA: Diagnosis not present

## 2016-04-02 DIAGNOSIS — E7800 Pure hypercholesterolemia, unspecified: Secondary | ICD-10-CM | POA: Diagnosis not present

## 2016-04-02 DIAGNOSIS — Z08 Encounter for follow-up examination after completed treatment for malignant neoplasm: Secondary | ICD-10-CM | POA: Diagnosis present

## 2016-04-02 DIAGNOSIS — Z7982 Long term (current) use of aspirin: Secondary | ICD-10-CM | POA: Diagnosis not present

## 2016-04-02 DIAGNOSIS — I10 Essential (primary) hypertension: Secondary | ICD-10-CM | POA: Diagnosis not present

## 2016-04-02 DIAGNOSIS — Z86711 Personal history of pulmonary embolism: Secondary | ICD-10-CM | POA: Diagnosis not present

## 2016-04-02 HISTORY — DX: Umbilical hernia without obstruction or gangrene: K42.9

## 2016-04-02 HISTORY — DX: Syncope and collapse: R55

## 2016-04-02 NOTE — Pre-Procedure Instructions (Signed)
EKG /CXR 03-30-16, labs -CBc, BMP 03-30-16 Epic. Echo 03-31-16 Epic. Stress 09-28-15 Epic.

## 2016-04-02 NOTE — H&P (Signed)
CC/HPI: I have had bladder cancer and have prostate cancer.   [name] returns today in f/u for cystoscopy and follow up of his very low risk prostate cancer. He had a repeat biopsy in 5/17 and it showed a single core on the right apex with 30% Gleason 6 diseae.   His last PSA was 4.83. It was up to 9.94 post BCG and a biopsy showed 2 apical cores of <5% Gleason 6 disease in 3/16. He had an MRI of the prostate in June 2016 that showed no evidence of macroscopic prostate carcinoma.   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 His cystoscopy in 5/17 was negative. He has chronic hematuria 3-10 RBC's today. He has nocturia x 2 but no other voiding complaints. He has had no hematuria or dysuria. He has no associated signs or symptoms.       ALLERGIES: Vibramycin CAPS    MEDICATIONS: Aspirin 325 MG Oral Tablet Oral  Flonase 50 MCG/ACT Nasal Suspension Nasal  Simvastatin 80 MG Oral Tablet Oral  Twynsta 80-5 MG Oral Tablet 0 Oral     GU PSH: Bladder Instill AntiCA Agent - 06/21/2014 Cystoscopy Irrigate Clot - 06/21/2014 Cystoscopy TURBT 2-5 cm - 06/21/2014 Hernia Repair - 06/14/2014      PSH Notes: Bladder Injection Of Cancer Treatment, Cystoscopy With Fulguration Medium Lesion (2-5cm), Cystourethroscopy With Irrigation And Evacuation Of Clots, Hernia Repair, Destruction Of Malignant Lesion, Cholecystectomy   NON-GU PSH: Cholecystectomy - 2010    GU PMH: Bladder Cancer Lateral, Malignant neoplasm of lateral wall of bladder - 12/19/2015 Elevated PSA, Elevated prostate specific antigen (PSA) - 12/19/2015 Prostate Cancer, Prostate cancer - 12/19/2015 Bladder Cancer, overlapping sites, Malignant neoplasm of overlapping sites of bladder - 12/21/2014 BPH w/o LUTS, Benign prostate hyperplasia - 12/07/2014 Personal Hx Oth Urinary System diseases, History of glomerulonephritis -  06/14/2014    NON-GU PMH: Encounter for general adult medical examination without abnormal findings, Encounter for preventive health examination - 01/31/2015 Personal history of pulmonary embolism, History of pulmonary embolism - 06/14/2014 Personal history of other diseases of the circulatory system, History of hypertension - 2014 Personal history of other endocrine, nutritional and metabolic disease, History of hypercholesterolemia - 2014 Personal history of other specified conditions, History of heartburn - 2014    FAMILY HISTORY: Congestive Heart Failure - Runs In Family Hematuria - Mother Hypertension - Runs In Family, Father, Mother Lung Cancer - Runs In Family malignant neoplasm of breast - Runs In Family systemic lupus erythematosus - Runs In Family   SOCIAL HISTORY: Marital Status: Married Current Smoking Status: Patient has never smoked.  Drinks 1 drink per week.  Drinks 4+ caffeinated drinks per day.     Notes: Occupation, Alcohol Use, Never a smoker, Mother deceased, Marital History - Currently Married, Tobacco Use   REVIEW OF SYSTEMS:    GU Review Male:   Patient reports get up at night to urinate. Patient denies frequent urination, hard to postpone urination, burning/ pain with urination, leakage of urine, stream starts and stops, trouble starting your stream, have to strain to urinate , erection problems, and penile pain.  Gastrointestinal (Upper):   Patient denies nausea, vomiting, and indigestion/ heartburn.  Gastrointestinal (Lower):   Patient denies diarrhea and constipation.  Constitutional:   Patient denies fever, night sweats, weight loss, and fatigue.  Skin:   Patient denies skin rash/ lesion  and itching.  Eyes:   Patient denies blurred vision and double vision.  Ears/ Nose/ Throat:   Patient denies sore throat and sinus problems.  Hematologic/Lymphatic:   Patient reports easy bruising. Patient denies swollen glands.  Cardiovascular:   Patient denies leg  swelling and chest pains.  Respiratory:   Patient denies cough and shortness of breath.  Endocrine:   Patient denies excessive thirst.  Musculoskeletal:   Patient denies back pain and joint pain.  Neurological:   Patient denies headaches and dizziness.  Psychologic:   Patient denies depression and anxiety.   VITAL SIGNS:      03/26/2016 09:11 AM  Weight 250 lb / 113.4 kg  BP 144/91 mmHg  Heart Rate 89 /min   MULTI-SYSTEM PHYSICAL EXAMINATION:    Constitutional: Well-nourished. No physical deformities. Normally developed. Good grooming.   Respiratory: No labored breathing, no use of accessory muscles. CTA  Cardiovascular: Normal temperature, normal extremity pulses, no swelling, no varicosities. RRR without murmur.     PAST DATA REVIEWED:  Source Of History:  Patient  Urine Test Review:   Urinalysis   08/18/15 01/22/15 09/29/14 06/15/14 12/25/08  PSA  Total PSA 4.85  5.23  9.94  5.12  3.36   Free PSA   1.42  1.25    % Free PSA   14  24      PROCEDURES:         Flexible Cystoscopy - 52000  Risks, benefits, and some of the potential complications of the procedure were discussed. 70ml of 2% lidocaine jelly was instilled intraurethrally.  Cipro 500mg  given for antibiotic prophylaxis.     Meatus:  Normal size. Normal location. Normal condition.  Urethra:  No strictures.  External Sphincter:  Normal.  Verumontanum:  Normal.  Prostate:  Borderline obstructing. Moderate hyperplasia. 3-4cm bilobar.  Bladder Neck:  Non-obstructing.  Ureteral Orifices:  Normal location. Normal size. Normal shape. Effluxed clear urine on the left. I didn't see the right UO clearly.   Bladder:  A trigone tumor on the right about 1cm in size. mild trabeculation. Normal mucosa but he has neovascularity from the cytoxan. No stones.      The procedure was well tolerated and there were no complications.         Urinalysis w/Scope - 81001 Dipstick Dipstick Cont'd Micro  Specimen: Voided Bilirubin: Neg  WBC/hpf: 0-5/hpf  Color: Yellow Ketones: Neg RBC/hpf: 0-2/hpf  Appearance: Clear Blood: 2+ Bacteria: Rare  Specific Gravity: 1.020 Protein: 3+ Cystals: NS (Not Seen)  pH: 5.5 Urobilinogen: 0.2 Casts: Hyaline  Glucose: Neg Nitrites: Neg Trichomonas: Not Present      Mucous: Not Present      Epithelial Cells: 0-5/hpf      Yeast: NS (Not Seen)      Sperm: Not Present    ASSESSMENT:      ICD-10 Details  1 GU:   Personal history of malignant neoplasm of bladder - Z85.51   2   Prostate Cancer - C61 Stable - His path shows mimimal change in the right apical tumor.   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   PLAN:           Orders Labs Urine Cytology          Schedule Labs: 3 Months - Urinalysis    3 Months - PSA  Return  Visit: 3 Months - Office Visit, Cystoscopy  Return Visit: ASAP - Schedule Surgery             Note: Please try to get this done next week if possible.   Procedure: Approximately 3 Months at [facility] [phone] - Flexible Cystoscopy (Cystoscopy) - 52000          Document Letter(s):  Created for Patient: Clinical Summary         Notes:   He has a recurrence at the left trigone.   I will get him set up for cystoscopy with bilateral retrograde pyelograms, TURBT and instillation of epidrubicin. I reviewed the risks of bleeding, infection, injury to the bladder and ureter, possible need for a stent, chemical cystitis, thrombotic events and anesthetic complications. He will need BCG with maintenance following surgery.   CC: Dr. Kelton Pillar and Dr. Jeneen Rinks Deterding.

## 2016-04-03 ENCOUNTER — Ambulatory Visit (HOSPITAL_COMMUNITY): Payer: BLUE CROSS/BLUE SHIELD | Admitting: Anesthesiology

## 2016-04-03 ENCOUNTER — Ambulatory Visit (HOSPITAL_COMMUNITY)
Admission: RE | Admit: 2016-04-03 | Discharge: 2016-04-03 | Disposition: A | Discharge status: Home / Self Care | Payer: BLUE CROSS/BLUE SHIELD | Source: Ambulatory Visit | Attending: Urology | Admitting: Urology

## 2016-04-03 ENCOUNTER — Encounter (HOSPITAL_COMMUNITY): Payer: Self-pay | Admitting: *Deleted

## 2016-04-03 ENCOUNTER — Encounter (HOSPITAL_COMMUNITY)
Admission: RE | Disposition: A | Discharge status: Home / Self Care | Payer: Self-pay | Source: Ambulatory Visit | Attending: Urology

## 2016-04-03 DIAGNOSIS — Z86711 Personal history of pulmonary embolism: Secondary | ICD-10-CM | POA: Insufficient documentation

## 2016-04-03 DIAGNOSIS — I10 Essential (primary) hypertension: Secondary | ICD-10-CM | POA: Insufficient documentation

## 2016-04-03 DIAGNOSIS — Z7982 Long term (current) use of aspirin: Secondary | ICD-10-CM | POA: Insufficient documentation

## 2016-04-03 DIAGNOSIS — Z7951 Long term (current) use of inhaled steroids: Secondary | ICD-10-CM | POA: Insufficient documentation

## 2016-04-03 DIAGNOSIS — C61 Malignant neoplasm of prostate: Secondary | ICD-10-CM | POA: Insufficient documentation

## 2016-04-03 DIAGNOSIS — E7800 Pure hypercholesterolemia, unspecified: Secondary | ICD-10-CM | POA: Diagnosis not present

## 2016-04-03 DIAGNOSIS — D494 Neoplasm of unspecified behavior of bladder: Secondary | ICD-10-CM | POA: Diagnosis not present

## 2016-04-03 DIAGNOSIS — C670 Malignant neoplasm of trigone of bladder: Secondary | ICD-10-CM | POA: Diagnosis not present

## 2016-04-03 DIAGNOSIS — Z79899 Other long term (current) drug therapy: Secondary | ICD-10-CM | POA: Insufficient documentation

## 2016-04-03 HISTORY — PX: TRANSURETHRAL RESECTION OF BLADDER TUMOR: SHX2575

## 2016-04-03 HISTORY — PX: CYSTOSCOPY/RETROGRADE/URETEROSCOPY: SHX5316

## 2016-04-03 SURGERY — CYSTOSCOPY/RETROGRADE/URETEROSCOPY
Anesthesia: General | Site: Bladder

## 2016-04-03 MED ORDER — FENTANYL CITRATE (PF) 100 MCG/2ML IJ SOLN: 25.0000 ug | INTRAMUSCULAR | Status: DC | PRN

## 2016-04-03 MED ORDER — ESMOLOL HCL 100 MG/10ML IV SOLN
INTRAVENOUS | Status: AC
  Filled 2016-04-03: qty 10

## 2016-04-03 MED ORDER — FENTANYL CITRATE (PF) 100 MCG/2ML IJ SOLN
INTRAMUSCULAR | Status: DC | PRN
  Administered 2016-04-03 (×2): 50 ug via INTRAVENOUS

## 2016-04-03 MED ORDER — SODIUM CHLORIDE 0.9 % IV SOLN: 250.0000 mL | INTRAVENOUS | Status: DC | PRN

## 2016-04-03 MED ORDER — LIDOCAINE HCL (CARDIAC) 20 MG/ML IV SOLN
INTRAVENOUS | Status: AC
  Filled 2016-04-03: qty 5

## 2016-04-03 MED ORDER — MIDAZOLAM HCL 2 MG/2ML IJ SOLN
INTRAMUSCULAR | Status: AC
  Filled 2016-04-03: qty 2

## 2016-04-03 MED ORDER — LIDOCAINE HCL 2 % EX GEL
CUTANEOUS | Status: AC
  Filled 2016-04-03: qty 5

## 2016-04-03 MED ORDER — ONDANSETRON HCL 4 MG/2ML IJ SOLN
INTRAMUSCULAR | Status: DC | PRN
  Administered 2016-04-03: 4 mg via INTRAVENOUS

## 2016-04-03 MED ORDER — SUCCINYLCHOLINE CHLORIDE 20 MG/ML IJ SOLN
INTRAMUSCULAR | Status: DC | PRN
  Administered 2016-04-03: 40 mg via INTRAVENOUS
  Administered 2016-04-03: 100 mg via INTRAVENOUS

## 2016-04-03 MED ORDER — CEFAZOLIN SODIUM-DEXTROSE 2-4 GM/100ML-% IV SOLN
2.0000 g | INTRAVENOUS | Status: AC
  Administered 2016-04-03: 2 g via INTRAVENOUS
  Filled 2016-04-03: qty 100

## 2016-04-03 MED ORDER — EPHEDRINE SULFATE 50 MG/ML IJ SOLN
INTRAMUSCULAR | Status: DC | PRN
  Administered 2016-04-03: 5 mg via INTRAVENOUS
  Administered 2016-04-03: 10 mg via INTRAVENOUS
  Administered 2016-04-03: 5 mg via INTRAVENOUS

## 2016-04-03 MED ORDER — LIDOCAINE HCL (CARDIAC) 20 MG/ML IV SOLN
INTRAVENOUS | Status: DC | PRN
  Administered 2016-04-03: 100 mg via INTRAVENOUS

## 2016-04-03 MED ORDER — ACETAMINOPHEN 325 MG PO TABS: 650.0000 mg | ORAL_TABLET | ORAL | Status: DC | PRN

## 2016-04-03 MED ORDER — PROPOFOL 10 MG/ML IV BOLUS
INTRAVENOUS | Status: DC | PRN
  Administered 2016-04-03: 200 mg via INTRAVENOUS

## 2016-04-03 MED ORDER — ESMOLOL HCL 100 MG/10ML IV SOLN
INTRAVENOUS | Status: DC | PRN
  Administered 2016-04-03: 20 mg via INTRAVENOUS

## 2016-04-03 MED ORDER — FENTANYL CITRATE (PF) 100 MCG/2ML IJ SOLN
INTRAMUSCULAR | Status: AC
  Filled 2016-04-03: qty 2

## 2016-04-03 MED ORDER — PHENAZOPYRIDINE HCL 200 MG PO TABS: 200.0000 mg | ORAL_TABLET | Freq: Three times a day (TID) | ORAL | 0 refills | Status: DC | PRN

## 2016-04-03 MED ORDER — SODIUM CHLORIDE 0.9 % IJ SOLN
INTRAMUSCULAR | Status: AC
  Filled 2016-04-03: qty 10

## 2016-04-03 MED ORDER — PROPOFOL 10 MG/ML IV BOLUS
INTRAVENOUS | Status: AC
  Filled 2016-04-03: qty 20

## 2016-04-03 MED ORDER — IOPAMIDOL (ISOVUE-300) INJECTION 61%
INTRAVENOUS | Status: DC | PRN
  Administered 2016-04-03: 25 mL

## 2016-04-03 MED ORDER — EPHEDRINE SULFATE 50 MG/ML IJ SOLN
INTRAMUSCULAR | Status: AC
  Filled 2016-04-03: qty 1

## 2016-04-03 MED ORDER — SODIUM CHLORIDE 0.9% FLUSH: 3.0000 mL | INTRAVENOUS | Status: DC | PRN

## 2016-04-03 MED ORDER — MIDAZOLAM HCL 5 MG/5ML IJ SOLN
INTRAMUSCULAR | Status: DC | PRN
  Administered 2016-04-03: 2 mg via INTRAVENOUS

## 2016-04-03 MED ORDER — SODIUM CHLORIDE 0.9 % IV SOLN
50.0000 mg | Freq: Once | INTRAVENOUS | Status: AC
  Administered 2016-04-03: 50 mg via INTRAVESICAL
  Filled 2016-04-03: qty 25

## 2016-04-03 MED ORDER — ACETAMINOPHEN 650 MG RE SUPP
650.0000 mg | RECTAL | Status: DC | PRN
  Filled 2016-04-03: qty 1

## 2016-04-03 MED ORDER — MEPERIDINE HCL 50 MG/ML IJ SOLN
6.2500 mg | INTRAMUSCULAR | Status: DC | PRN
Start: 2016-04-03 — End: 2016-04-03

## 2016-04-03 MED ORDER — ONDANSETRON HCL 4 MG/2ML IJ SOLN
INTRAMUSCULAR | Status: AC
  Filled 2016-04-03: qty 2

## 2016-04-03 MED ORDER — SODIUM CHLORIDE 0.9% FLUSH: 3.0000 mL | Freq: Two times a day (BID) | INTRAVENOUS | Status: DC

## 2016-04-03 MED ORDER — HYDROCODONE-ACETAMINOPHEN 5-325 MG PO TABS: 1.0000 | ORAL_TABLET | Freq: Four times a day (QID) | ORAL | 0 refills | Status: DC | PRN

## 2016-04-03 MED ORDER — CEFAZOLIN SODIUM-DEXTROSE 2-4 GM/100ML-% IV SOLN
INTRAVENOUS | Status: AC
  Filled 2016-04-03: qty 100

## 2016-04-03 MED ORDER — LACTATED RINGERS IV SOLN
INTRAVENOUS | Status: DC
  Administered 2016-04-03: 1000 mL via INTRAVENOUS

## 2016-04-03 MED ORDER — OXYCODONE HCL 5 MG PO TABS: 5.0000 mg | ORAL_TABLET | ORAL | Status: DC | PRN

## 2016-04-03 SURGICAL SUPPLY — 31 items
BAG URINE DRAINAGE (UROLOGICAL SUPPLIES) IMPLANT
BAG URO CATCHER STRL LF (MISCELLANEOUS) ×4 IMPLANT
BASKET LASER NITINOL 1.9FR (BASKET) IMPLANT
BASKET STONE NCOMPASS (UROLOGICAL SUPPLIES) IMPLANT
BSKT STON RTRVL 120 1.9FR (BASKET)
CATH FOLEY 3WAY 30CC 22FR (CATHETERS) IMPLANT
CATH URET 5FR 28IN OPEN ENDED (CATHETERS) ×4 IMPLANT
CATH URET DUAL LUMEN 6-10FR 50 (CATHETERS) ×4 IMPLANT
CLOTH BEACON ORANGE TIMEOUT ST (SAFETY) ×4 IMPLANT
FIBER LASER FLEXIVA 1000 (UROLOGICAL SUPPLIES) IMPLANT
FIBER LASER FLEXIVA 365 (UROLOGICAL SUPPLIES) IMPLANT
FIBER LASER FLEXIVA 550 (UROLOGICAL SUPPLIES) IMPLANT
GLOVE SURG SS PI 6.0 STRL IVOR (GLOVE) ×4 IMPLANT
GLOVE SURG SS PI 8.0 STRL IVOR (GLOVE) ×4 IMPLANT
GOWN STRL REUS W/TWL XL LVL3 (GOWN DISPOSABLE) ×8 IMPLANT
GUIDEWIRE STR DUAL SENSOR (WIRE) ×4 IMPLANT
HOLDER FOLEY CATH W/STRAP (MISCELLANEOUS) IMPLANT
IV NS 1000ML (IV SOLUTION) ×4
IV NS 1000ML BAXH (IV SOLUTION) ×2 IMPLANT
IV NS IRRIG 3000ML ARTHROMATIC (IV SOLUTION) ×4 IMPLANT
LOOP CUT BIPOLAR 24F LRG (ELECTROSURGICAL) ×4 IMPLANT
MANIFOLD NEPTUNE II (INSTRUMENTS) ×4 IMPLANT
PACK CYSTO (CUSTOM PROCEDURE TRAY) ×4 IMPLANT
SET ASPIRATION TUBING (TUBING) IMPLANT
SHEATH ACCESS URETERAL 38CM (SHEATH) ×4 IMPLANT
SHEATH URET ACCESS 10/12FR (MISCELLANEOUS) IMPLANT
SUT ETHILON 3 0 PS 1 (SUTURE) IMPLANT
SYR 30ML LL (SYRINGE) IMPLANT
SYRINGE IRR TOOMEY STRL 70CC (SYRINGE) IMPLANT
TUBING CONNECTING 10 (TUBING) ×3 IMPLANT
TUBING CONNECTING 10' (TUBING) ×1

## 2016-04-03 NOTE — Discharge Instructions (Signed)
Cystoscopy, Care After Refer to this sheet in the next few weeks. These instructions provide you with information on caring for yourself after your procedure. Your caregiver may also give you more specific instructions. Your treatment has been planned according to current medical practices, but problems sometimes occur. Call your caregiver if you have any problems or questions after your procedure. HOME CARE INSTRUCTIONS  Things you can do to ease any discomfort after your procedure include: Drinking enough water and fluids to keep your urine clear or pale yellow. Taking a warm bath to relieve any burning feelings. SEEK IMMEDIATE MEDICAL CARE IF:  You have an increase in blood in your urine. You notice blood clots in your urine. You have difficulty passing urine. You have the chills. You have abdominal pain. You have a fever or persistent symptoms for more than 2-3 days. You have a fever and your symptoms suddenly get worse. MAKE SURE YOU:  Understand these instructions. Will watch your condition. Will get help right away if you are not doing well or get worse.   This information is not intended to replace advice given to you by your health care provider. Make sure you discuss any questions you have with your health care provider.   Document Released: 02/21/2005 Document Revised: 08/25/2014 Document Reviewed: 01/26/2012 Elsevier Interactive Patient Education 2016 [employer] Transurethral Resection, Bladder Tumor A cancerous growth (tumor) can develop on the inside wall of the bladder. The bladder is the organ that holds urine. One way to remove the tumor is a procedure called a transurethral resection. The tumor is removed (resected) through the tube that carries urine from the bladder out of the body (urethra). No cuts (incisions) are made in the skin. Instead, the procedure is done through a thin telescope, called a resectoscope. Attached to it is a light and usually a tiny camera. The  resectoscope is put into the urethra. In men, the urethra opens at the end of the penis. In women, it opens just above the vagina.  A transurethral resection is usually used to remove tumors that have not gotten too big or too deep. These are called Stage 0, Stage 1 or Stage 2 bladder cancers. LET YOUR CAREGIVER KNOW ABOUT:  On the day of the procedure, your caregivers will need to know the last time you had anything to eat or drink. This includes water, gum, and candy. In advance, make sure they know about:   Any allergies.  All medications you are taking, including:  Herbs, eyedrops, over-the-counter medications and creams.  Blood thinners (anticoagulants), aspirin or other drugs that could affect blood clotting.  Use of steroids (by mouth or as creams).  Previous problems with anesthetics, including local anesthetics.  Possibility of pregnancy, if this applies.  Any history of blood clots.  Any history of bleeding or other blood problems.  Previous surgery.  Smoking history.  Any recent symptoms of colds or infections.  Other health problems. RISKS AND COMPLICATIONS This is usually a safe procedure. Every procedure has risks, though. For a transurethral resection, they include:  Infection. Antibiotic medication would need to be taken.  Bleeding.  Light bleeding may last for several days after the procedure.  If bleeding continues or is heavy, the bladder may need rinsing. Or, a new catheter might be put in for awhile.  Sometimes bed rest is needed.  Urination problems.  Pain and burning can occur when urinating. This usually goes away in a few days.  Scarring from the procedure can  block the flow of urine.  Bladder damage.  It can be punctured or torn during removal of the tumor. If this happens, a catheter might be needed for longer. Antibiotics would be taken while the bladder heals.  Urine can leak through the hole or tear into the abdomen. If this happens,  surgery may be needed to repair the bladder. BEFORE THE PROCEDURE   A medical evaluation will be done. This may include:  A physical examination.  Urine test. This is to make sure you do not have a urinary tract infection.  Blood tests.  A test that checks the heart's rhythm (electrocardiogram).  Talking with an anesthesiologist. This is the person who will be in charge of the medication (anesthesia) to keep you from feeling pain during the transurethral resection. You might be asleep during the procedure (general anesthesia) or numb from the waist down, but awake during the procedure (spinal anesthesia). Ask your surgeon what to expect.  The person who is having a transurethral resection needs to give what is called informed consent. This requires signing a legal paper that gives permission for the procedure. To give informed consent:  You must understand how the procedure is done and why.  You must be told all the risks and benefits of the procedure.  You must sign the consent. Sometimes a legal guardian can do this.  Signing should be witnessed by a healthcare professional.  The day before the surgery, eat only a light dinner. Then, do not eat or drink anything for at least 8 hours before the surgery. Ask your caregiver if it is OK to take any needed medicines with a sip of water.  Arrive at least an hour before the surgery or whenever your surgeon recommends. This will give you time to check in and fill out any needed paperwork. PROCEDURE  The preparation:  You will change into a hospital gown.  A needle will be inserted in your arm. This is an intravenous access tube (IV). Medication will be able to flow directly into your body through this needle.  Small monitors will be put on your body. They are used to check your heart, blood pressure, and oxygen level.  You might be given medication that will help you relax (sedative).  You will be given a general anesthetic or  spinal anesthesia.  The procedure:  Once you are asleep or numb from the waist down, your legs will be placed in stirrups.  The resectoscope will be passed through the urethra into the bladder.  Fluid will be passed through the resectoscope. This will fill the bladder with water.  The surgeon will examine the bladder through the scope. If the scope has a camera, it can take pictures from inside the bladder. They can be projected onto a TV screen.  The surgeon will use various tools to remove the tumor in small pieces. Sometimes a laser (a beam of light energy) is used. Other tools may use electric current.  A tube (catheter) will often be placed so that urine can drain into a bag outside the body. This process helps stop bleeding. This tube keeps blood clots from blocking the urethra.  The procedure usually takes 30 to 45 minutes. AFTER THE PROCEDURE   You will stay in a recovery area until the anesthesia has worn off. Your blood pressure and pulse will be checked every so often. Then you will be taken to a hospital room.  You may continue to get fluids through the IV  for awhile.  Some pain is normal. The catheter might be uncomfortable. Pain is usually not severe. If it is, ask for pain medicine.  Your urine may look bloody after a transurethral resection. This is normal.  If bleeding is heavy, a hospital caregiver may rinse out the bladder (irrigation) through the catheter.  Once the urine is clear, the catheter will be taken out.  You will need to stay in the hospital until you can urinate on your own.  Most people stay in the hospital for up to 4 days. PROGNOSIS   Transurethral resection is considered the best way to treat bladder tumors that are not too far along. For most people, the treatment is successful. Sometimes, though, more treatment is needed.  Bladder cancers can come back even after a successful procedure. Because of this, be sure to have a checkup with your  caregiver every 3 to 6 months. If everything is OK for 3 years, you can reduce the checkups to once a year.   This information is not intended to replace advice given to you by your health care provider. Make sure you discuss any questions you have with your health care provider.   Document Released: 05/31/2009 Document Revised: 10/27/2011 Document Reviewed: 08/06/2009 Elsevier Interactive Patient Education [employer].

## 2016-04-03 NOTE — Anesthesia Procedure Notes (Signed)
Procedure Name: Intubation Date/Time: 04/03/2016 3:32 PM Performed by: Maxwell Caul Pre-anesthesia Checklist: Patient identified, Emergency Drugs available, Suction available and Patient being monitored Patient Re-evaluated:Patient Re-evaluated prior to inductionOxygen Delivery Method: Circle system utilized Preoxygenation: Pre-oxygenation with 100% oxygen Intubation Type: IV induction Ventilation: Mask ventilation without difficulty Laryngoscope Size: Mac and 4 Grade View: Grade II Tube type: Oral Tube size: 7.5 mm Number of attempts: 1 Airway Equipment and Method: Stylet Placement Confirmation: ETT inserted through vocal cords under direct vision,  positive ETCO2 and breath sounds checked- equal and bilateral Secured at: 21 cm Tube secured with: Tape Dental Injury: Teeth and Oropharynx as per pre-operative assessment

## 2016-04-03 NOTE — Interval H&P Note (Signed)
History and Physical Interval Note:  04/03/2016 2:44 PM  [name]  has presented today for surgery, with the diagnosis of BLADDER TUMOR  The various methods of treatment have been discussed with the patient and family. After consideration of risks, benefits and other options for treatment, the patient has consented to  Procedure(s): CYSTOSCOPY/RETROGRADE/URETEROSCOPY (N/A) TRANSURETHRAL RESECTION OF BLADDER TUMOR (TURBT) WITH INSTILLATION OF EBICIN (N/A) as a surgical intervention .  The patient's history has been reviewed, patient examined, no change in status, stable for surgery.  I have reviewed the patient's chart and labs.  Questions were answered to the patient's satisfaction.     [name]

## 2016-04-03 NOTE — Transfer of Care (Signed)
Immediate Anesthesia Transfer of Care Note  Patient: [name]  Procedure(s) Performed: Procedure(s): CYSTOSCOPY/BILATERAL RETROGRADE PYLEOGRAM (N/A) TRANSURETHRAL RESECTION OF BLADDER TUMOR (TURBT) (N/A)  Patient Location: PACU  Anesthesia Type:General  Level of Consciousness:  sedated, patient cooperative and responds to stimulation  Airway & Oxygen Therapy:Patient Spontanous Breathing and Patient connected to face mask oxgen  Post-op Assessment:  Report given to PACU RN and Post -op Vital signs reviewed and stable  Post vital signs:  Reviewed and stable  Last Vitals:  Vitals:   04/03/16 1308 04/03/16 1322  BP: (!) 137/107 (!) 135/98  Pulse: 99   Resp: 18   Temp: [id number] C     Complications: No apparent anesthesia complications

## 2016-04-03 NOTE — Brief Op Note (Signed)
04/03/2016  6:01 PM  PATIENT:  [name]  65 y.o. male  PRE-OPERATIVE DIAGNOSIS:  BLADDER TUMOR  POST-OPERATIVE DIAGNOSIS:  BLADDER TUMOR   PROCEDURE:  Procedure(s): CYSTOSCOPY/BILATERAL RETROGRADE PYLEOGRAM (N/A) TRANSURETHRAL RESECTION OF BLADDER TUMOR (TURBT) (N/A) 1.5cm  INSTILLATION OF EPIRUBICIN (In [id number]  SURGEON:  Surgeon(s) and Role:    * Irine Seal, MD - Primary  PHYSICIAN ASSISTANT:   ASSISTANTS: none   ANESTHESIA:   general  EBL:  Total I/O In: 800 [I.V.:800] Out: 0   BLOOD ADMINISTERED:none  DRAINS: Urinary Catheter (Foley)   LOCAL MEDICATIONS USED:  NONE  SPECIMEN:  Source of Specimen:  bladder tumor biopsies.   DISPOSITION OF SPECIMEN:  PATHOLOGY  COUNTS:  YES  TOURNIQUET:  * No tourniquets in log *  DICTATION: .Other Dictation: Dictation Number [id number]  PLAN OF CARE: Discharge to home after PACU  PATIENT DISPOSITION:  PACU - hemodynamically stable.   Delay start of Pharmacological VTE agent (>24hrs) due to surgical blood loss or risk of bleeding: yes

## 2016-04-03 NOTE — Anesthesia Postprocedure Evaluation (Signed)
Anesthesia Post Note  Patient: [name]  Procedure(s) Performed: Procedure(s) (LRB): CYSTOSCOPY/BILATERAL RETROGRADE PYLEOGRAM (N/A) TRANSURETHRAL RESECTION OF BLADDER TUMOR (TURBT) (N/A)  Patient location during evaluation: PACU Anesthesia Type: General Level of consciousness: awake and alert Pain management: pain level controlled Vital Signs Assessment: post-procedure vital signs reviewed and stable Respiratory status: spontaneous breathing, nonlabored ventilation, respiratory function stable and patient connected to nasal cannula oxygen Cardiovascular status: blood pressure returned to baseline and stable Postop Assessment: no signs of nausea or vomiting Anesthetic complications: no    Last Vitals:  Vitals:   04/03/16 1630 04/03/16 1645  BP: (!) 147/98 (!) 147/93  Pulse: 96 89  Resp: 19 (!) 22  Temp:      Last Pain:  Vitals:   04/03/16 1308  TempSrc: Oral                 Alexis Frock

## 2016-04-04 ENCOUNTER — Encounter (HOSPITAL_COMMUNITY): Payer: Self-pay | Admitting: Urology

## 2016-04-04 NOTE — Op Note (Signed)
[name], DIEGEL NO.:  [id number]  MEDICAL RECORD NO.:  [id number]  LOCATION:  [facility]                         FACILITY:  [facility]  PHYSICIAN:  Marshall Cork. Jeffie Pollock, M.D.    DATE OF BIRTH:  1950-10-02  DATE OF PROCEDURE:  04/03/2016 DATE OF DISCHARGE:  04/03/2016                              OPERATIVE REPORT   PROCEDURES: 1. Cystoscopy with bilateral retrograde pyelograms and interpretation. 2. Transurethral resection of bladder tumor, 1.5 cm. 3. Instillation of epirubicin (in PACU).  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  SURGEON:  Marshall Cork. Jeffie Pollock, M.D.  ANESTHESIA:  General.  SPECIMEN:  Bladder tumor biopsies.  DRAINS:  An 18-French Foley catheter.  ESTIMATED BLOOD LOSS:  None.  COMPLICATIONS:  None.  INDICATIONS:  Mr. [name] is a 65 year old white male with a history of a prior bladder cancer, who was found on surveillance cystoscopy to have a tumor just lateral to the right ureteral orifice and was felt resection was indicated.  FINDINGS AND PROCEDURE:  The patient was given antibiotics.  He was taken to the operating room where general anesthetic was induced.  He was placed in lithotomy position.  His perineum and genitalia were prepped with Betadine solution and he was draped in usual sterile fashion.  He had been fitted with PAS hose.  Cystoscopy was performed using the 23-French scope and 30-degree lens. Examination revealed a normal urethra.  The external sphincter was intact.  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  The prostate was approximately 3 cm in length.  Inspection of the bladder revealed mild trabeculation and increased vascular density, consistent with his prior history of Cytoxan cystitis. The ureteral orifices were in the normal anatomic position.   Just lateral to the right ureteral orifice was a papillary tumor that appeared to measure approximately 8 to 10 mm in size.  Once inspection was performed, bilateral retrograde pyelography was performed using a 5-French open-end catheter and contrast.  The right retrograde pyelogram revealed a normal ureter and intrarenal collecting system.  The left retrograde pyelogram revealed a normal ureter and intrarenal collecting system.  After completion of the retrograde pyelograms, because of the location of the lesion just adjacent to the orifice, I felt that a cup biopsy forceps was most appropriate mechanism for removal.  The bulk of the tumor was removed with 1 bite, but there was some additional material around the base that required removal as well.  Once the biopsy had been completed, 28-French resectoscope sheath was inserted with the aid of a catheter guide and a bipolar loop with saline for irrigant was used to fulgurate the biopsy bed.  The area of fulguration was approximately 1.5 cm including the abnormal mucosa surrounding the base of the tumor.  Once fulguration was complete, inspection revealed intact ureteral orifices, no active bleeding, and the scope was removed.  An 18-French Foley catheter was inserted.  The balloon was filled with 10 mL of sterile fluid.  The catheter was irrigated with clear return and placed it to straight  drainage.  His anesthetic was reversed.  He was taken down from lithotomy position and he was moved to the recovery room in stable condition.  In the recovery room, he underwent instillation of 50 mg of epirubicin in 50 mL of diluent.  The catheter was then plugged and this was left indwelling 1 hour before the catheter was drained and removed.  There were no complications.     Marshall Cork. Jeffie Pollock, M.D.     JJW/MEDQ  D:  04/03/2016  T:  04/04/2016  Job:  [id number]

## 2016-04-10 DIAGNOSIS — C670 Malignant neoplasm of trigone of bladder: Secondary | ICD-10-CM | POA: Diagnosis not present

## 2016-04-10 NOTE — Addendum Note (Signed)
Addendum  created 04/10/16 1855 by Catalina Gravel, MD   Allergies modified, Sign clinical note

## 2016-04-10 NOTE — Hospital Discharge Follow-Up (Signed)
CC: Postoperative myalgias and left ear pressure  Briefly, patient had general endotracheal tube anesthesia on 04/03/16 and on POD 1 developed generalized myalgias that gradually improved over the next 3 days.  Per chart review, patient received succinylcholine during his anesthetic which has a known side effect of myalgias.  Patient also complained of left ear pressure upon emerging from anesthesia which is also gradually improving.  Per chart review, no mention made of abnormal head positioning.  Discussed side effects of succinylcholine with patient and assured that myalgias would continue to improve.  All patient questions answered.  Told patient that comment about myalgias related to succinylcholine would be placed in his chart for future surgeries.  Collins Scotland, MD Anesthesiology

## 2016-04-11 DIAGNOSIS — E785 Hyperlipidemia, unspecified: Secondary | ICD-10-CM | POA: Diagnosis not present

## 2016-04-11 DIAGNOSIS — N022 Recurrent and persistent hematuria with diffuse membranous glomerulonephritis: Secondary | ICD-10-CM | POA: Diagnosis not present

## 2016-04-11 DIAGNOSIS — N181 Chronic kidney disease, stage 1: Secondary | ICD-10-CM | POA: Diagnosis not present

## 2016-04-11 DIAGNOSIS — I129 Hypertensive chronic kidney disease with stage 1 through stage 4 chronic kidney disease, or unspecified chronic kidney disease: Secondary | ICD-10-CM | POA: Diagnosis not present

## 2016-05-16 DIAGNOSIS — C670 Malignant neoplasm of trigone of bladder: Secondary | ICD-10-CM | POA: Diagnosis not present

## 2016-05-16 DIAGNOSIS — Z5111 Encounter for antineoplastic chemotherapy: Secondary | ICD-10-CM | POA: Diagnosis not present

## 2016-05-16 DIAGNOSIS — C672 Malignant neoplasm of lateral wall of bladder: Secondary | ICD-10-CM | POA: Diagnosis not present

## 2016-05-23 DIAGNOSIS — C672 Malignant neoplasm of lateral wall of bladder: Secondary | ICD-10-CM | POA: Diagnosis not present

## 2016-05-23 DIAGNOSIS — C670 Malignant neoplasm of trigone of bladder: Secondary | ICD-10-CM | POA: Diagnosis not present

## 2016-05-23 DIAGNOSIS — Z5111 Encounter for antineoplastic chemotherapy: Secondary | ICD-10-CM | POA: Diagnosis not present

## 2016-05-24 ENCOUNTER — Emergency Department (HOSPITAL_COMMUNITY): Payer: BLUE CROSS/BLUE SHIELD

## 2016-05-24 ENCOUNTER — Observation Stay (HOSPITAL_COMMUNITY)
Admission: EM | Admit: 2016-05-24 | Discharge: 2016-05-26 | Disposition: A | Discharge status: Home / Self Care | Payer: BLUE CROSS/BLUE SHIELD | Attending: Family Medicine | Admitting: Family Medicine

## 2016-05-24 ENCOUNTER — Encounter (HOSPITAL_COMMUNITY): Payer: Self-pay

## 2016-05-24 DIAGNOSIS — Z7982 Long term (current) use of aspirin: Secondary | ICD-10-CM | POA: Diagnosis not present

## 2016-05-24 DIAGNOSIS — Z85828 Personal history of other malignant neoplasm of skin: Secondary | ICD-10-CM | POA: Insufficient documentation

## 2016-05-24 DIAGNOSIS — C679 Malignant neoplasm of bladder, unspecified: Secondary | ICD-10-CM | POA: Diagnosis not present

## 2016-05-24 DIAGNOSIS — Z8546 Personal history of malignant neoplasm of prostate: Secondary | ICD-10-CM | POA: Insufficient documentation

## 2016-05-24 DIAGNOSIS — R42 Dizziness and giddiness: Principal | ICD-10-CM

## 2016-05-24 DIAGNOSIS — K219 Gastro-esophageal reflux disease without esophagitis: Secondary | ICD-10-CM | POA: Insufficient documentation

## 2016-05-24 DIAGNOSIS — R739 Hyperglycemia, unspecified: Secondary | ICD-10-CM | POA: Insufficient documentation

## 2016-05-24 DIAGNOSIS — G319 Degenerative disease of nervous system, unspecified: Secondary | ICD-10-CM | POA: Diagnosis not present

## 2016-05-24 DIAGNOSIS — Z794 Long term (current) use of insulin: Secondary | ICD-10-CM | POA: Diagnosis not present

## 2016-05-24 DIAGNOSIS — R112 Nausea with vomiting, unspecified: Secondary | ICD-10-CM | POA: Diagnosis not present

## 2016-05-24 DIAGNOSIS — E785 Hyperlipidemia, unspecified: Secondary | ICD-10-CM

## 2016-05-24 DIAGNOSIS — C61 Malignant neoplasm of prostate: Secondary | ICD-10-CM | POA: Diagnosis present

## 2016-05-24 DIAGNOSIS — R531 Weakness: Secondary | ICD-10-CM | POA: Diagnosis not present

## 2016-05-24 DIAGNOSIS — I10 Essential (primary) hypertension: Secondary | ICD-10-CM | POA: Diagnosis not present

## 2016-05-24 DIAGNOSIS — Z8551 Personal history of malignant neoplasm of bladder: Secondary | ICD-10-CM | POA: Insufficient documentation

## 2016-05-24 DIAGNOSIS — R404 Transient alteration of awareness: Secondary | ICD-10-CM | POA: Diagnosis not present

## 2016-05-24 LAB — RAPID URINE DRUG SCREEN, HOSP PERFORMED
AMPHETAMINES: NOT DETECTED
BARBITURATES: NOT DETECTED
BENZODIAZEPINES: POSITIVE — AB
Cocaine: NOT DETECTED
Opiates: NOT DETECTED
Tetrahydrocannabinol: NOT DETECTED

## 2016-05-24 LAB — URINE MICROSCOPIC-ADD ON

## 2016-05-24 LAB — COMPREHENSIVE METABOLIC PANEL
ALT: 17 U/L (ref 17–63)
ANION GAP: 8 (ref 5–15)
AST: 25 U/L (ref 15–41)
Albumin: 3.5 g/dL (ref 3.5–5.0)
Alkaline Phosphatase: 77 U/L (ref 38–126)
BUN: 20 mg/dL (ref 6–20)
CHLORIDE: 109 mmol/L (ref 101–111)
CO2: 21 mmol/L — AB (ref 22–32)
CREATININE: 1.41 mg/dL — AB (ref 0.61–1.24)
Calcium: 8.9 mg/dL (ref 8.9–10.3)
GFR calc non Af Amer: 52 mL/min — ABNORMAL LOW (ref >60)
Glucose, Bld: 170 mg/dL — ABNORMAL HIGH (ref 65–99)
POTASSIUM: 4.5 mmol/L (ref 3.5–5.1)
SODIUM: 138 mmol/L (ref 135–145)
Total Bilirubin: 0.6 mg/dL (ref 0.3–1.2)
Total Protein: 6.1 g/dL — ABNORMAL LOW (ref 6.5–8.1)

## 2016-05-24 LAB — URINALYSIS, ROUTINE W REFLEX MICROSCOPIC
Bilirubin Urine: NEGATIVE
GLUCOSE, UA: NEGATIVE mg/dL
Ketones, ur: NEGATIVE mg/dL
Nitrite: POSITIVE — AB
PH: 5 (ref 5.0–8.0)
Specific Gravity, Urine: >1.03 — ABNORMAL HIGH (ref 1.005–1.030)

## 2016-05-24 LAB — DIFFERENTIAL
Basophils Absolute: 0 10*3/uL (ref 0.0–0.1)
Basophils Relative: 0 %
EOS ABS: 0 10*3/uL (ref 0.0–0.7)
Eosinophils Relative: 0 %
LYMPHS PCT: 10 %
Lymphs Abs: 1.4 10*3/uL (ref 0.7–4.0)
Monocytes Absolute: 1.1 10*3/uL — ABNORMAL HIGH (ref 0.1–1.0)
Monocytes Relative: 8 %
NEUTROS ABS: 11.1 10*3/uL — AB (ref 1.7–7.7)
NEUTROS PCT: 82 %

## 2016-05-24 LAB — CBC
HCT: 41.3 % (ref 39.0–52.0)
Hemoglobin: 14.1 g/dL (ref 13.0–17.0)
MCH: 33.6 pg (ref 26.0–34.0)
MCHC: 34.1 g/dL (ref 30.0–36.0)
MCV: 98.3 fL (ref 78.0–100.0)
PLATELETS: 188 10*3/uL (ref 150–400)
RBC: 4.2 MIL/uL — AB (ref 4.22–5.81)
RDW: 13.9 % (ref 11.5–15.5)
WBC: 13.6 10*3/uL — AB (ref 4.0–10.5)

## 2016-05-24 LAB — I-STAT TROPONIN, ED: Troponin i, poc: 0 ng/mL (ref 0.00–0.08)

## 2016-05-24 LAB — PROTIME-INR
INR: 1
PROTHROMBIN TIME: 13.2 s (ref 11.4–15.2)

## 2016-05-24 LAB — CBG MONITORING, ED: GLUCOSE-CAPILLARY: 99 mg/dL (ref 65–99)

## 2016-05-24 LAB — ETHANOL: Alcohol, Ethyl (B): <5 mg/dL (ref <5)

## 2016-05-24 LAB — APTT: aPTT: 28 seconds (ref 24–36)

## 2016-05-24 LAB — GLUCOSE, CAPILLARY: GLUCOSE-CAPILLARY: 95 mg/dL (ref 65–99)

## 2016-05-24 MED ORDER — LORAZEPAM 2 MG/ML IJ SOLN
0.5000 mg | Freq: Once | INTRAMUSCULAR | Status: AC
  Administered 2016-05-24: 0.5 mg via INTRAVENOUS
  Filled 2016-05-24: qty 1

## 2016-05-24 MED ORDER — ONDANSETRON HCL 4 MG/2ML IJ SOLN: 4.0000 mg | Freq: Once | INTRAMUSCULAR | Status: DC

## 2016-05-24 MED ORDER — PROMETHAZINE HCL 25 MG RE SUPP: 25.0000 mg | Freq: Four times a day (QID) | RECTAL | Status: DC | PRN

## 2016-05-24 MED ORDER — HYDRALAZINE HCL 20 MG/ML IJ SOLN: 5.0000 mg | Freq: Three times a day (TID) | INTRAMUSCULAR | Status: DC | PRN

## 2016-05-24 MED ORDER — SODIUM CHLORIDE 0.9 % IV SOLN
INTRAVENOUS | Status: DC
  Administered 2016-05-24 – 2016-05-26 (×2): via INTRAVENOUS

## 2016-05-24 MED ORDER — SODIUM CHLORIDE 0.9 % IV BOLUS (SEPSIS)
1000.0000 mL | Freq: Once | INTRAVENOUS | Status: AC
  Administered 2016-05-24: 1000 mL via INTRAVENOUS

## 2016-05-24 MED ORDER — PROMETHAZINE HCL 25 MG PO TABS: 25.0000 mg | ORAL_TABLET | Freq: Four times a day (QID) | ORAL | Status: DC | PRN

## 2016-05-24 MED ORDER — PROMETHAZINE HCL 25 MG/ML IJ SOLN
12.5000 mg | Freq: Once | INTRAMUSCULAR | Status: AC
  Administered 2016-05-24: 12.5 mg via INTRAVENOUS
  Filled 2016-05-24: qty 1

## 2016-05-24 MED ORDER — TELMISARTAN-AMLODIPINE 80-5 MG PO TABS: 0.5000 | ORAL_TABLET | Freq: Every evening | ORAL | Status: DC

## 2016-05-24 MED ORDER — ASPIRIN 325 MG PO TABS
325.0000 mg | ORAL_TABLET | Freq: Every day | ORAL | Status: DC
  Administered 2016-05-25 – 2016-05-26 (×2): 325 mg via ORAL
  Filled 2016-05-24 (×2): qty 1

## 2016-05-24 MED ORDER — INSULIN ASPART 100 UNIT/ML ~~LOC~~ SOLN: 0.0000 [IU] | Freq: Three times a day (TID) | SUBCUTANEOUS | Status: DC

## 2016-05-24 MED ORDER — MECLIZINE HCL 25 MG PO TABS
25.0000 mg | ORAL_TABLET | Freq: Three times a day (TID) | ORAL | Status: DC | PRN
  Administered 2016-05-25: 25 mg via ORAL
  Filled 2016-05-24: qty 1

## 2016-05-24 MED ORDER — SODIUM CHLORIDE 0.9 % IV SOLN: INTRAVENOUS | Status: DC

## 2016-05-24 MED ORDER — ONDANSETRON HCL 4 MG/2ML IJ SOLN
4.0000 mg | Freq: Four times a day (QID) | INTRAMUSCULAR | Status: DC | PRN
  Administered 2016-05-24: 4 mg via INTRAVENOUS
  Filled 2016-05-24: qty 2

## 2016-05-24 MED ORDER — PHENAZOPYRIDINE HCL 100 MG PO TABS
200.0000 mg | ORAL_TABLET | Freq: Three times a day (TID) | ORAL | Status: DC
  Filled 2016-05-24 (×2): qty 2

## 2016-05-24 MED ORDER — BISACODYL 10 MG RE SUPP: 10.0000 mg | Freq: Every day | RECTAL | Status: DC | PRN

## 2016-05-24 MED ORDER — ACETAMINOPHEN 650 MG RE SUPP: 650.0000 mg | Freq: Four times a day (QID) | RECTAL | Status: DC | PRN

## 2016-05-24 MED ORDER — AMLODIPINE BESYLATE 5 MG PO TABS
5.0000 mg | ORAL_TABLET | Freq: Every day | ORAL | Status: DC
  Filled 2016-05-24 (×2): qty 1

## 2016-05-24 MED ORDER — ONDANSETRON HCL 4 MG PO TABS: 4.0000 mg | ORAL_TABLET | Freq: Four times a day (QID) | ORAL | Status: DC | PRN

## 2016-05-24 MED ORDER — ACETAMINOPHEN 325 MG PO TABS
650.0000 mg | ORAL_TABLET | Freq: Four times a day (QID) | ORAL | Status: DC | PRN
Start: 2016-05-24 — End: 2016-05-26

## 2016-05-24 MED ORDER — LORATADINE 10 MG PO TABS
10.0000 mg | ORAL_TABLET | Freq: Every day | ORAL | Status: DC
  Administered 2016-05-25 – 2016-05-26 (×2): 10 mg via ORAL
  Filled 2016-05-24 (×2): qty 1

## 2016-05-24 MED ORDER — SODIUM CHLORIDE 0.9% FLUSH
3.0000 mL | Freq: Two times a day (BID) | INTRAVENOUS | Status: DC
  Administered 2016-05-25: 3 mL via INTRAVENOUS

## 2016-05-24 MED ORDER — SIMVASTATIN 40 MG PO TABS: 40.0000 mg | ORAL_TABLET | Freq: Every evening | ORAL | Status: DC

## 2016-05-24 MED ORDER — MECLIZINE HCL 25 MG PO TABS
25.0000 mg | ORAL_TABLET | Freq: Once | ORAL | Status: AC
  Administered 2016-05-24: 25 mg via ORAL
  Filled 2016-05-24: qty 1

## 2016-05-24 MED ORDER — HYDROCODONE-ACETAMINOPHEN 5-325 MG PO TABS: 1.0000 | ORAL_TABLET | ORAL | Status: DC | PRN

## 2016-05-24 MED ORDER — MAGNESIUM CITRATE PO SOLN: 1.0000 | Freq: Once | ORAL | Status: DC | PRN

## 2016-05-24 MED ORDER — IRBESARTAN 300 MG PO TABS
300.0000 mg | ORAL_TABLET | Freq: Every day | ORAL | Status: DC
  Administered 2016-05-25: 300 mg via ORAL
  Filled 2016-05-24 (×2): qty 1

## 2016-05-24 NOTE — ED Notes (Signed)
Pt vomits again after opening eyes.

## 2016-05-24 NOTE — ED Notes (Signed)
MD at bedside. 

## 2016-05-24 NOTE — ED Notes (Signed)
CBG- 99 

## 2016-05-24 NOTE — H&P (Signed)
History and Physical    [name] [id number] DOB: [birth year] DOA: 05/24/2016   PCP: Osborne Casco, MD   Patient coming from:  Home   Chief Complaint:  Vertigo   HPI: [name] is a 65 y.o. male with recent diagnosis of high grade urothelial carcinoma s/p TURP   not yet on therapy, presenting to the ED with acute onset of vertigo. He reported some dizziness last night before going to sleep, but theis morning his symptoms worsened. Ho was unable to move his head without feeling the room spinning. He also reports vomiting with minimal movement. He constantly feels he has to vomit. He is more comfortable lying on the left side, but the symptoms do not resolve completely. Denies any headaches. He denies any focal numbness or weakness. No double vision. No photophobia.  Recent issues with left ear pressure post op (cystoscopy and TURP ) on 8/17 but that had subsided since. Denies any vision changes, headaches. No dysarthria or dysphagia  No falls. Denies any confusion. No seizures reported. Denies any chest pain, palpitations, or shortness of breath. Denies any fever or chills, or night sweats.. Denies any abdominal pain, or diarrhea.He is not aware of any  any sick contacts or new foods or insect bites, bt he does fly quite frequently, last 2 days ago. Denies lower extremity swelling. Denies abnormal skin rashes, or neuropathy. Compliant with his medications. No history of stroke or family history of neurological disorders. No changes in medications . Never been seen by cardiologist. HAs not taken his BP meds today, takes them at night . He does take Claritin for allergies   ED Course:  BP 122/85   Pulse 83   Temp 97.7 F (36.5 C)   Resp 18   Ht 5\' 10"  (1.778 m)   Wt 112.9 kg (249 lb)   SpO2 98%   BMI 35.73 kg/m    Bicarb 21 Cr 1.41 o/w CMET normal . Tn 0, EKG normal  WBC 13.6 o/w normal CBC  Glu 170  ETOH neg LAst 2 D echo 03/2016 EF 50, Gr 1 DD, nl LV size , mild  diffuse hypokinesis  MRI pending   Review of Systems: As per HPI otherwise 10 point review of systems negative.   Past Medical History:  Diagnosis Date  . At risk for sleep apnea    STOP-BANG= 4     SENT TO PCP 06-14-2014  . Blood clot in bladder   . Elevated prostate specific antigen (PSA)   . Frequency of urination   . GERD (gastroesophageal reflux disease)   . Gross hematuria   . Hematuria   . Hernia, umbilical    small reoccurring hernia at present.  . History of eye injury    hx vitreous tear 2010-right eye- condition is stable 04-02-16  . History of melanoma excision    right foreman  . History of renal vein thrombosis   . Hx: nephrotic syndrome    Dr. [name] follows-will see soon for followup  . Hyperlipidemia   . Hypertension   . Inguinal hernia    surgery corrected.  . Malignant neoplasm of lateral wall of urinary bladder ([place])   . Personal history of urinary disorder   . Prostate cancer ([place])   . Skin cancer   . Syncope, near    03-30-16 recent visit in Hospital([facility])-discharged 04-01-16- related to Blood pressure and medications  . Urgency of urination     Past Surgical History:  Procedure Laterality Date  .  BLADDER INSTILLATION     "BCG" x2 tx. bladder cancer  . CHOLECYSTECTOMY  1989  . CYSTOSCOPY WITH BIOPSY N/A 06/15/2014   Procedure: CYSTOSCOPY CLOT EVACUATION ;  Surgeon: Malka So, MD;  Location: [facility];  Service: Urology;  Laterality: N/A;  . CYSTOSCOPY/RETROGRADE/URETEROSCOPY N/A 04/03/2016   Procedure: CYSTOSCOPY/BILATERAL RETROGRADE [id number];  Surgeon: Irine Seal, MD;  Location: [facility];  Service: Urology;  Laterality: N/A;  . EYE SURGERY Right    laser   . INGUINAL HERNIA REPAIR Right 05-26-2011  . MELANOMA EXCISION  2010   right forearm  . PROSTATE BIOPSY     x2  . TRANSURETHRAL RESECTION OF BLADDER TUMOR N/A 04/03/2016   Procedure: TRANSURETHRAL RESECTION OF BLADDER TUMOR (TURBT);  Surgeon: Irine Seal, MD;  Location: [facility];  Service: Urology;  Laterality: N/A;  . TRANSURETHRAL RESECTION OF BLADDER TUMOR WITH GYRUS (TURBT-GYRUS) N/A 06/15/2014   Procedure: TRANSURETHRAL RESECTION OF BLADDER TUMOR WITH GYRUS (TURBT-GYRUS);  Surgeon: Malka So, MD;  Location: [facility];  Service: Urology;  Laterality: N/A;    Social History Social History   Social History  . Marital status: Married    Spouse name: N/A  . Number of children: N/A  . Years of education: N/A   Occupational History  . Not on file.   Social History Main Topics  . Smoking status: Never Smoker  . Smokeless tobacco: Never Used  . Alcohol use Yes     Comment: rare  . Drug use: No  . Sexual activity: Yes   Other Topics Concern  . Not on file   Social History Narrative  . No narrative on file     Allergies  Allergen Reactions  . Vibramycin [Doxycycline Calcium] Hives, Nausea Only and Other (See Comments)    fever  . Mushroom Extract Complex Hives and Nausea And Vomiting  . Succinylcholine Chloride Other (See Comments)    Postoperative myalgia    Family History  Problem Relation Age of Onset  . Lung cancer Father   . Heart disease Mother   . Breast cancer Sister   . Lupus Sister       Prior to Admission medications   Medication Sig Start Date End Date Taking? Authorizing Provider  acetaminophen (TYLENOL) 500 MG tablet Take 500 mg by mouth every 6 (six) hours as needed for mild pain.   Yes Historical Provider, MD  aspirin 325 MG tablet Take 325 mg by mouth daily.     Yes Historical Provider, MD  loratadine (CLARITIN) 10 MG tablet Take 10 mg by mouth daily.   Yes Historical Provider, MD  phenazopyridine (PYRIDIUM) 200 MG tablet Take 1 tablet (200 mg total) by mouth 3 (three) times daily as needed for pain. 04/03/16  Yes Irine Seal, MD  simvastatin (ZOCOR) 40 MG tablet Take 40 mg by mouth every evening.    Yes Historical Provider, MD  Telmisartan-Amlodipine (TWYNSTA) 80-5 MG TABS Take 0.5 tablets by mouth  every evening. Patient taking 1/2 tablet daily.   Yes Historical Provider, MD  HYDROcodone-acetaminophen (NORCO) 5-325 MG tablet Take 1 tablet by mouth every 6 (six) hours as needed for moderate pain. Patient not taking: Reported on 05/24/2016 04/03/16   Irine Seal, MD    Physical Exam:    Vitals:   05/24/16 1019 05/24/16 1023 05/24/16 1342 05/24/16 1510  BP:    122/85  Pulse:    83  Resp:    18  Temp:   97.7 F (36.5  C)   SpO2: 97%   98%  Weight:  112.9 kg (249 lb)    Height:  5\' 10"  (1.778 m)         Constitutional: Very uncomfortable due to nausea and vertigo. Wearing a towel on his face. Refuses to remove the towel. Vitals:   05/24/16 1019 05/24/16 1023 05/24/16 1342 05/24/16 1510  BP:    122/85  Pulse:    83  Resp:    18  Temp:   97.7 F (36.5 C)   SpO2: 97%   98%  Weight:  112.9 kg (249 lb)    Height:  5\' 10"  (1.778 m)     Eyes:Unable to examine as patient refuses to move towel from face  ENMT: Mucous membranes are moist. Posterior pharynx clear of any exudate or lesions.Normal dentition.  Neck: normal, supple, no masses, no thyromegaly Respiratory: clear to auscultation bilaterally, no wheezing, no crackles. Normal respiratory effort. No accessory muscle use.  Cardiovascular: Regular rate and rhythm, no murmurs / rubs / gallops. No extremity edema. 2+ pedal pulses. No carotid bruits.  Abdomen:  Obese no tenderness, no masses palpated. No hepatosplenomegaly. Bowel sounds positive.  Musculoskeletal: no clubbing / cyanosis. No joint deformity upper and lower extremities. Good ROM, no contractures. Normal muscle tone.  Skin: no rashes, lesions, ulcers.  Neurologic: CN 2-12 grossly intact. Sensation intact, DTR normal. Strength 5/5 in all 4. Unable to evaluate gait  Psychiatric: Normal judgment and insight. Alert and oriented x 3. Normal mood.     Labs on Admission: I have personally reviewed following labs and imaging studies  CBC:  Recent Labs Lab  05/24/16 1156  WBC 13.6*  NEUTROABS 11.1*  HGB 14.1  HCT 41.3  MCV 98.3  PLT 0000000    Basic Metabolic Panel:  Recent Labs Lab 05/24/16 1156  NA 138  K 4.5  CL 109  CO2 21*  GLUCOSE 170*  BUN 20  CREATININE 1.41*  CALCIUM 8.9    GFR: Estimated Creatinine Clearance: 68.4 mL/min (by C-G formula based on SCr of 1.41 mg/dL (H)).  Liver Function Tests:  Recent Labs Lab 05/24/16 1156  AST 25  ALT 17  ALKPHOS 77  BILITOT 0.6  PROT 6.1*  ALBUMIN 3.5   No results for input(s): LIPASE, AMYLASE in the last 168 hours. No results for input(s): AMMONIA in the last 168 hours.  Coagulation Profile:  Recent Labs Lab 05/24/16 1156  INR 1.00    Cardiac Enzymes: No results for input(s): CKTOTAL, CKMB, CKMBINDEX, TROPONINI in the last 168 hours.  BNP (last 3 results) No results for input(s): PROBNP in the last 8760 hours.  HbA1C: No results for input(s): HGBA1C in the last 72 hours.  CBG: No results for input(s): GLUCAP in the last 168 hours.  Lipid Profile: No results for input(s): CHOL, HDL, LDLCALC, TRIG, CHOLHDL, LDLDIRECT in the last 72 hours.  Thyroid Function Tests: No results for input(s): TSH, T4TOTAL, FREET4, T3FREE, THYROIDAB in the last 72 hours.  Anemia Panel: No results for input(s): VITAMINB12, FOLATE, FERRITIN, TIBC, IRON, RETICCTPCT in the last 72 hours.  Urine analysis:    Component Value Date/Time   COLORURINE YELLOW 03/30/2016 1915   APPEARANCEUR CLOUDY (A) 03/30/2016 1915   LABSPEC 1.025 03/30/2016 1915   PHURINE 5.5 03/30/2016 1915   GLUCOSEU NEGATIVE 03/30/2016 1915   HGBUR MODERATE (A) 03/30/2016 1915   BILIRUBINUR SMALL (A) 03/30/2016 1915   KETONESUR 15 (A) 03/30/2016 1915   PROTEINUR >300 (A) 03/30/2016 1915  UROBILINOGEN 1.0 05/16/2009 0839   NITRITE NEGATIVE 03/30/2016 1915   LEUKOCYTESUR NEGATIVE 03/30/2016 1915    Sepsis Labs: @LABRCNTIP (procalcitonin:4,lacticidven:4) )No results found for this or any previous visit  (from the past 240 hour(s)).   Radiological Exams on Admission: Ct Head Code Stroke W/o Cm  Result Date: 05/24/2016 CLINICAL DATA:  Code stroke. Dizziness beginning last night, worsening this morning. EXAM: CT HEAD WITHOUT CONTRAST TECHNIQUE: Contiguous axial images were obtained from the base of the skull through the vertex without intravenous contrast. COMPARISON:  MRI 03/30/2016 FINDINGS: Brain: Mild generalized volume loss. Minimal small vessel changes of the hemispheric white matter, better seen at MRI. No sign of acute infarction, mass lesion, hemorrhage, hydrocephalus or extra-axial collection. Vascular: There is atherosclerotic calcification of the major vessels at the base of the brain. Skull: Normal Sinuses/Orbits: Clear/normal Other: None significant ASPECTS ([place] Stroke Program Early CT Score) - Ganglionic level infarction (caudate, lentiform nuclei, internal capsule, insula, M1-M3 cortex): 7 - Supraganglionic infarction (M4-M6 cortex): 3 Total score (0-10 with 10 being normal): 10 IMPRESSION: 1. No acute finding. Mild chronic small-vessel ischemic change of the cerebral hemispheric white matter. 2. ASPECTS is 10 These results were called by telephone at the time of interpretation on 05/24/2016 at 12:37 pm to Dr. Dorie Rank , who verbally acknowledged these results. Electronically Signed   By: Nelson Chimes M.D.   On: 05/24/2016 12:38    EKG: Independently reviewed.  Assessment/Plan Active Problems:   Vertigo   Bladder cancer (HCC)   Malignant neoplasm of prostate (HCC)   HTN (hypertension)   Hyperlipidemia   Dizziness and vertigo : Etiology unknown medication induced vs infection vs other cause  Patient is nauseous, weak, and was unable to stand due to gait instability. CT head  without evidence of acute stroke or other significant abnormality.Received Ativan, Antivert , Phenergan IV  And IVF 1 l without significant improvement of symptoms.  Bicarb 21Cr 1.41 o/w CMET normal .Tn 0, EKG  without ACS ,  WBC 13.6 o/w normal CBC Glu 170,  ETOH neg, Last 2 D echo 03/2016 EF 50, Gr 1 DD, nl LV size , mild diffuse hypokinesis . MRI brain pending  - Telemetry, observation - Meclizine when necessary dizziness - IVF  - Neurology consult if not improving in a.m. - Hold beta blocker   - orthostatics when able    EKG in am   UDS  UA and culture  Blood cultures   Hypertension BP 122/85   Pulse 83   Temp 97.7 F (36.5 C)   Resp 18   Ht 5\' 10"  (1.778 m)   Wt 112.9 kg (249 lb)   SpO2 98%   BMI 35.73 kg/m  Controlled Continue home anti-hypertensive medications in am  Add Hydralazine Q6 hours as needed for BP 210/100   Hyperlipidemia Continue home statins  Elevated blood sugar  Current blood sugar level is 170 No results found for: HGBA1C Hgb A1C SSI Heart healthy carb modified diet when able   high grade urothelial carcinoma s/p TURP 8/17 , not yet on therapy PAtient has some clots in urine, not a new event To follow as OP, unless it does not resolve, then to call Urology consult.   DVT prophylaxis: SCDs  Code Status:   Full    Family Communication:  Discussed with patient Disposition Plan: Expect patient to be discharged to home after condition improves Consults called:   Neuro  Admission status:Tele  Obs   Rondel Jumbo, PA-C [facility]  If 7PM-7AM, please contact night-coverage [email or web address] Password [email or web address]  05/24/2016, 4:12 PM

## 2016-05-24 NOTE — ED Triage Notes (Signed)
Pt arrives [facility] with c/o sever nausea and vomiting and dizziness since 0730 this am. Pt had bladder injection for CA yesterday .

## 2016-05-24 NOTE — ED Notes (Signed)
Vital signs stable. 

## 2016-05-24 NOTE — ED Notes (Signed)
Attempted to call report to 5W 

## 2016-05-24 NOTE — ED Provider Notes (Signed)
[facility] DEPT Provider Note   CSN: [id number] Arrival date & time: 05/24/16  1012   History   Chief Complaint Chief Complaint  Patient presents with  . Emesis  . Dizziness    HPI [name] is a 65 y.o. male.  HPI Patient presents to the emergency room with complaints of acute dizziness, vomiting and vertigo.  Patient states last evening he started having some mild complaints of dizziness. He was not having any vomiting and he was not having any issues walking. During the night he was able to get up and use the restroom. He had sudden onset of symptoms at about 0 8:30 this morning when he became acutely nauseated and had more severe vertigo. Whenever he opens his eyes he feels like he has to vomit. He feels like the room is moving. Movement makes it worse. He is more comfortable lying on his left side although the symptoms do not ever resolve completely.  He denies any headache. No focal numbness or weakness. No double vision. Past Medical History:  Diagnosis Date  . At risk for sleep apnea    STOP-BANG= 4     SENT TO PCP 06-14-2014  . Blood clot in bladder   . Elevated prostate specific antigen (PSA)   . Frequency of urination   . GERD (gastroesophageal reflux disease)   . Gross hematuria   . Hematuria   . Hernia, umbilical    small reoccurring hernia at present.  . History of eye injury    hx vitreous tear 2010-right eye- condition is stable 04-02-16  . History of melanoma excision    right foreman  . History of renal vein thrombosis   . Hx: nephrotic syndrome    Dr. [name] follows-will see soon for followup  . Hyperlipidemia   . Hypertension   . Inguinal hernia    surgery corrected.  . Malignant neoplasm of lateral wall of urinary bladder ([place])   . Personal history of urinary disorder   . Prostate cancer ([place])   . Skin cancer   . Syncope, near    03-30-16 recent visit in Hospital([facility])-discharged 04-01-16- related to Blood pressure and medications  .  Urgency of urination     Patient Active Problem List   Diagnosis Date Noted  . Near syncope 03/30/2016  . HTN (hypertension) 03/30/2016  . Malignant neoplasm of prostate ([place]) 12/22/2014  . Bladder cancer ([place]) 06/15/2014  . Inguinal hernia 03/27/2011    Past Surgical History:  Procedure Laterality Date  . BLADDER INSTILLATION     "BCG" x2 tx. bladder cancer  . CHOLECYSTECTOMY  1989  . CYSTOSCOPY WITH BIOPSY N/A 06/15/2014   Procedure: CYSTOSCOPY CLOT EVACUATION ;  Surgeon: Malka So, MD;  Location: [facility];  Service: Urology;  Laterality: N/A;  . CYSTOSCOPY/RETROGRADE/URETEROSCOPY N/A 04/03/2016   Procedure: CYSTOSCOPY/BILATERAL RETROGRADE [id number];  Surgeon: Irine Seal, MD;  Location: [facility];  Service: Urology;  Laterality: N/A;  . EYE SURGERY Right    laser   . INGUINAL HERNIA REPAIR Right 05-26-2011  . MELANOMA EXCISION  2010   right forearm  . PROSTATE BIOPSY     x2  . TRANSURETHRAL RESECTION OF BLADDER TUMOR N/A 04/03/2016   Procedure: TRANSURETHRAL RESECTION OF BLADDER TUMOR (TURBT);  Surgeon: Irine Seal, MD;  Location: [facility];  Service: Urology;  Laterality: N/A;  . TRANSURETHRAL RESECTION OF BLADDER TUMOR WITH GYRUS (TURBT-GYRUS) N/A 06/15/2014   Procedure: TRANSURETHRAL RESECTION OF BLADDER TUMOR WITH GYRUS (TURBT-GYRUS);  Surgeon: Jenny Reichmann  Keene Breath, MD;  Location: [facility];  Service: Urology;  Laterality: N/A;       Home Medications    Prior to Admission medications   Medication Sig Start Date End Date Taking? Authorizing Provider  aspirin 325 MG tablet Take 325 mg by mouth daily.      Historical Provider, MD  HYDROcodone-acetaminophen (NORCO) 5-325 MG tablet Take 1 tablet by mouth every 6 (six) hours as needed for moderate pain. 04/03/16   Irine Seal, MD  phenazopyridine (PYRIDIUM) 200 MG tablet Take 1 tablet (200 mg total) by mouth 3 (three) times daily as needed for pain. 04/03/16   Irine Seal, MD  simvastatin (ZOCOR) 40 MG  tablet Take 40 mg by mouth every evening.     Historical Provider, MD  Telmisartan-Amlodipine (TWYNSTA) 80-5 MG TABS Take 0.5 tablets by mouth every evening. Patient taking 1/2 tablet daily.    Historical Provider, MD    Family History Family History  Problem Relation Age of Onset  . Lung cancer Father   . Heart disease Mother   . Breast cancer Sister   . Lupus Sister     Social History Social History  Substance Use Topics  . Smoking status: Never Smoker  . Smokeless tobacco: Never Used  . Alcohol use Yes     Comment: rare     Allergies   Vibramycin [doxycycline calcium] and Succinylcholine chloride   Review of Systems Review of Systems  All other systems reviewed and are negative.    Physical Exam Updated Vital Signs Ht 5\' 10"  (1.778 m)   Wt 112.9 kg   SpO2 97%   BMI 35.73 kg/m   Physical Exam  Constitutional: He is oriented to person, place, and time. He appears distressed.  Patient appears uncomfortable, constantly spitting in an emesis bag  HENT:  Head: Normocephalic and atraumatic.  Right Ear: Tympanic membrane and external ear normal.  Left Ear: Tympanic membrane and external ear normal.  Mouth/Throat: Oropharynx is clear and moist.  Eyes: Conjunctivae are normal. Right eye exhibits no discharge. Left eye exhibits no discharge. No scleral icterus.  Neck: Neck supple. No tracheal deviation present.  Cardiovascular: Normal rate, regular rhythm and intact distal pulses.   Pulmonary/Chest: Effort normal and breath sounds normal. No stridor. No respiratory distress. He has no wheezes. He has no rales.  Abdominal: Soft. Bowel sounds are normal. He exhibits no distension. There is no tenderness. There is no rebound and no guarding.  Musculoskeletal: He exhibits no edema or tenderness.  Neurological: He is alert and oriented to person, place, and time. He has normal strength. No cranial nerve deficit (No facial droop, extraocular movements intact, tongue midline )  or sensory deficit. He exhibits normal muscle tone. He displays no seizure activity. Coordination normal.  5 out of 5 bilateral grip strength and plantar flexion bilaterally, sensation intact in all extremities, , no left or right sided neglect, the patient is unable to keep his eyes open without having to vomit, unable to perform finger to nose exam for check visual fields acutely for this reason  Skin: Skin is warm and dry. No rash noted.  Psychiatric: He has a normal mood and affect.  Nursing note and vitals reviewed.    ED Treatments / Results  Labs (all labs ordered are listed, but only abnormal results are displayed) Labs Reviewed - No data to display  EKG  EKG Interpretation  Date/Time:  Saturday May 24 2016 10:22:36 EDT Ventricular Rate:  85 PR  Interval:    QRS Duration: 108 QT Interval:  408 QTC Calculation: 486 R Axis:   69 Text Interpretation:  Sinus rhythm Borderline prolonged QT interval No significant change since last tracing Confirmed by Kimiyo Carmicheal  MD-J, Zyriah Mask [id number]) on 05/24/2016 10:28:18 AM       Radiology Ct Head Code Stroke W/o Cm  Result Date: 05/24/2016 CLINICAL DATA:  Code stroke. Dizziness beginning last night, worsening this morning. EXAM: CT HEAD WITHOUT CONTRAST TECHNIQUE: Contiguous axial images were obtained from the base of the skull through the vertex without intravenous contrast. COMPARISON:  MRI 03/30/2016 FINDINGS: Brain: Mild generalized volume loss. Minimal small vessel changes of the hemispheric white matter, better seen at MRI. No sign of acute infarction, mass lesion, hemorrhage, hydrocephalus or extra-axial collection. Vascular: There is atherosclerotic calcification of the major vessels at the base of the brain. Skull: Normal Sinuses/Orbits: Clear/normal Other: None significant ASPECTS ([place] Stroke Program Early CT Score) - Ganglionic level infarction (caudate, lentiform nuclei, internal capsule, insula, M1-M3 cortex): 7 - Supraganglionic  infarction (M4-M6 cortex): 3 Total score (0-10 with 10 being normal): 10 IMPRESSION: 1. No acute finding. Mild chronic small-vessel ischemic change of the cerebral hemispheric white matter. 2. ASPECTS is 10 These results were called by telephone at the time of interpretation on 05/24/2016 at 12:37 pm to Dr. Dorie Rank , who verbally acknowledged these results. Electronically Signed   By: Nelson Chimes M.D.   On: 05/24/2016 12:38    Procedures Procedures (including critical care time)  Medications Ordered in ED Medications  sodium chloride 0.9 % bolus 1,000 mL (not administered)  0.9 %  sodium chloride infusion (not administered)  LORazepam (ATIVAN) injection 0.5 mg (0.5 mg Intravenous Given 05/24/16 1113)  promethazine (PHENERGAN) injection 12.5 mg (12.5 mg Intravenous Given 05/24/16 1128)  meclizine (ANTIVERT) tablet 25 mg (25 mg Oral Given 05/24/16 1345)  promethazine (PHENERGAN) injection 12.5 mg (12.5 mg Intravenous Given 05/24/16 1532)     Initial Impression / Assessment and Plan / ED Course  I have reviewed the triage vital signs and the nursing notes.  Pertinent labs & imaging results that were available during my care of the patient were reviewed by me and considered in my medical decision making (see chart for details).  Clinical Course  Comment By Time  Discussed the CT findings and labs.  Somewhat better but still not able to move head or sit up.  WIll give dose of phenergan .  MRI brain to rule out acute stroke.  Did have an MRA 2 months ago that was without any acute findings Dorie Rank, MD 10/07 1252  Pt is having persistent vomiting and dizziness.  Will given another dose of meclizine.  MRI pending Dorie Rank, MD 10/07 1520  COnsider his persistent vertigo, will admit.  Suspect acute labrynthitis Dorie Rank, MD 10/07 1600     Final Clinical Impressions(s) / ED Diagnoses   Final diagnoses:  Vertigo  Non-intractable vomiting with nausea, unspecified vomiting type      Dorie Rank, MD 05/24/16 1600

## 2016-05-24 NOTE — ED Notes (Signed)
Patient transported to CT 

## 2016-05-24 NOTE — ED Notes (Signed)
Attempted to call report. Charge nurse also not available to take report

## 2016-05-25 DIAGNOSIS — E785 Hyperlipidemia, unspecified: Secondary | ICD-10-CM | POA: Diagnosis not present

## 2016-05-25 DIAGNOSIS — R42 Dizziness and giddiness: Secondary | ICD-10-CM

## 2016-05-25 DIAGNOSIS — C679 Malignant neoplasm of bladder, unspecified: Secondary | ICD-10-CM

## 2016-05-25 DIAGNOSIS — I10 Essential (primary) hypertension: Secondary | ICD-10-CM

## 2016-05-25 DIAGNOSIS — C61 Malignant neoplasm of prostate: Secondary | ICD-10-CM

## 2016-05-25 LAB — CBC
HCT: 36.9 % — ABNORMAL LOW (ref 39.0–52.0)
Hemoglobin: 12.3 g/dL — ABNORMAL LOW (ref 13.0–17.0)
MCH: 33.3 pg (ref 26.0–34.0)
MCHC: 33.3 g/dL (ref 30.0–36.0)
MCV: 100 fL (ref 78.0–100.0)
PLATELETS: 162 10*3/uL (ref 150–400)
RBC: 3.69 MIL/uL — ABNORMAL LOW (ref 4.22–5.81)
RDW: 13.9 % (ref 11.5–15.5)
WBC: 9.1 10*3/uL (ref 4.0–10.5)

## 2016-05-25 LAB — COMPREHENSIVE METABOLIC PANEL
ALBUMIN: 2.7 g/dL — AB (ref 3.5–5.0)
ALT: 14 U/L — ABNORMAL LOW (ref 17–63)
AST: 21 U/L (ref 15–41)
Alkaline Phosphatase: 60 U/L (ref 38–126)
Anion gap: 9 (ref 5–15)
BUN: 15 mg/dL (ref 6–20)
CALCIUM: 8.3 mg/dL — AB (ref 8.9–10.3)
CO2: 22 mmol/L (ref 22–32)
CREATININE: 1.31 mg/dL — AB (ref 0.61–1.24)
Chloride: 110 mmol/L (ref 101–111)
GFR calc non Af Amer: 57 mL/min — ABNORMAL LOW (ref >60)
GLUCOSE: 83 mg/dL (ref 65–99)
Potassium: 4.1 mmol/L (ref 3.5–5.1)
Sodium: 141 mmol/L (ref 135–145)
Total Bilirubin: 0.9 mg/dL (ref 0.3–1.2)
Total Protein: 5.2 g/dL — ABNORMAL LOW (ref 6.5–8.1)

## 2016-05-25 LAB — URINE CULTURE: CULTURE: NO GROWTH

## 2016-05-25 LAB — GLUCOSE, CAPILLARY
GLUCOSE-CAPILLARY: 101 mg/dL — AB (ref 65–99)
Glucose-Capillary: 104 mg/dL — ABNORMAL HIGH (ref 65–99)

## 2016-05-25 LAB — HEMOGLOBIN A1C
HEMOGLOBIN A1C: 5.1 % (ref 4.8–5.6)
Mean Plasma Glucose: 100 mg/dL

## 2016-05-25 MED ORDER — ENOXAPARIN SODIUM 60 MG/0.6ML ~~LOC~~ SOLN
0.5000 mg/kg | SUBCUTANEOUS | Status: DC
  Administered 2016-05-25: 55 mg via SUBCUTANEOUS
  Filled 2016-05-25: qty 0.6

## 2016-05-25 MED ORDER — TELMISARTAN-AMLODIPINE 80-5 MG PO TABS
1.0000 | ORAL_TABLET | Freq: Every day | ORAL | Status: DC
  Filled 2016-05-25: qty 1

## 2016-05-25 MED ORDER — ATORVASTATIN CALCIUM 20 MG PO TABS
20.0000 mg | ORAL_TABLET | Freq: Every day | ORAL | Status: DC
  Administered 2016-05-25: 20 mg via ORAL
  Filled 2016-05-25: qty 1

## 2016-05-25 MED ORDER — AMLODIPINE BESYLATE 5 MG PO TABS: 5.0000 mg | ORAL_TABLET | Freq: Every day | ORAL | Status: DC

## 2016-05-25 MED ORDER — IRBESARTAN 300 MG PO TABS: 300.0000 mg | ORAL_TABLET | Freq: Every day | ORAL | Status: DC

## 2016-05-25 NOTE — Progress Notes (Signed)
PROGRESS NOTE    [name]  [id number] DOB: [birth year] DOA: 05/24/2016 PCP: Osborne Casco, MD   Brief Narrative: [name] is a 65 y.o. male with a history of high grade urothelial carcinoma s/p TURP. He presents with severe dizziness and vomiting. MRI negative for acute infarct. Symptoms improved with time and meclizine.   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 Unlikely related to medication. No evidence of infection. MRI negative for acute infarct. Improved with time and meclizine -continue meclizine -continue IVF -orthostatic vitals -follow-up urine/blood culture  Hypertension -continue telmisartan-amlodipine  Hyperlipidemia -continue atorvastatin  Elevated blood sugar A1C of 5.1 -continue SSI  DVT prophylaxis: lovenox subq Code Status: Full code Family Communication: Wife at bedside Disposition Plan: Likely discharge this evening or tomorrow pending improvement of symptoms and PT eval   Consultants:   Physical therapy  Procedures:   None  Antimicrobials:  None    Subjective: Patient reports feeling better. Symptoms greatly improved. He still has some dizziness and mild nausea.  Objective: Vitals:   05/24/16 1815 05/24/16 2130 05/24/16 2132 05/25/16 0651  BP: 106/62 (!) 86/31 123/76 116/64  Pulse:  80 75 82  Resp: 19  18 16   Temp:    98.3 F (36.8 C)  TempSrc:    Oral  SpO2:  97%  96%  Weight:    105.3 kg (232 lb 1.6 oz)  Height:        Intake/Output Summary (Last 24 hours) at 05/25/16 1521 Last data filed at 05/25/16 0653  Gross per 24 hour  Intake          1103.33 ml  Output              375 ml  Net           728.33 ml   Filed Weights   05/24/16 1023 05/25/16 0651  Weight: 112.9 kg (249 lb) 105.3 kg (232 lb 1.6 oz)    Examination:  General exam: Appears calm and  comfortable Respiratory system: Clear to auscultation. Respiratory effort normal. Cardiovascular system: S1 & S2 heard, RRR. No murmurs, rubs, gallops or clicks. Gastrointestinal system: Abdomen is nondistended, soft and nontender. No organomegaly or masses felt. Normal bowel sounds heard. Central nervous system: Alert and oriented. No focal neurological deficits. No nystagmus. Reflexes 2+ and equal bilaterally in biceps, brachial, patellar, achilles. 5/5 strength of upper/lower extremity. No dysdiadochokinesia. Normal finger-to-nose.  Extremities: No edema. No calf tenderness Skin: No cyanosis. No rashes Psychiatry: Judgement and insight appear normal. Mood & affect appropriate.     Data Reviewed: I have personally reviewed following labs and imaging studies  CBC:  Recent Labs Lab 05/24/16 1156 05/25/16 0541  WBC 13.6* 9.1  NEUTROABS 11.1*  --   HGB 14.1 12.3*  HCT 41.3 36.9*  MCV 98.3 100.0  PLT 188 0000000   Basic Metabolic Panel:  Recent Labs Lab 05/24/16 1156 05/25/16 0541  NA 138 141  K 4.5 4.1  CL 109 110  CO2 21* 22  GLUCOSE 170* 83  BUN 20 15  CREATININE 1.41* 1.31*  CALCIUM 8.9 8.3*   GFR: Estimated Creatinine Clearance: 71 mL/min (by C-G formula based on SCr of 1.31 mg/dL (H)). Liver Function Tests:  Recent Labs Lab 05/24/16 1156 05/25/16 0541  AST 25 21  ALT 17 14*  ALKPHOS  77 60  BILITOT 0.6 0.9  PROT 6.1* 5.2*  ALBUMIN 3.5 2.7*   No results for input(s): LIPASE, AMYLASE in the last 168 hours. No results for input(s): AMMONIA in the last 168 hours. Coagulation Profile:  Recent Labs Lab 05/24/16 1156  INR 1.00   Cardiac Enzymes: No results for input(s): CKTOTAL, CKMB, CKMBINDEX, TROPONINI in the last 168 hours. BNP (last 3 results) No results for input(s): PROBNP in the last 8760 hours. HbA1C:  Recent Labs  05/24/16 1655  HGBA1C 5.1   CBG:  Recent Labs Lab 05/24/16 1707 05/24/16 2129 05/25/16 1150  GLUCAP 99 95 104*   Lipid  Profile: No results for input(s): CHOL, HDL, LDLCALC, TRIG, CHOLHDL, LDLDIRECT in the last 72 hours. Thyroid Function Tests: No results for input(s): TSH, T4TOTAL, FREET4, T3FREE, THYROIDAB in the last 72 hours. Anemia Panel: No results for input(s): VITAMINB12, FOLATE, FERRITIN, TIBC, IRON, RETICCTPCT in the last 72 hours. Sepsis Labs: No results for input(s): PROCALCITON, LATICACIDVEN in the last 168 hours.  Recent Results (from the past 240 hour(s))  Culture, Urine     Status: None   Collection Time: 05/24/16  2:30 PM  Result Value Ref Range Status   Specimen Description URINE, CLEAN CATCH  Final   Special Requests NONE  Final   Culture NO GROWTH  Final   Report Status 05/25/2016 FINAL  Final         Radiology Studies: Mr Brain Wo Contrast  Result Date: 05/24/2016 CLINICAL DATA:  Initial evaluation for acute dizziness, vomiting. Question possible stroke. EXAM: MRI HEAD WITHOUT CONTRAST TECHNIQUE: Multiplanar, multiecho pulse sequences of the brain and surrounding structures were obtained without intravenous contrast. COMPARISON:  [facility] CT from earlier the same day as well as previous MRI from 03/30/2016. FINDINGS: Brain: Cerebral volume is stable. Mild scattered T2/FLAIR hyperintense foci involving the periventricular and deep white matter of both cerebral hemispheres again noted, also stable. Findings are nonspecific, but most like related to mild chronic microvascular ischemic disease. No abnormal foci of restricted diffusion to suggest acute or subacute ischemia. Gray-white matter differentiation maintained. No acute or chronic intracranial hemorrhage. No areas of chronic infarction. No mass lesion, midline shift, or mass effect. No hydrocephalus. No extra-axial fluid collection. Major dural sinuses are grossly patent. Vascular: Major intracranial vascular flow voids well maintained. Skull and upper cervical spine: Craniocervical junction within normal limits. Visualized upper  cervical spine unremarkable. Bone marrow signal intensity within normal limits. No scalp soft tissue abnormality. Sinuses/Orbits: The globes and orbital soft tissues within normal limits. Paranasal sinuses are largely clear. No mastoid effusion. Inner ear structures within normal limits. Other: No other significant finding. IMPRESSION: 1. No acute intracranial infarct or other process identified. 2. Stable age-related cerebral atrophy with mild chronic microvascular ischemic disease. Electronically Signed   By: Jeannine Boga M.D.   On: 05/24/2016 17:13   Ct Head Code Stroke W/o Cm  Result Date: 05/24/2016 CLINICAL DATA:  Code stroke. Dizziness beginning last night, worsening this morning. EXAM: CT HEAD WITHOUT CONTRAST TECHNIQUE: Contiguous axial images were obtained from the base of the skull through the vertex without intravenous contrast. COMPARISON:  MRI 03/30/2016 FINDINGS: Brain: Mild generalized volume loss. Minimal small vessel changes of the hemispheric white matter, better seen at MRI. No sign of acute infarction, mass lesion, hemorrhage, hydrocephalus or extra-axial collection. Vascular: There is atherosclerotic calcification of the major vessels at the base of the brain. Skull: Normal Sinuses/Orbits: Clear/normal Other: None significant ASPECTS ([place] Stroke Program Early CT Score) -  Ganglionic level infarction (caudate, lentiform nuclei, internal capsule, insula, M1-M3 cortex): 7 - Supraganglionic infarction (M4-M6 cortex): 3 Total score (0-10 with 10 being normal): 10 IMPRESSION: 1. No acute finding. Mild chronic small-vessel ischemic change of the cerebral hemispheric white matter. 2. ASPECTS is 10 These results were called by telephone at the time of interpretation on 05/24/2016 at 12:37 pm to Dr. Dorie Rank , who verbally acknowledged these results. Electronically Signed   By: Nelson Chimes M.D.   On: 05/24/2016 12:38        Scheduled Meds: . aspirin  325 mg Oral Daily  .  atorvastatin  20 mg Oral q1800  . insulin aspart  0-9 Units Subcutaneous TID WC  . loratadine  10 mg Oral Daily  . phenazopyridine  200 mg Oral TID WC  . sodium chloride flush  3 mL Intravenous Q12H  . Telmisartan-Amlodipine  1 tablet Oral QHS   Continuous Infusions: . sodium chloride 100 mL/hr at 05/24/16 1907     LOS: 0 days     Cordelia Poche [facility] 05/25/2016, 3:21 PM Pager: [phone]  If 7PM-7AM, please contact night-coverage [email or web address] Password [email or web address] 05/25/2016, 3:21 PM

## 2016-05-26 DIAGNOSIS — E785 Hyperlipidemia, unspecified: Secondary | ICD-10-CM | POA: Diagnosis not present

## 2016-05-26 DIAGNOSIS — R42 Dizziness and giddiness: Secondary | ICD-10-CM | POA: Diagnosis not present

## 2016-05-26 DIAGNOSIS — I10 Essential (primary) hypertension: Secondary | ICD-10-CM | POA: Diagnosis not present

## 2016-05-26 DIAGNOSIS — C61 Malignant neoplasm of prostate: Secondary | ICD-10-CM | POA: Diagnosis not present

## 2016-05-26 LAB — GLUCOSE, CAPILLARY
GLUCOSE-CAPILLARY: 91 mg/dL (ref 65–99)
Glucose-Capillary: 93 mg/dL (ref 65–99)
Glucose-Capillary: 91 mg/dL (ref 65–99)

## 2016-05-26 MED ORDER — MECLIZINE HCL 25 MG PO TABS: 25.0000 mg | ORAL_TABLET | Freq: Three times a day (TID) | ORAL | 0 refills | Status: DC | PRN

## 2016-05-26 MED ORDER — ATORVASTATIN CALCIUM 20 MG PO TABS: 20.0000 mg | ORAL_TABLET | Freq: Every day | ORAL | 0 refills | Status: DC

## 2016-05-26 NOTE — Discharge Instructions (Signed)
[name], you were admitted because of your severe dizziness. It does not appear that you have had a stroke. This is likely related to benign paroxysmal positional vertigo. You received physical therapy maneuvers to help with this in the future. Please use meclizine as needed for symptoms.     Benign Positional Vertigo Vertigo is the feeling that you or your surroundings are moving when they are not. Benign positional vertigo is the most common form of vertigo. The cause of this condition is not serious (is benign). This condition is triggered by certain movements and positions (is positional). This condition can be dangerous if it occurs while you are doing something that could endanger you or others, such as driving.  CAUSES In many cases, the cause of this condition is not known. It may be caused by a disturbance in an area of the inner ear that helps your brain to sense movement and balance. This disturbance can be caused by a viral infection (labyrinthitis), head injury, or repetitive motion. RISK FACTORS This condition is more likely to develop in:  Women.  People who are 63 years of age or older. SYMPTOMS Symptoms of this condition usually happen when you move your head or your eyes in different directions. Symptoms may start suddenly, and they usually last for less than a minute. Symptoms may include:  Loss of balance and falling.  Feeling like you are spinning or moving.  Feeling like your surroundings are spinning or moving.  Nausea and vomiting.  Blurred vision.  Dizziness.  Involuntary eye movement (nystagmus). Symptoms can be mild and cause only slight annoyance, or they can be severe and interfere with daily life. Episodes of benign positional vertigo may return (recur) over time, and they may be triggered by certain movements. Symptoms may improve over time. DIAGNOSIS This condition is usually diagnosed by medical history and a physical exam of the head, neck, and  ears. You may be referred to a health care provider who specializes in ear, nose, and throat (ENT) problems (otolaryngologist) or a provider who specializes in disorders of the nervous system (neurologist). You may have additional testing, including:  MRI.  A CT scan.  Eye movement tests. Your health care provider may ask you to change positions quickly while he or she watches you for symptoms of benign positional vertigo, such as nystagmus. Eye movement may be tested with an electronystagmogram (ENG), caloric stimulation, the Dix-Hallpike test, or the roll test.  An electroencephalogram (EEG). This records electrical activity in your brain.  Hearing tests. TREATMENT Usually, your health care provider will treat this by moving your head in specific positions to adjust your inner ear back to normal. Surgery may be needed in severe cases, but this is rare. In some cases, benign positional vertigo may resolve on its own in 2-4 weeks. HOME CARE INSTRUCTIONS Safety  Move slowly.Avoid sudden body or head movements.  Avoid driving.  Avoid operating heavy machinery.  Avoid doing any tasks that would be dangerous to you or others if a vertigo episode would occur.  If you have trouble walking or keeping your balance, try using a cane for stability. If you feel dizzy or unstable, sit down right away.  Return to your normal activities as told by your health care provider. Ask your health care provider what activities are safe for you. General Instructions  Take over-the-counter and prescription medicines only as told by your health care provider.  Avoid certain positions or movements as told by your health care  provider.  Drink enough fluid to keep your urine clear or pale yellow.  Keep all follow-up visits as told by your health care provider. This is important. SEEK MEDICAL CARE IF:  You have a fever.  Your condition gets worse or you develop new symptoms.  Your family or friends  notice any behavioral changes.  Your nausea or vomiting gets worse.  You have numbness or a "pins and needles" sensation. SEEK IMMEDIATE MEDICAL CARE IF:  You have difficulty speaking or moving.  You are always dizzy.  You faint.  You develop severe headaches.  You have weakness in your legs or arms.  You have changes in your hearing or vision.  You develop a stiff neck.  You develop sensitivity to light.   This information is not intended to replace advice given to you by your health care provider. Make sure you discuss any questions you have with your health care provider.   Document Released: 05/12/2006 Document Revised: 04/25/2015 Document Reviewed: 11/27/2014 Elsevier Interactive Patient Education [employer].

## 2016-05-26 NOTE — Progress Notes (Signed)
OT Cancellation Note  Patient Details Name: [name] MRN: [id number] DOB: 11/29/1950   Cancelled Treatment:    Reason Eval/Treat Not Completed: OT screened, no needs identified, will sign off   Lucille Passy, OTR/L [id number]   Lucille Passy M 05/26/2016, 4:47 PM

## 2016-05-26 NOTE — Evaluation (Addendum)
Physical Therapy Evaluation Patient Details Name: [name] MRN: [id number] DOB: [birth year] Today's Date: 05/26/2016   History of Present Illness  65 y.o. male admitted to [facility] on 05/24/16 for acute onset vertigo and nausea.  Pt's MRI negative for acute event/infarct.  Pt with significant PMHHx of prostate CA (s/p surgery), bladder CA (s/p surgery), HTN, R eye vitreous tear (2010) s/p laser surgery, and renal vein thrombosis.  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 He was completely negative for BPPV testing of bil posterior and horizontal canals.  He has most difficulty with vertical head shaking both in seated and while walking with (+) staggering, stopping, or slowly of his gait while trying to preform vertical head shaking during gait.  Pt would benefit from f/u vestibular and balance therapy in the outpatient setting. PT discussed HEP x1 viewing exercises both vertical and horizontal as well as fall prevention and safety at home while he continues to have symptoms.   PT to follow acutely for deficits listed below.       Follow Up Recommendations Outpatient PT;Other (comment) (for vestibular/balance therapy)    Equipment Recommendations  None recommended by PT    Recommendations for Other Services   NA    Precautions / Restrictions Precautions Precautions: Fall Precaution Comments: mildly unsteady on his feet.        Mobility  Bed Mobility Overal bed mobility: Independent                Transfers Overall transfer level: Modified independent               General transfer comment: uses hands for transitions/stability  Ambulation/Gait Ambulation/Gait assistance: Supervision Ambulation Distance (Feet): 300 Feet Assistive device: None Gait Pattern/deviations: Step-through pattern;Staggering left;Staggering right Gait velocity: decreased Gait velocity interpretation: Below normal speed for  age/gender General Gait Details: Staggering mostly elicited by vertical head shaking.  Pt almost prefers to stop walking when made to shake head up and down.          Balance Overall balance assessment: Needs assistance Sitting-balance support: No upper extremity supported;Bilateral upper extremity supported Sitting balance-Leahy Scale: Good Sitting balance - Comments: when doing VOR testing, pt prefers to have his hands on the bed rails for stability.    Standing balance support: Single extremity supported;No upper extremity supported Standing balance-Leahy Scale: Good               High level balance activites: Other (comment) High Level Balance Comments: I had pt stand with eye open on level floor than eyes closed on level floor and the same eyes open on a pillow and eyes closed on a pillow without significant LOB.             05/26/16 1647  Vestibular Assessment  General Observation Pt reports that he had bladder CA tx where they inject the same germ as TB into his bladder and then he lays in the bed on his back for 30 mins, on his side 30 mins, on his stomach 30 mins, and on his other side 30 mins.  This was his first treatment and he started feelin mildly dizzy later that day with significant dizziness on Saturday that was unrelenting (hours) and he had severe N/V.  No changes in vision, no ringing in his ears, no changes in vision.  Pt has had R eye laser surgery.  He has h/o vertigo (once inner ear infection, once  on a boat).  No recent antibiotic use or known sinus or URI.  He does take a daily claritin for allergies.   Symptom Behavior  Type of Dizziness Spinning  Frequency of Dizziness constant for hours  Duration of Dizziness 1 day (better now, but still a sense of dysequilbrium)  Aggravating Factors Looking up to the ceiling  Relieving Factors Closing eyes (at its worse closing his eyes and lyning still)  Occulomotor Exam  Occulomotor Alignment Normal  Spontaneous  Absent  Gaze-induced Right beating nystagmus with R gaze  Smooth Pursuits Saccades  Saccades Poor trajectory  Vestibulo-Occular Reflex  VOR 1 Head Only (x 1 viewing) VOR x1 horizontal slow speed and mildly symptomatic, vertical (+) symptoms and significant decrease in speed needed.  Head thrust (+) right.   Auditory  Comments gross finger rub hearing equal bil  Positional Testing  Dix-Hallpike Dix-Hallpike Right;Dix-Hallpike Left  Horizontal Canal Testing Horizontal Canal Right;Horizontal Canal Left  Dix-Hallpike Right  Dix-Hallpike Right Duration 0  Dix-Hallpike Right Symptoms No nystagmus  Dix-Hallpike Left  Dix-Hallpike Left Duration 0  Dix-Hallpike Left Symptoms No nystagmus  Horizontal Canal Right  Horizontal Canal Right Duration 0  Horizontal Canal Right Symptoms Normal  Horizontal Canal Left  Horizontal Canal Left Duration 0  Horizontal Canal Left Symptoms Normal  Positional Sensitivities  Sit to Supine 0  Supine to Left Side 0  Supine to Right Side 0  Supine to Sitting 0  Right Hallpike 0  Up from Right Hallpike 0  Up from Left Hallpike 0  Head Turning x 5 2  Head Nodding x 5 3  Pivot Left in Standing 0  Rolling Right 0  Rolling Left 0        Pertinent Vitals/Pain Pain Assessment: No/denies pain    Home Living Family/patient expects to be discharged to:: Private residence Living Arrangements: Spouse/significant other Available Help at Discharge: Family;Available PRN/intermittently Type of Home: House       Home Layout: Two level Home Equipment: None      Prior Function Level of Independence: Independent                  Extremity/Trunk Assessment   Upper Extremity Assessment: Overall WFL for tasks assessed           Lower Extremity Assessment: Overall WFL for tasks assessed      Cervical / Trunk Assessment: Normal  Communication   Communication: No difficulties  Cognition Arousal/Alertness: Awake/alert Behavior During Therapy:  WFL for tasks assessed/performed Overall Cognitive Status: Within Functional Limits for tasks assessed                             Assessment/Plan    PT Assessment Patient needs continued PT services  PT Problem List Decreased balance;Decreased activity tolerance;Decreased mobility          PT Treatment Interventions DME instruction;Gait training;Functional mobility training;Stair training;Therapeutic activities;Therapeutic exercise;Balance training;Neuromuscular re-education;Patient/family education    PT Goals (Current goals can be found in the Care Plan section)  Acute Rehab PT Goals Patient Stated Goal: to go home, have this go away and never come back.  PT Goal Formulation: With patient/family Time For Goal Achievement: 06/09/16 Potential to Achieve Goals: Good    Frequency Min 4X/week           End of Session   Activity Tolerance: Patient tolerated treatment well Patient left: in bed;with call bell/phone within reach;with family/visitor present Nurse Communication: Mobility status  Functional Assessment Tool Used: assist level Functional Limitation: Mobility: Walking and moving around Mobility: Walking and Moving Around Current Status [phone]): At least 1 percent but less than 20 percent impaired, limited or restricted Mobility: Walking and Moving Around Goal Status [phone]): 0 percent impaired, limited or restricted    Time: 1341-1504 PT Time Calculation (min) (ACUTE ONLY): 83 min   Charges:   PT Evaluation $PT Eval Moderate Complexity: 1 Procedure PT Treatments $Gait Training: 8-22 mins $Therapeutic Activity: 23-37 mins $Self Care/Home Management: 23-37   PT G Codes:   PT G-Codes **NOT FOR INPATIENT CLASS** Functional Assessment Tool Used: assist level Functional Limitation: Mobility: Walking and moving around Mobility: Walking and Moving Around Current Status [phone]): At least 1 percent but less than 20 percent impaired, limited or  restricted Mobility: Walking and Moving Around Goal Status [phone]): 0 percent impaired, limited or restricted    Anihya Tuma B. Adamae Ricklefs, PT, DPT [phone]   05/26/2016, 4:27 PM

## 2016-05-26 NOTE — Discharge Summary (Signed)
Physician Discharge Summary  [name] [id number] DOB: 27-Nov-1950 DOA: 05/24/2016  PCP: Osborne Casco, MD  Admit date: 05/24/2016 Discharge date: 05/26/2016  Admitted From: Home Disposition:  Home  Recommendations for Outpatient Follow-up:  1. Follow up with PCP in 1 week 2. Outpatient vestibular rehab   Discharge Condition: Stable CODE STATUS: Full code   Brief/Interim Summary:  HPI written by Sharene Butters, PA and cosigned by Dr. Lottie Mussel on 05/24/2016  HPI: [name] is a 65 y.o. male with recent diagnosis of high grade urothelial carcinoma s/p TURP   not yet on therapy, presenting to the ED with acute onset of vertigo. He reported some dizziness last night before going to sleep, but theis morning his symptoms worsened. Ho was unable to move his head without feeling the room spinning. He also reports vomiting with minimal movement. He constantly feels he has to vomit. He is more comfortable lying on the left side, but the symptoms do not resolve completely. Denies any headaches. He denies any focal numbness or weakness. No double vision. No photophobia.  Recent issues with left ear pressure post op (cystoscopy and TURP ) on 8/17 but that had subsided since. Denies any vision changes, headaches. No dysarthria or dysphagia  No falls. Denies any confusion. No seizures reported. Denies any chest pain, palpitations, or shortness of breath. Denies any fever or chills, or night sweats.. Denies any abdominal pain, or diarrhea.He is not aware of any  any sick contacts or new foods or insect bites, bt he does fly quite frequently, last 2 days ago. Denies lower extremity swelling. Denies abnormal skin rashes, or neuropathy. Compliant with his medications. No history of stroke or family history of neurological disorders. No changes in medications . Never been seen by cardiologist. HAs not taken his BP meds today, takes them at night . He does take Claritin for allergies     Hospital course:  Vertigo Evaluated by PT. BPPV testing negative. Suggesting likely secondary to inflammation to the right inner ear. Improved with time and meclizine. MRI negative for acute infarct.  Hypertension Continued home telmisartan-amlodipine  Hyperlipidemia Continued atorvastatin  Elevated blood sugar Initially 170 on admission. Hemoglobin A1C of 5.1. Sliding scale insulin while admitted.  Bladder/Prostate cancer Outpatient management   Discharge Diagnoses:  Principal Problem:   Vertigo Active Problems:   Bladder cancer ([place])   Malignant neoplasm of prostate ([facility])   HTN (hypertension)   Hyperlipidemia    Discharge Instructions     Medication List    STOP taking these medications   HYDROcodone-acetaminophen 5-325 MG tablet Commonly known as:  NORCO   simvastatin 40 MG tablet Commonly known as:  ZOCOR     TAKE these medications   acetaminophen 500 MG tablet Commonly known as:  TYLENOL Take 500 mg by mouth every 6 (six) hours as needed for mild pain.   aspirin 325 MG tablet Take 325 mg by mouth daily.   atorvastatin 20 MG tablet Commonly known as:  LIPITOR Take 1 tablet (20 mg total) by mouth daily at 6 PM.   loratadine 10 MG tablet Commonly known as:  CLARITIN Take 10 mg by mouth daily.   meclizine 25 MG tablet Commonly known as:  ANTIVERT Take 1 tablet (25 mg total) by mouth 3 (three) times daily as needed for dizziness.   phenazopyridine 200 MG tablet Commonly known as:  PYRIDIUM Take 1 tablet (200 mg total) by mouth 3 (three) times daily as needed for pain.   TWYNSTA  80-5 MG Tabs Generic drug:  Telmisartan-Amlodipine Take 0.5 tablets by mouth every evening. Patient taking 1/2 tablet daily.       Allergies  Allergen Reactions  . Vibramycin [Doxycycline Calcium] Hives, Nausea Only and Other (See Comments)    fever  . Mushroom Extract Complex Hives and Nausea And Vomiting  . Succinylcholine Chloride Other (See Comments)     Postoperative myalgia    Consultations:  Physical therapy   Procedures/Studies: Mr Brain Wo Contrast  Result Date: 05/24/2016 CLINICAL DATA:  Initial evaluation for acute dizziness, vomiting. Question possible stroke. EXAM: MRI HEAD WITHOUT CONTRAST TECHNIQUE: Multiplanar, multiecho pulse sequences of the brain and surrounding structures were obtained without intravenous contrast. COMPARISON:  [facility] CT from earlier the same day as well as previous MRI from 03/30/2016. FINDINGS: Brain: Cerebral volume is stable. Mild scattered T2/FLAIR hyperintense foci involving the periventricular and deep white matter of both cerebral hemispheres again noted, also stable. Findings are nonspecific, but most like related to mild chronic microvascular ischemic disease. No abnormal foci of restricted diffusion to suggest acute or subacute ischemia. Gray-white matter differentiation maintained. No acute or chronic intracranial hemorrhage. No areas of chronic infarction. No mass lesion, midline shift, or mass effect. No hydrocephalus. No extra-axial fluid collection. Major dural sinuses are grossly patent. Vascular: Major intracranial vascular flow voids well maintained. Skull and upper cervical spine: Craniocervical junction within normal limits. Visualized upper cervical spine unremarkable. Bone marrow signal intensity within normal limits. No scalp soft tissue abnormality. Sinuses/Orbits: The globes and orbital soft tissues within normal limits. Paranasal sinuses are largely clear. No mastoid effusion. Inner ear structures within normal limits. Other: No other significant finding. IMPRESSION: 1. No acute intracranial infarct or other process identified. 2. Stable age-related cerebral atrophy with mild chronic microvascular ischemic disease. Electronically Signed   By: Jeannine Boga M.D.   On: 05/24/2016 17:13   Ct Head Code Stroke W/o Cm  Result Date: 05/24/2016 CLINICAL DATA:  Code stroke. Dizziness beginning  last night, worsening this morning. EXAM: CT HEAD WITHOUT CONTRAST TECHNIQUE: Contiguous axial images were obtained from the base of the skull through the vertex without intravenous contrast. COMPARISON:  MRI 03/30/2016 FINDINGS: Brain: Mild generalized volume loss. Minimal small vessel changes of the hemispheric white matter, better seen at MRI. No sign of acute infarction, mass lesion, hemorrhage, hydrocephalus or extra-axial collection. Vascular: There is atherosclerotic calcification of the major vessels at the base of the brain. Skull: Normal Sinuses/Orbits: Clear/normal Other: None significant ASPECTS ([facility] [facility] Stroke Program Early CT Score) - Ganglionic level infarction (caudate, lentiform nuclei, internal capsule, insula, M1-M3 cortex): 7 - Supraganglionic infarction (M4-M6 cortex): 3 Total score (0-10 with 10 being normal): 10 IMPRESSION: 1. No acute finding. Mild chronic small-vessel ischemic change of the cerebral hemispheric white matter. 2. ASPECTS is 10 These results were called by telephone at the time of interpretation on 05/24/2016 at 12:37 pm to Dr. Dorie Rank , who verbally acknowledged these results. Electronically Signed   By: Nelson Chimes M.D.   On: 05/24/2016 12:38     Subjective: Patient feels better today.  Discharge Exam: Vitals:   05/25/16 2118 05/26/16 0549  BP: 134/80 130/89  Pulse: 68 66  Resp: 18 17  Temp: 98.7 F (37.1 C) 98.5 F (36.9 C)   Vitals:   05/25/16 1548 05/25/16 1551 05/25/16 2118 05/26/16 0549  BP: 135/75 138/81 134/80 130/89  Pulse: 85 88 68 66  Resp:   18 17  Temp:   98.7 F (37.1 C) 98.5  F (36.9 C)  TempSrc:   Oral Oral  SpO2:  93% 96% 95%  Weight:    111.8 kg (246 lb 6.4 oz)  Height:        General exam: Appears calm and comfortable Respiratory system: Clear to auscultation. Respiratory effort normal. Cardiovascular system: S1 & S2 heard, RRR. No murmurs, rubs, gallops or clicks. Gastrointestinal system: Abdomen is nondistended, soft and  nontender. No organomegaly or masses felt. Normal bowel sounds heard. Central nervous system: Alert and oriented. No focal neurological deficits. No nystagmus. Reflexes 2+ and equal bilaterally in biceps, brachial, patellar, achilles. 5/5 strength of upper/lower extremity. No dysdiadochokinesia. Normal finger-to-nose.  Extremities: No edema. No calf tenderness Skin: No cyanosis. No rashes Psychiatry: Judgement and insight appear normal. Mood & affect appropriate.   The results of significant diagnostics from this hospitalization (including imaging, microbiology, ancillary and laboratory) are listed below for reference.     Microbiology: Recent Results (from the past 240 hour(s))  Culture, Urine     Status: None   Collection Time: 05/24/16  2:30 PM  Result Value Ref Range Status   Specimen Description URINE, CLEAN CATCH  Final   Special Requests NONE  Final   Culture NO GROWTH  Final   Report Status 05/25/2016 FINAL  Final     Labs: BNP (last 3 results) No results for input(s): BNP in the last 8760 hours. Basic Metabolic Panel:  Recent Labs Lab 05/24/16 1156 05/25/16 0541  NA 138 141  K 4.5 4.1  CL 109 110  CO2 21* 22  GLUCOSE 170* 83  BUN 20 15  CREATININE 1.41* 1.31*  CALCIUM 8.9 8.3*   Liver Function Tests:  Recent Labs Lab 05/24/16 1156 05/25/16 0541  AST 25 21  ALT 17 14*  ALKPHOS 77 60  BILITOT 0.6 0.9  PROT 6.1* 5.2*  ALBUMIN 3.5 2.7*   No results for input(s): LIPASE, AMYLASE in the last 168 hours. No results for input(s): AMMONIA in the last 168 hours. CBC:  Recent Labs Lab 05/24/16 1156 05/25/16 0541  WBC 13.6* 9.1  NEUTROABS 11.1*  --   HGB 14.1 12.3*  HCT 41.3 36.9*  MCV 98.3 100.0  PLT 188 162   Cardiac Enzymes: No results for input(s): CKTOTAL, CKMB, CKMBINDEX, TROPONINI in the last 168 hours. BNP: Invalid input(s): POCBNP CBG:  Recent Labs Lab 05/25/16 1150 05/25/16 1719 05/25/16 2123 05/26/16 0834 05/26/16 1237  GLUCAP 104*  101* 91 93 91   D-Dimer No results for input(s): DDIMER in the last 72 hours. Hgb A1c  Recent Labs  05/24/16 1655  HGBA1C 5.1   Lipid Profile No results for input(s): CHOL, HDL, LDLCALC, TRIG, CHOLHDL, LDLDIRECT in the last 72 hours. Thyroid function studies No results for input(s): TSH, T4TOTAL, T3FREE, THYROIDAB in the last 72 hours.  Invalid input(s): FREET3 Anemia work up No results for input(s): VITAMINB12, FOLATE, FERRITIN, TIBC, IRON, RETICCTPCT in the last 72 hours. Urinalysis    Component Value Date/Time   COLORURINE AMBER (A) 05/24/2016 1450   APPEARANCEUR HAZY (A) 05/24/2016 1450   LABSPEC >1.030 (H) 05/24/2016 1450   PHURINE 5.0 05/24/2016 1450   GLUCOSEU NEGATIVE 05/24/2016 1450   HGBUR MODERATE (A) 05/24/2016 1450   BILIRUBINUR NEGATIVE 05/24/2016 1450   KETONESUR NEGATIVE 05/24/2016 1450   PROTEINUR >300 (A) 05/24/2016 1450   UROBILINOGEN 1.0 05/16/2009 0839   NITRITE POSITIVE (A) 05/24/2016 1450   LEUKOCYTESUR SMALL (A) 05/24/2016 1450   Sepsis Labs Invalid input(s): PROCALCITONIN,  WBC,  LACTICIDVEN Microbiology  Recent Results (from the past 240 hour(s))  Culture, Urine     Status: None   Collection Time: 05/24/16  2:30 PM  Result Value Ref Range Status   Specimen Description URINE, CLEAN CATCH  Final   Special Requests NONE  Final   Culture NO GROWTH  Final   Report Status 05/25/2016 FINAL  Final     Time coordinating discharge: Over 30 minutes  SIGNED:   Cordelia Poche, MD [facility] 05/26/2016, 2:19 PM Pager [phone]  If 7PM-7AM, please contact night-coverage [email or web address] Password [email or web address]

## 2016-05-26 NOTE — Progress Notes (Signed)
[name] to be D/C'd to home per MD order.  Discussed with the patient and all questions fully answered.  VSS, Skin clean, dry and intact without evidence of skin break down, no evidence of skin tears noted. IV catheter discontinued intact. Site without signs and symptoms of complications. Dressing and pressure applied.  An After Visit Summary was printed and given to the patient. Patient received prescription.  D/c education completed with patient/family including follow up instructions, medication list, d/c activities limitations if indicated, with other d/c instructions as indicated by MD - patient able to verbalize understanding, all questions fully answered.   Patient instructed to return to ED, call 911, or call MD for any changes in condition.   Patient escorted via [place], and D/C home via private auto.  Morley Kos Price 05/26/2016 5:48 PM

## 2016-05-30 DIAGNOSIS — Z5111 Encounter for antineoplastic chemotherapy: Secondary | ICD-10-CM | POA: Diagnosis not present

## 2016-05-30 DIAGNOSIS — C672 Malignant neoplasm of lateral wall of bladder: Secondary | ICD-10-CM | POA: Diagnosis not present

## 2016-06-06 DIAGNOSIS — Z5111 Encounter for antineoplastic chemotherapy: Secondary | ICD-10-CM | POA: Diagnosis not present

## 2016-06-06 DIAGNOSIS — C678 Malignant neoplasm of overlapping sites of bladder: Secondary | ICD-10-CM | POA: Diagnosis not present

## 2016-06-13 DIAGNOSIS — C672 Malignant neoplasm of lateral wall of bladder: Secondary | ICD-10-CM | POA: Diagnosis not present

## 2016-06-13 DIAGNOSIS — Z5111 Encounter for antineoplastic chemotherapy: Secondary | ICD-10-CM | POA: Diagnosis not present

## 2016-06-16 DIAGNOSIS — L570 Actinic keratosis: Secondary | ICD-10-CM | POA: Diagnosis not present

## 2016-06-16 DIAGNOSIS — L821 Other seborrheic keratosis: Secondary | ICD-10-CM | POA: Diagnosis not present

## 2016-06-16 DIAGNOSIS — Z86008 Personal history of in-situ neoplasm of other site: Secondary | ICD-10-CM | POA: Diagnosis not present

## 2016-06-20 DIAGNOSIS — C672 Malignant neoplasm of lateral wall of bladder: Secondary | ICD-10-CM | POA: Diagnosis not present

## 2016-06-20 DIAGNOSIS — Z5111 Encounter for antineoplastic chemotherapy: Secondary | ICD-10-CM | POA: Diagnosis not present

## 2016-07-02 DIAGNOSIS — C61 Malignant neoplasm of prostate: Secondary | ICD-10-CM | POA: Diagnosis not present

## 2016-07-07 DIAGNOSIS — C61 Malignant neoplasm of prostate: Secondary | ICD-10-CM | POA: Diagnosis not present

## 2016-07-07 DIAGNOSIS — Z8551 Personal history of malignant neoplasm of bladder: Secondary | ICD-10-CM | POA: Diagnosis not present

## 2016-07-07 DIAGNOSIS — R319 Hematuria, unspecified: Secondary | ICD-10-CM | POA: Diagnosis not present

## 2016-07-07 DIAGNOSIS — I129 Hypertensive chronic kidney disease with stage 1 through stage 4 chronic kidney disease, or unspecified chronic kidney disease: Secondary | ICD-10-CM | POA: Diagnosis not present

## 2016-07-07 DIAGNOSIS — N022 Recurrent and persistent hematuria with diffuse membranous glomerulonephritis: Secondary | ICD-10-CM | POA: Diagnosis not present

## 2016-07-07 DIAGNOSIS — R972 Elevated prostate specific antigen [PSA]: Secondary | ICD-10-CM | POA: Diagnosis not present

## 2016-07-07 DIAGNOSIS — N181 Chronic kidney disease, stage 1: Secondary | ICD-10-CM | POA: Diagnosis not present

## 2016-07-17 DIAGNOSIS — Z23 Encounter for immunization: Secondary | ICD-10-CM | POA: Diagnosis not present

## 2016-08-20 DIAGNOSIS — N181 Chronic kidney disease, stage 1: Secondary | ICD-10-CM | POA: Diagnosis not present

## 2016-08-20 DIAGNOSIS — E785 Hyperlipidemia, unspecified: Secondary | ICD-10-CM | POA: Diagnosis not present

## 2016-09-29 DIAGNOSIS — R972 Elevated prostate specific antigen [PSA]: Secondary | ICD-10-CM | POA: Diagnosis not present

## 2016-10-06 DIAGNOSIS — C61 Malignant neoplasm of prostate: Secondary | ICD-10-CM | POA: Diagnosis not present

## 2016-10-06 DIAGNOSIS — R3121 Asymptomatic microscopic hematuria: Secondary | ICD-10-CM | POA: Diagnosis not present

## 2016-10-06 DIAGNOSIS — R972 Elevated prostate specific antigen [PSA]: Secondary | ICD-10-CM | POA: Diagnosis not present

## 2016-10-06 DIAGNOSIS — Z8551 Personal history of malignant neoplasm of bladder: Secondary | ICD-10-CM | POA: Diagnosis not present

## 2016-11-03 DIAGNOSIS — M109 Gout, unspecified: Secondary | ICD-10-CM | POA: Diagnosis not present

## 2016-11-03 DIAGNOSIS — E785 Hyperlipidemia, unspecified: Secondary | ICD-10-CM | POA: Diagnosis not present

## 2016-11-03 DIAGNOSIS — N181 Chronic kidney disease, stage 1: Secondary | ICD-10-CM | POA: Diagnosis not present

## 2016-11-10 DIAGNOSIS — N181 Chronic kidney disease, stage 1: Secondary | ICD-10-CM | POA: Diagnosis not present

## 2016-11-10 DIAGNOSIS — I129 Hypertensive chronic kidney disease with stage 1 through stage 4 chronic kidney disease, or unspecified chronic kidney disease: Secondary | ICD-10-CM | POA: Diagnosis not present

## 2016-11-10 DIAGNOSIS — M109 Gout, unspecified: Secondary | ICD-10-CM | POA: Diagnosis not present

## 2016-11-10 DIAGNOSIS — N022 Recurrent and persistent hematuria with diffuse membranous glomerulonephritis: Secondary | ICD-10-CM | POA: Diagnosis not present

## 2016-11-17 DIAGNOSIS — H2513 Age-related nuclear cataract, bilateral: Secondary | ICD-10-CM | POA: Diagnosis not present

## 2016-11-19 DIAGNOSIS — I129 Hypertensive chronic kidney disease with stage 1 through stage 4 chronic kidney disease, or unspecified chronic kidney disease: Secondary | ICD-10-CM | POA: Diagnosis not present

## 2016-12-18 DIAGNOSIS — I129 Hypertensive chronic kidney disease with stage 1 through stage 4 chronic kidney disease, or unspecified chronic kidney disease: Secondary | ICD-10-CM | POA: Diagnosis not present

## 2016-12-18 DIAGNOSIS — N181 Chronic kidney disease, stage 1: Secondary | ICD-10-CM | POA: Diagnosis not present

## 2017-02-02 DIAGNOSIS — Z8551 Personal history of malignant neoplasm of bladder: Secondary | ICD-10-CM | POA: Diagnosis not present

## 2017-02-09 DIAGNOSIS — R972 Elevated prostate specific antigen [PSA]: Secondary | ICD-10-CM | POA: Diagnosis not present

## 2017-02-09 DIAGNOSIS — C61 Malignant neoplasm of prostate: Secondary | ICD-10-CM | POA: Diagnosis not present

## 2017-02-09 DIAGNOSIS — Z8551 Personal history of malignant neoplasm of bladder: Secondary | ICD-10-CM | POA: Diagnosis not present

## 2017-02-27 DIAGNOSIS — Z5111 Encounter for antineoplastic chemotherapy: Secondary | ICD-10-CM | POA: Diagnosis not present

## 2017-02-27 DIAGNOSIS — C678 Malignant neoplasm of overlapping sites of bladder: Secondary | ICD-10-CM | POA: Diagnosis not present

## 2017-03-06 DIAGNOSIS — C672 Malignant neoplasm of lateral wall of bladder: Secondary | ICD-10-CM | POA: Diagnosis not present

## 2017-03-06 DIAGNOSIS — Z5111 Encounter for antineoplastic chemotherapy: Secondary | ICD-10-CM | POA: Diagnosis not present

## 2017-03-13 DIAGNOSIS — C672 Malignant neoplasm of lateral wall of bladder: Secondary | ICD-10-CM | POA: Diagnosis not present

## 2017-03-13 DIAGNOSIS — Z5111 Encounter for antineoplastic chemotherapy: Secondary | ICD-10-CM | POA: Diagnosis not present

## 2017-03-17 DIAGNOSIS — E785 Hyperlipidemia, unspecified: Secondary | ICD-10-CM | POA: Diagnosis not present

## 2017-03-17 DIAGNOSIS — N022 Recurrent and persistent hematuria with diffuse membranous glomerulonephritis: Secondary | ICD-10-CM | POA: Diagnosis not present

## 2017-03-17 DIAGNOSIS — R809 Proteinuria, unspecified: Secondary | ICD-10-CM | POA: Diagnosis not present

## 2017-03-17 DIAGNOSIS — N181 Chronic kidney disease, stage 1: Secondary | ICD-10-CM | POA: Diagnosis not present

## 2017-03-17 DIAGNOSIS — I129 Hypertensive chronic kidney disease with stage 1 through stage 4 chronic kidney disease, or unspecified chronic kidney disease: Secondary | ICD-10-CM | POA: Diagnosis not present

## 2017-03-17 DIAGNOSIS — N182 Chronic kidney disease, stage 2 (mild): Secondary | ICD-10-CM | POA: Diagnosis not present

## 2017-04-21 DIAGNOSIS — N182 Chronic kidney disease, stage 2 (mild): Secondary | ICD-10-CM | POA: Diagnosis not present

## 2017-05-25 DIAGNOSIS — Z8551 Personal history of malignant neoplasm of bladder: Secondary | ICD-10-CM | POA: Diagnosis not present

## 2017-05-25 DIAGNOSIS — C61 Malignant neoplasm of prostate: Secondary | ICD-10-CM | POA: Diagnosis not present

## 2017-05-25 DIAGNOSIS — R3121 Asymptomatic microscopic hematuria: Secondary | ICD-10-CM | POA: Diagnosis not present

## 2017-06-01 DIAGNOSIS — N182 Chronic kidney disease, stage 2 (mild): Secondary | ICD-10-CM | POA: Diagnosis not present

## 2017-06-15 DIAGNOSIS — Z85820 Personal history of malignant melanoma of skin: Secondary | ICD-10-CM | POA: Diagnosis not present

## 2017-06-15 DIAGNOSIS — Z23 Encounter for immunization: Secondary | ICD-10-CM | POA: Diagnosis not present

## 2017-06-15 DIAGNOSIS — L814 Other melanin hyperpigmentation: Secondary | ICD-10-CM | POA: Diagnosis not present

## 2017-06-15 DIAGNOSIS — D225 Melanocytic nevi of trunk: Secondary | ICD-10-CM | POA: Diagnosis not present

## 2017-06-15 DIAGNOSIS — L821 Other seborrheic keratosis: Secondary | ICD-10-CM | POA: Diagnosis not present

## 2017-06-22 DIAGNOSIS — N182 Chronic kidney disease, stage 2 (mild): Secondary | ICD-10-CM | POA: Diagnosis not present

## 2017-06-25 DIAGNOSIS — N182 Chronic kidney disease, stage 2 (mild): Secondary | ICD-10-CM | POA: Diagnosis not present

## 2017-06-29 ENCOUNTER — Other Ambulatory Visit: Payer: Self-pay | Admitting: Nephrology

## 2017-06-29 DIAGNOSIS — N179 Acute kidney failure, unspecified: Secondary | ICD-10-CM

## 2017-07-06 DIAGNOSIS — N022 Recurrent and persistent hematuria with diffuse membranous glomerulonephritis: Secondary | ICD-10-CM | POA: Diagnosis not present

## 2017-07-06 DIAGNOSIS — R809 Proteinuria, unspecified: Secondary | ICD-10-CM | POA: Diagnosis not present

## 2017-07-06 DIAGNOSIS — I129 Hypertensive chronic kidney disease with stage 1 through stage 4 chronic kidney disease, or unspecified chronic kidney disease: Secondary | ICD-10-CM | POA: Diagnosis not present

## 2017-07-06 DIAGNOSIS — M109 Gout, unspecified: Secondary | ICD-10-CM | POA: Diagnosis not present

## 2017-07-06 DIAGNOSIS — N183 Chronic kidney disease, stage 3 (moderate): Secondary | ICD-10-CM | POA: Diagnosis not present

## 2017-07-16 ENCOUNTER — Ambulatory Visit
Admission: RE | Admit: 2017-07-16 | Discharge: 2017-07-16 | Disposition: A | Discharge status: Home / Self Care | Payer: BLUE CROSS/BLUE SHIELD | Source: Ambulatory Visit | Attending: Nephrology | Admitting: Nephrology

## 2017-07-16 DIAGNOSIS — N179 Acute kidney failure, unspecified: Secondary | ICD-10-CM | POA: Diagnosis not present

## 2017-07-29 DIAGNOSIS — M25561 Pain in right knee: Secondary | ICD-10-CM | POA: Diagnosis not present

## 2017-07-30 ENCOUNTER — Ambulatory Visit (HOSPITAL_COMMUNITY)
Admission: RE | Admit: 2017-07-30 | Discharge: 2017-07-30 | Disposition: A | Discharge status: Home / Self Care | Payer: BLUE CROSS/BLUE SHIELD | Source: Ambulatory Visit | Attending: Sports Medicine | Admitting: Sports Medicine

## 2017-07-30 ENCOUNTER — Encounter: Payer: Self-pay | Admitting: Sports Medicine

## 2017-07-30 ENCOUNTER — Ambulatory Visit (INDEPENDENT_AMBULATORY_CARE_PROVIDER_SITE_OTHER): Payer: BLUE CROSS/BLUE SHIELD | Admitting: Sports Medicine

## 2017-07-30 VITALS — BP 132/75 | Ht 70.0 in | Wt 245.0 lb

## 2017-07-30 DIAGNOSIS — M79604 Pain in right leg: Secondary | ICD-10-CM | POA: Insufficient documentation

## 2017-07-30 NOTE — Progress Notes (Signed)
HPI  CC:   [name] is a 66 yo M with history of bladder and prostate cancer, PE and right knee injury who presents with right knee swelling.  He tripped and fell in his garage on 11/24, hitting the lateral side of his right knee on the ground. He also banged his right shoulder, which was initially heavily bruised but has improved. He reports feeling pain in his knee after the incident, however pain improved the next day or so and he was able to go about his daily activities with full range of motion.  However, he noticed the lateral aspect of his right knee swelled to the size of a softball about 15 minutes after the fall, and the swelling improved after an additional 15 minutes. He has a knot on his knee where the swelling occurred that has gone down in size.   He notes that since falling, his right ankle, foot, and lower right leg swell through out the day. The swelling improves overnight or when he elevates his leg, and then returns after waking up and 2 hours of activity. He had some blue/purple discoloration on the medial plantar portion of his right foot that comes and goes with the swelling.  He continues to be pain free, however he was worried that he might have a blood clot that is causing the swelling. He has a history of a pulmonary embolism and is currently on aspirin.   He denies numbness or tingling.  Previous Interventions Tried: elevating foot reduces swelling. Has not tried any medications, wraps, or ice   Past Injuries: right knee dislocation many years ago Past Surgeries: none in right knee Smoking: none PMH: bladder and prostate cancer, pulmonary embolism, kidney disease, right knee dislocation Meds: aspirin, Twynsta, simvastatin, furosemide, claritin, sodium bicarbonate, veltassa  ROS: Per HPI; in addition no fever, no rash, no additional weakness, no additional numbness, no additional paresthesias. Has had a fall/injury  Objective: BP 132/75   Ht 5\' 10"  (1.778 m)    Wt 245 lb (111.1 kg)   BMI 35.15 kg/m  Gen: well appearing, resting comfortably in chair, no acute distress CV: Well-perfused. Warm.  Resp: Non-labored.  Right lower extremity: hard, flat, nodule on lateral aspect of right knee, no erythema or warmth. No abrasions or bruising. +2 pitting edema in right lower leg goes until mid-calf. No calf, ankle or foot tenderness. Full range of motion and strength of right knee, ankle, and toes. +2 right dorsalis pedis pulse, right foot warm and well perfused. Gait: Nonpathologic posture, unremarkable stride without signs of limp or balance issues.  Assessment and Plan:  [name] is a 66 yo M with hx of pulmonary embolism, bladder and prostate cancer, and right knee injury who presents with swelling of his right knee and intermittent swelling of his right lower extremity. He has a hematoma on the lateral aspect of his right knee, confirmed via ultrasound. He may have also had a ruptured baker's cyst that is causing the swelling in his right lower extremity. Cyst unable to be visualized on ultrasound, however may be due to cyst rupturing. Unlikely a fracture given lack of pain and normal range of motion. He has a history of blood clots and is currently on aspirin; possible DVT however seems less likely given timing of swelling after his fall, the swelling comes and goes, and he has no calf tenderness. If Doppler is negative, no additional imaging or treatment is needed at this time and hematoma and swelling will likely  resolve over time. - performed knee U/S  - obtain Doppler to rule out DVT - ACE wrap to help with lower leg swelling - call patient with Doppler results and follow up as needed  Marney Doctor, MD [facility] PGY1  Patient seen and evaluated with the resident. I agree with the above plan of care. Patient's knee exam shows no effusion but he does have a noticeable hematoma along the lateral proximal tibia. This was confirmed with ultrasound. No  knee effusion was seen on ultrasound. No obvious Baker's cyst on ultrasound. He has no pain. Knee appears to be stable. Doppler was negative for DVT. The intermittent lower extremity swelling is unusual but I think we can take a watchful waiting approach at this point in time. Patient will elevate as much as possible and will use an Ace wrap for compression. I don't see the need for further imaging at this time but the patient will let me know if symptoms persist or worsen.

## 2017-07-30 NOTE — Progress Notes (Signed)
*  PRELIMINARY RESULTS* Vascular Ultrasound Right lower extremity venous duplex has been completed.  Preliminary findings: No evidence of deep vein thrombosis or baker's cysts in the right lower extremity.  Preliminary results called to Dr. Micheline Chapman @ 13:30.   Everrett Coombe 07/30/2017, 1:29 PM

## 2017-07-31 ENCOUNTER — Encounter: Payer: Self-pay | Admitting: Sports Medicine

## 2017-08-12 DIAGNOSIS — N183 Chronic kidney disease, stage 3 (moderate): Secondary | ICD-10-CM | POA: Diagnosis not present

## 2017-08-20 DIAGNOSIS — H00021 Hordeolum internum right upper eyelid: Secondary | ICD-10-CM | POA: Diagnosis not present

## 2017-08-24 DIAGNOSIS — C61 Malignant neoplasm of prostate: Secondary | ICD-10-CM | POA: Diagnosis not present

## 2017-08-31 DIAGNOSIS — C61 Malignant neoplasm of prostate: Secondary | ICD-10-CM | POA: Diagnosis not present

## 2017-08-31 DIAGNOSIS — Z8551 Personal history of malignant neoplasm of bladder: Secondary | ICD-10-CM | POA: Diagnosis not present

## 2017-08-31 DIAGNOSIS — R351 Nocturia: Secondary | ICD-10-CM | POA: Diagnosis not present

## 2017-08-31 DIAGNOSIS — R972 Elevated prostate specific antigen [PSA]: Secondary | ICD-10-CM | POA: Diagnosis not present

## 2017-09-16 IMAGING — CT CT HEAD CODE STROKE
3 series · 14 of 47 positions shown, 16 images · non-contrast
Comparison: MRI 03/30/2016

CLINICAL DATA: Code stroke. Dizziness beginning last night,
worsening this morning.

EXAM:
CT HEAD WITHOUT CONTRAST
TECHNIQUE: Contiguous axial images were obtained from the base of the skull
through the vertex without intravenous contrast.

[Series 2: head 5.0 h30s · axial · 0.49mm/px · z∈[-126,+24]mm · 8 of 36 slices shown, 10 images]
[im 3/36  brain]
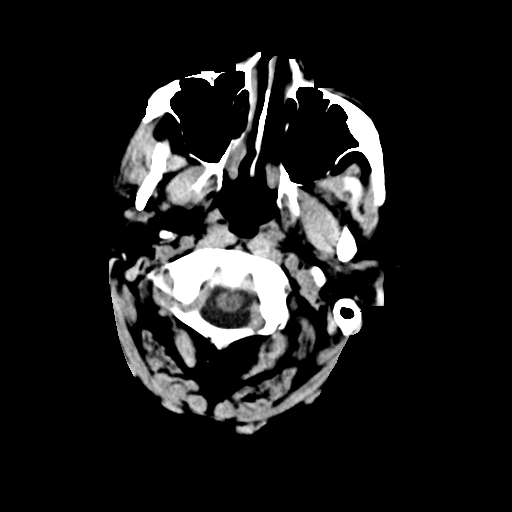
[im 3/36  bone]
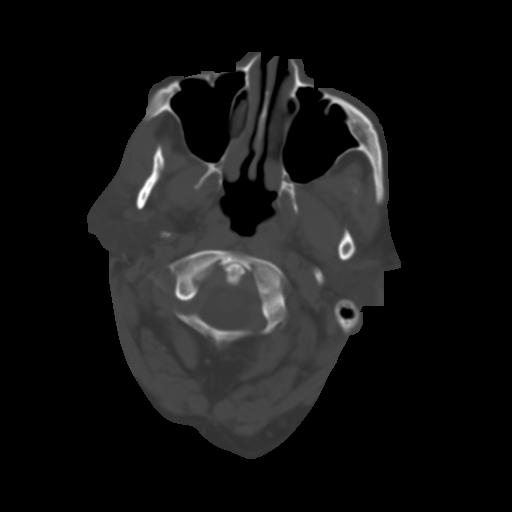
[im 8/36  brain]
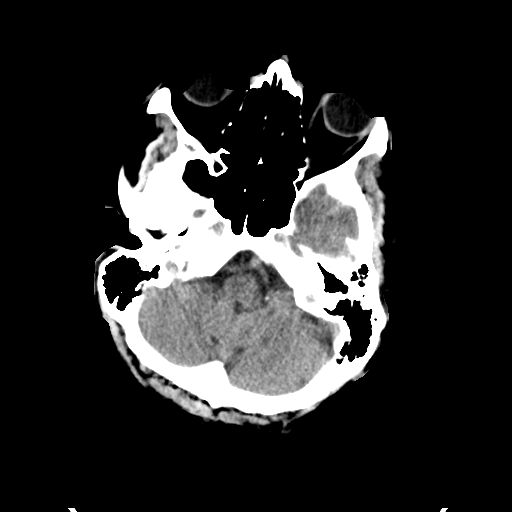
[im 11/36  brain]
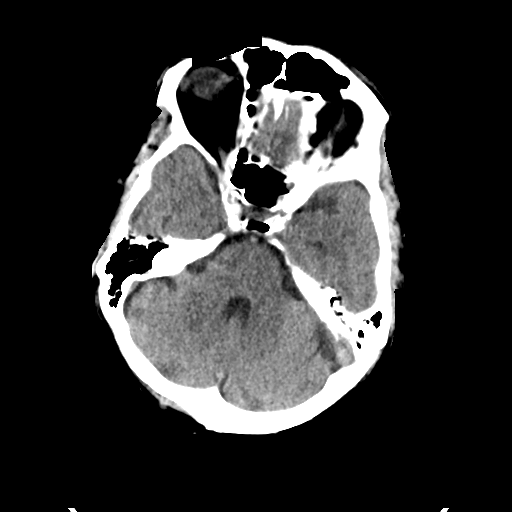
[im 16/36  brain]
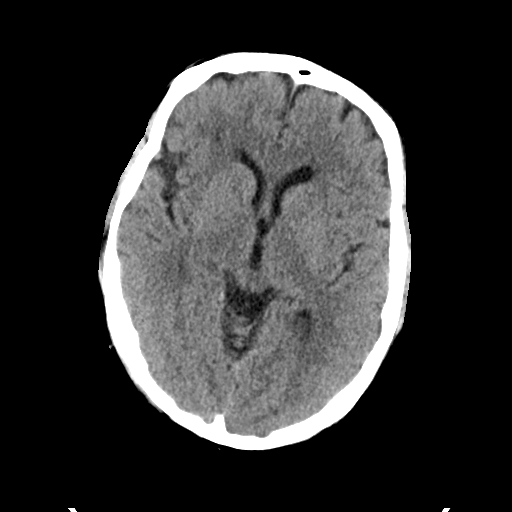
[im 20/36  brain]
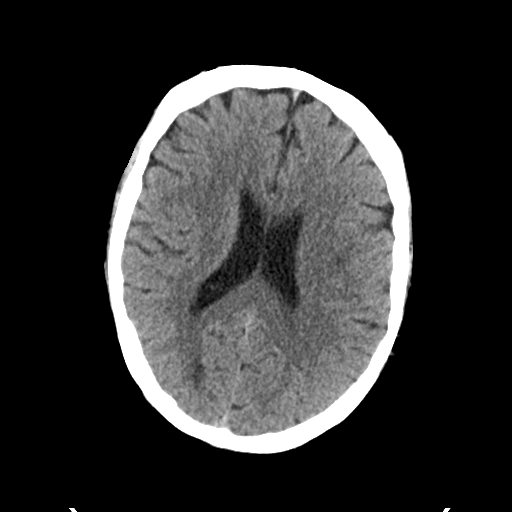
[im 20/36  bone]
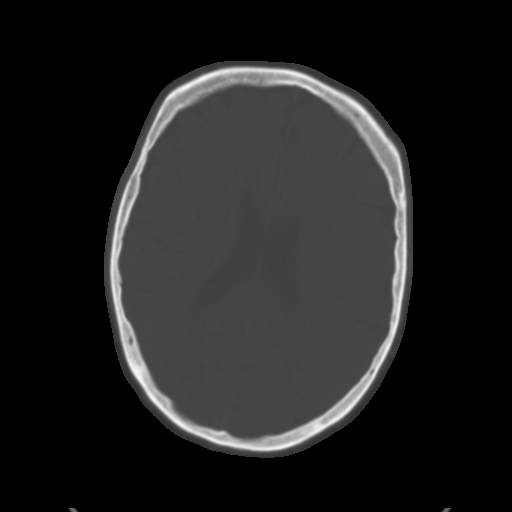
[im 25/36  brain]
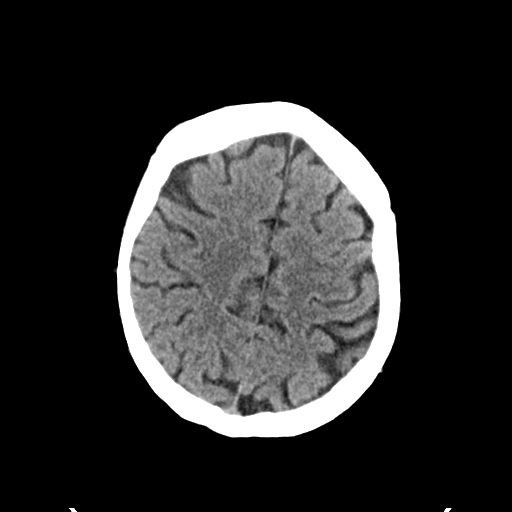
[im 28/36  brain]
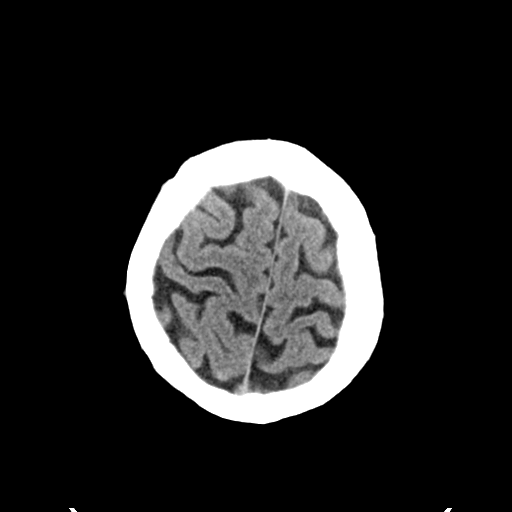
[im 33/36  brain]
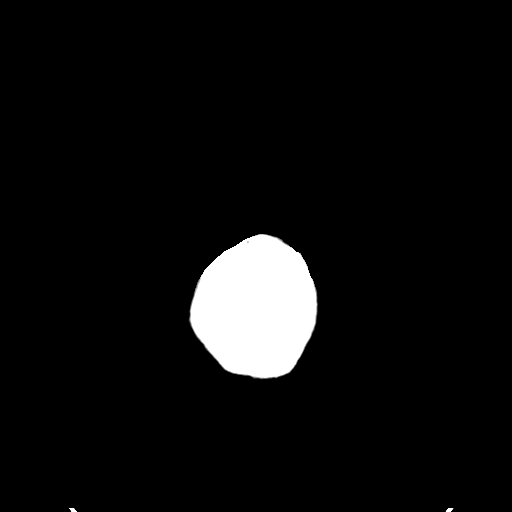

[Series 4: head 3.0 mpr · coronal · 0.35mm/px · 3 of 84 slices shown (1 of 2)]
[im 28/84  brain]
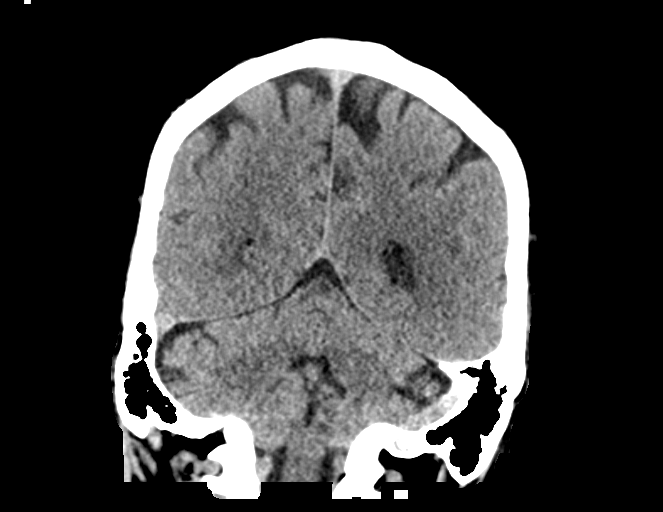
[im 37/84  brain]
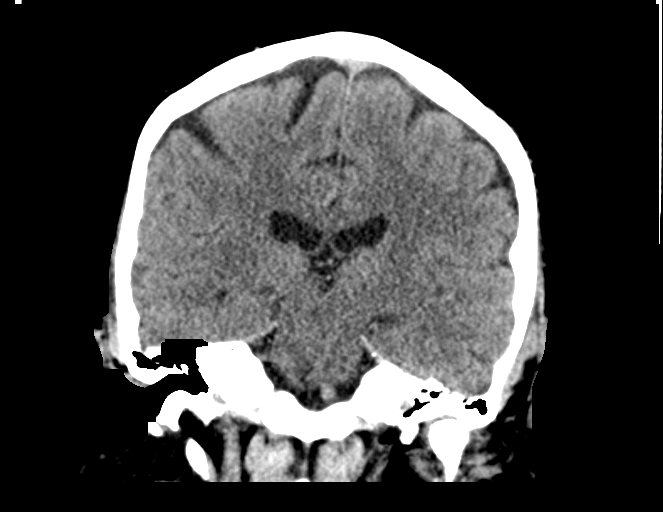
[im 47/84  brain]
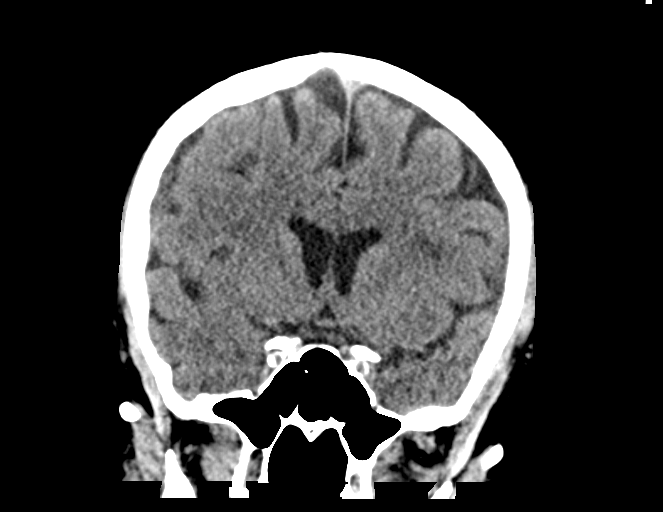

[Series 5: head 3.0 mpr · sagittal · 0.35mm/px · 3 of 66 slices shown (2 of 2)]
[im 22/66  brain]
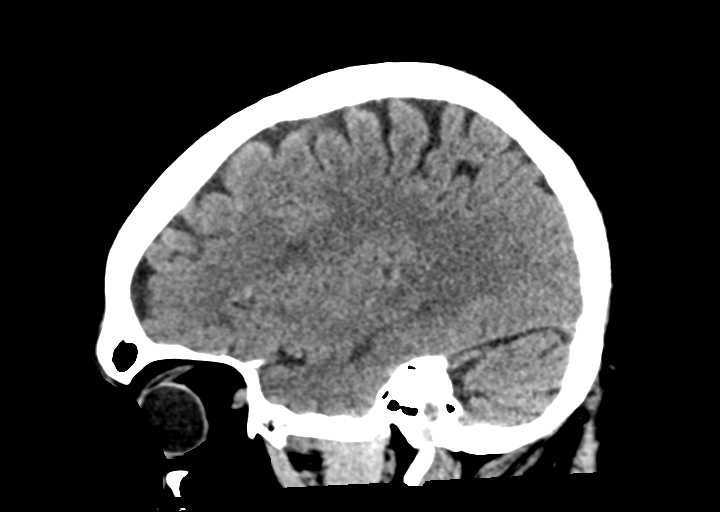
[im 33/66  brain]
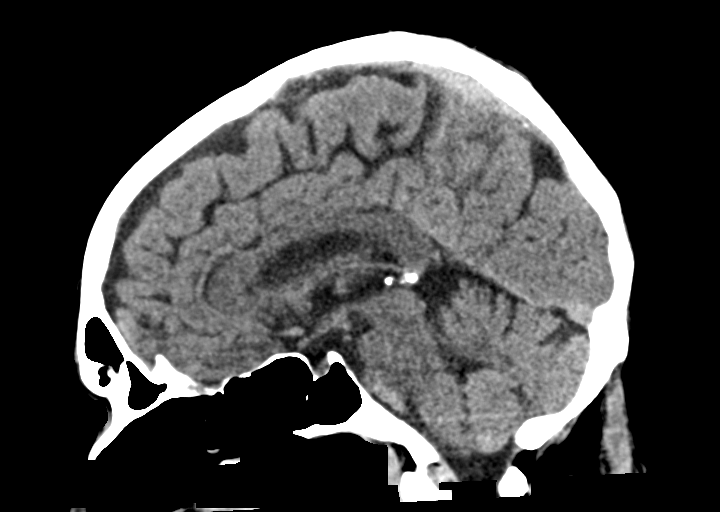
[im 44/66  brain]
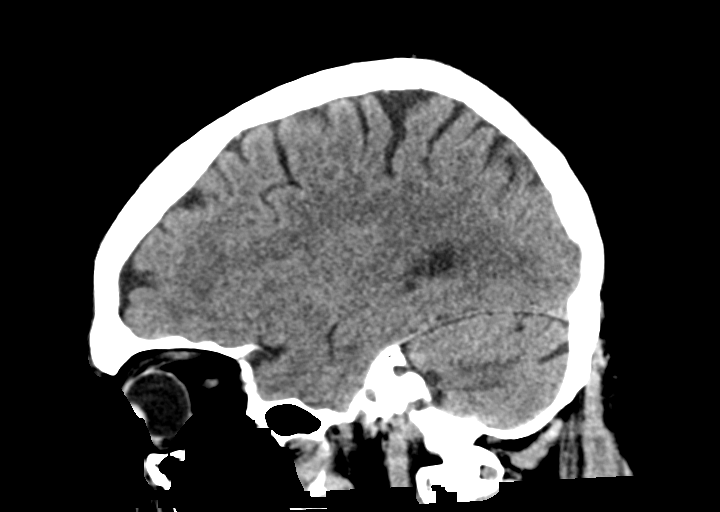

[14 of 47 positions shown; findings below may reference images not displayed]

FINDINGS: Brain: Mild generalized volume loss. Minimal small vessel changes of
the hemispheric white matter, better seen at MRI. No sign of acute
infarction, mass lesion, hemorrhage, hydrocephalus or extra-axial
collection.

Vascular: There is atherosclerotic calcification of the major
vessels at the base of the brain.

Skull: Normal

Sinuses/Orbits: Clear/normal

Other: None significant

ASPECTS ([place] Stroke Program Early CT Score)

- Ganglionic level infarction (caudate, lentiform nuclei, internal
capsule, insula, M1-M3 cortex): 7

- Supraganglionic infarction (M4-M6 cortex): 3

Total score (0-10 with 10 being normal): 10
IMPRESSION: 1. No acute finding. Mild chronic small-vessel ischemic change of
the cerebral hemispheric white matter.
2. ASPECTS is 10

These results were called by telephone at the time of interpretation
on 05/24/2016 at [DATE] to Dr. MIHRET LIEVANO , who verbally
acknowledged these results.

## 2017-09-16 IMAGING — MR MR HEAD W/O CM
9 of 10 series · 37 of 48 positions shown · non-contrast
Comparison: [facility] CT from earlier the same day as well as previous
MRI from 03/30/2016.

CLINICAL DATA: Initial evaluation for acute dizziness, vomiting.
Question possible stroke.

EXAM:
MRI HEAD WITHOUT CONTRAST
TECHNIQUE: Multiplanar, multiecho pulse sequences of the brain and surrounding
structures were obtained without intravenous contrast.

[Series 4: DWI · coronal · 5.0mm · 1.09mm/px · 8 of 86 slices shown (1 of 4)]
[im 1/86]
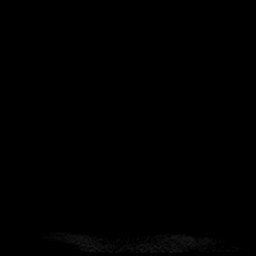
[im 13/86]
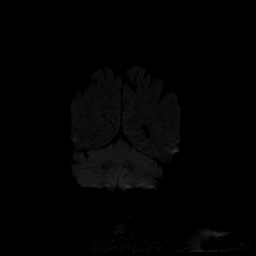
[im 25/86]
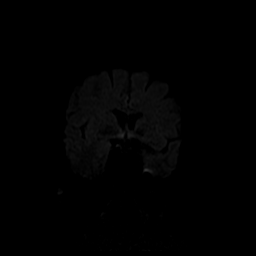
[im 37/86]
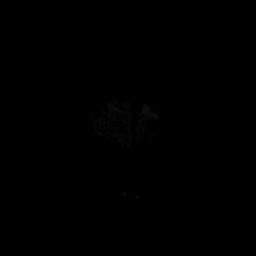
[im 49/86]
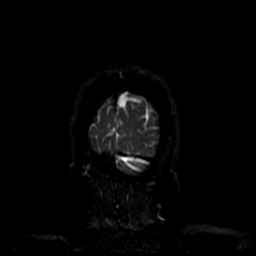
[im 61/86]
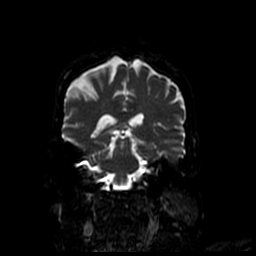
[im 73/86]
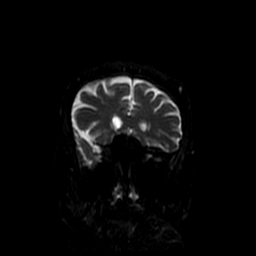
[im 86/86]
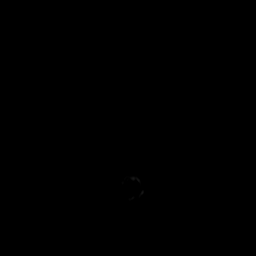

[Series 6: T1 · sagittal · 5.0mm · 0.47mm/px · 2 of 27 slices shown]
[im 1/27]
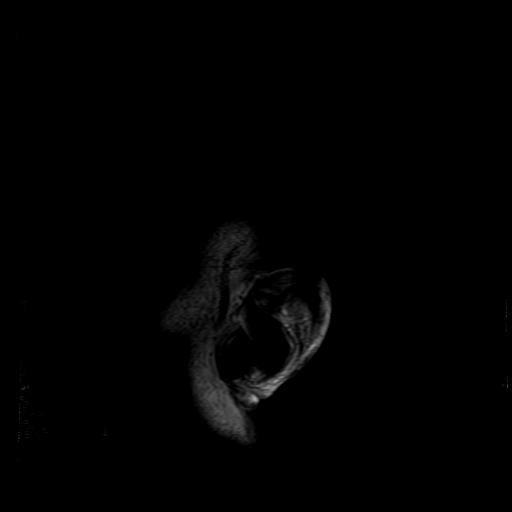
[im 27/27]
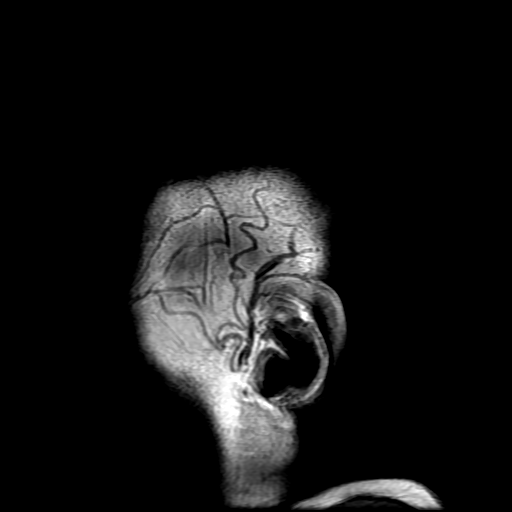

[Series 7: T2 · axial · 5.0mm · 0.45mm/px · z∈[-34,+128]mm · 2 of 28 slices shown]
[im 1/28]
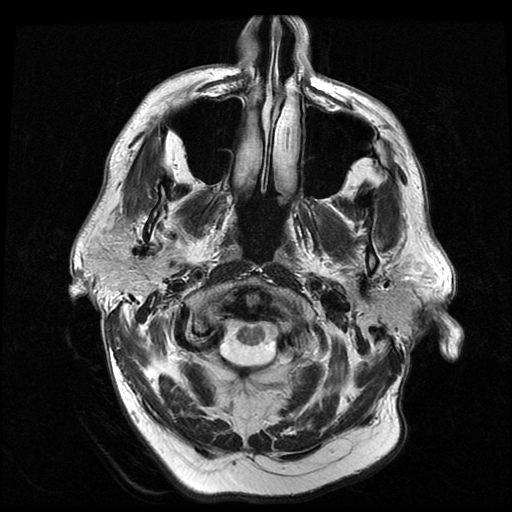
[im 28/28]
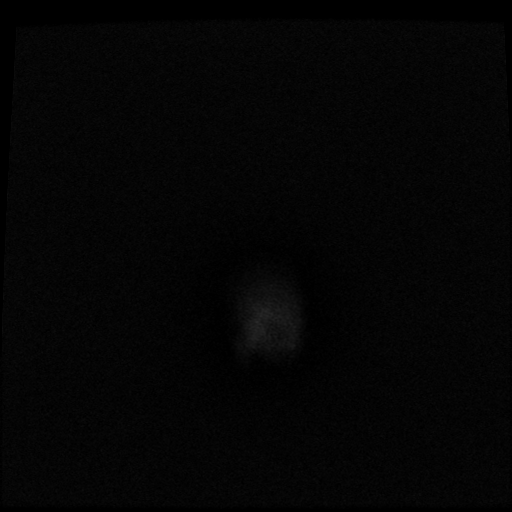

[Series 8: FLAIR · axial · 5.0mm · 0.45mm/px · z∈[-34,+128]mm · 2 of 28 slices shown]
[im 1/28]
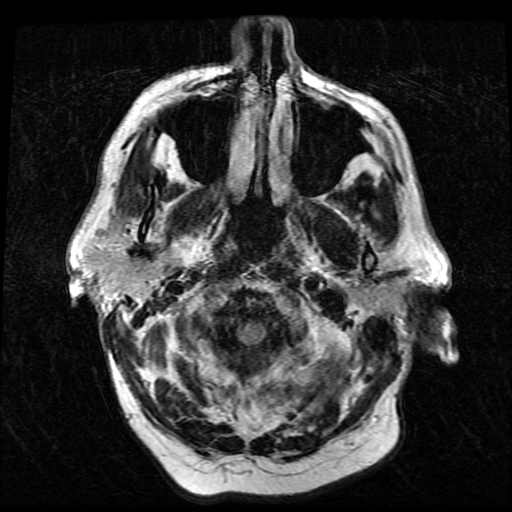
[im 28/28]
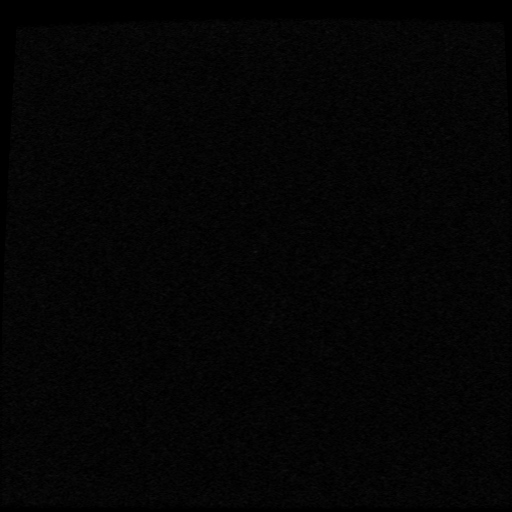

[Series 9: ax mpgr · axial · 5.0mm · 0.45mm/px · z∈[-34,+128]mm · 2 of 28 slices shown]
[im 1/28]
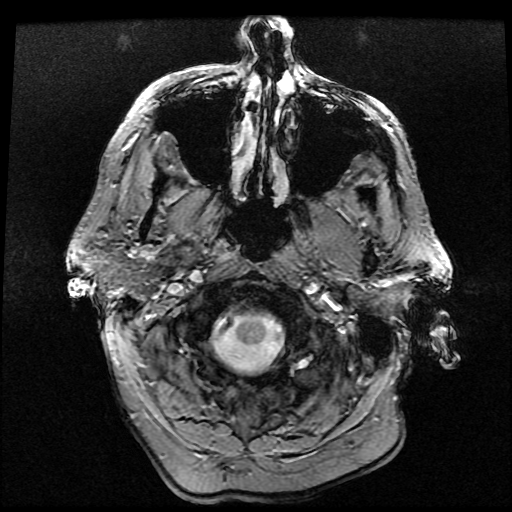
[im 28/28]
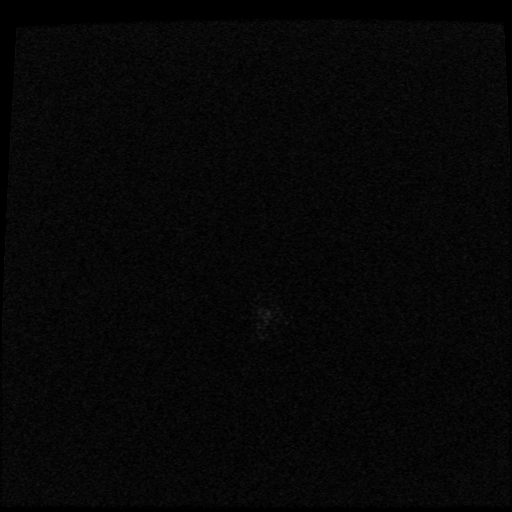

[Series 11: T2 post-contrast · coronal · 5.0mm · 0.39mm/px · 3 of 33 slices shown]
[im 1/33]
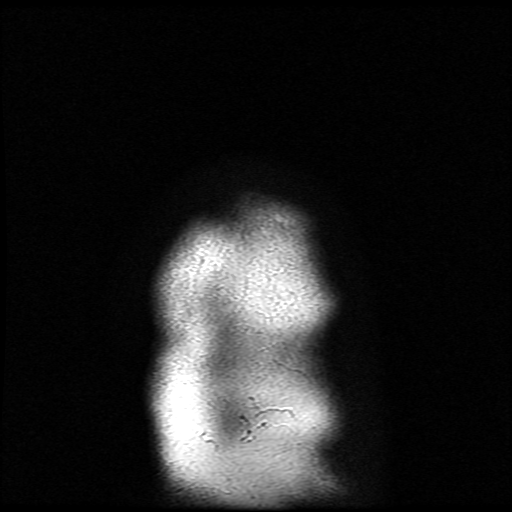
[im 17/33]
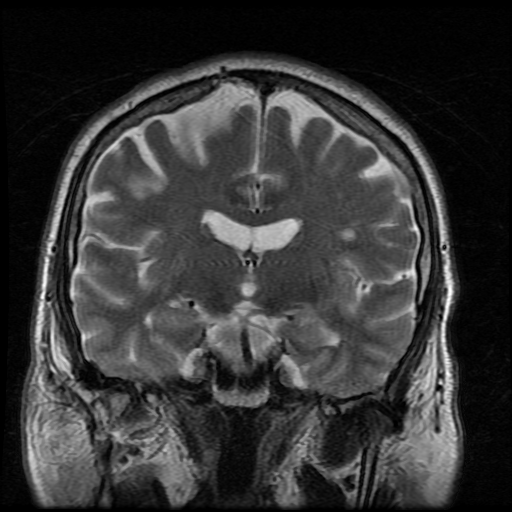
[im 33/33]
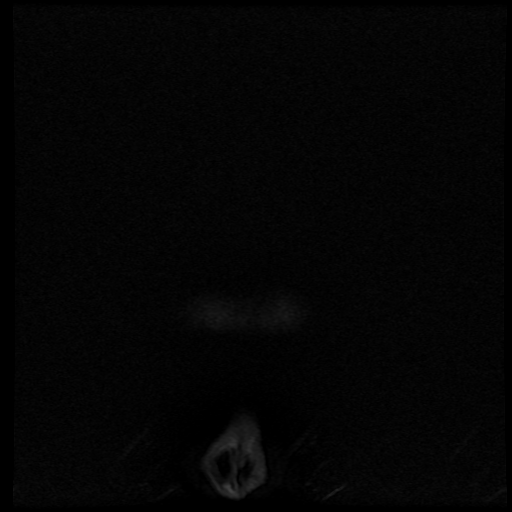

[Series 12: DWI · axial · 3.0mm · 1.09mm/px · z∈[-42,+123]mm · 9 of 112 slices shown (2 of 4)]
[im 1/112]
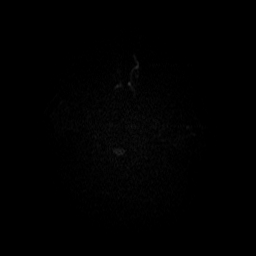
[im 13/112]
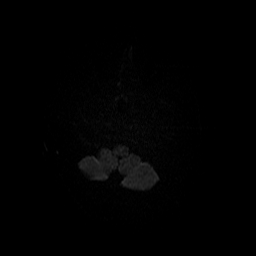
[im 25/112]
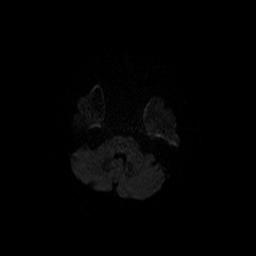
[im 38/112]
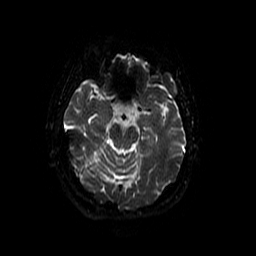
[im 50/112]
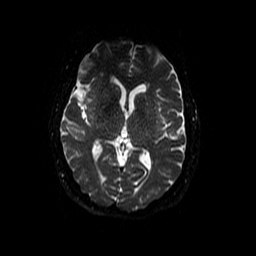
[im 62/112]
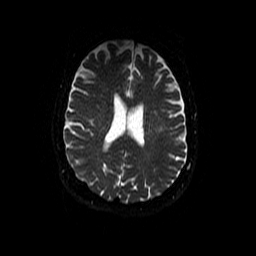
[im 75/112]
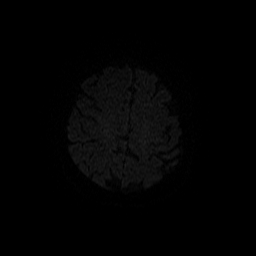
[im 99/112]
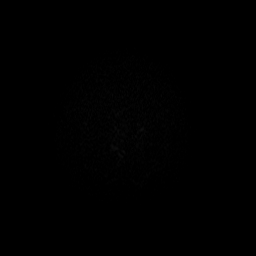
[im 112/112]
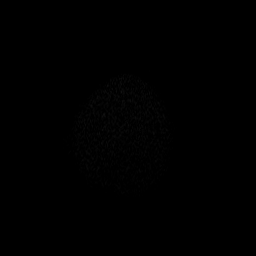

[Series 400: DWI · coronal · 5.0mm · 1.09mm/px · 4 of 43 slices shown (3 of 4)]
[im 1/43]
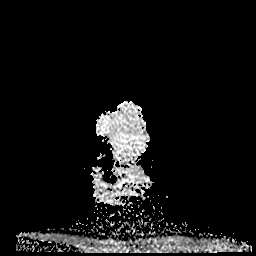
[im 15/43]
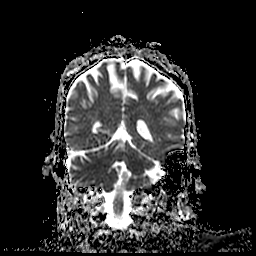
[im 29/43]
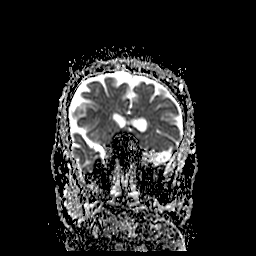
[im 43/43]
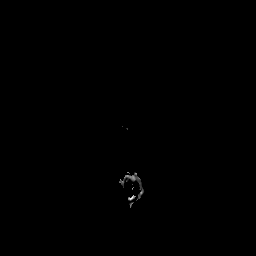

[Series 1200: DWI · axial · 3.0mm · 1.09mm/px · z∈[-42,+123]mm · 5 of 56 slices shown (4 of 4)]
[im 1/56]
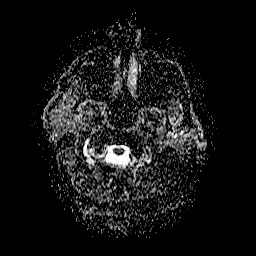
[im 14/56]
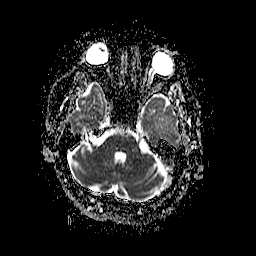
[im 28/56]
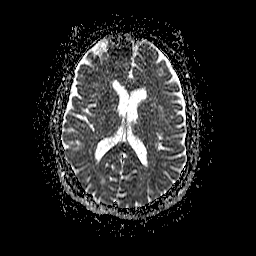
[im 42/56]
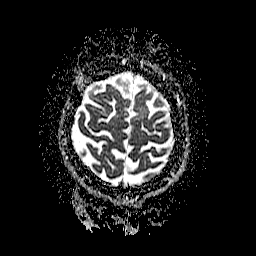
[im 56/56]
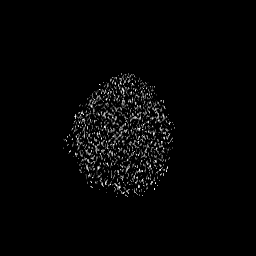

[37 of 48 positions shown; findings below may reference images not displayed]

FINDINGS: Brain: Cerebral volume is stable. Mild scattered T2/FLAIR
hyperintense foci involving the periventricular and deep white
matter of both cerebral hemispheres again noted, also stable.
Findings are nonspecific, but most like related to mild chronic
microvascular ischemic disease.

No abnormal foci of restricted diffusion to suggest acute or
subacute ischemia. Gray-white matter differentiation maintained. No
acute or chronic intracranial hemorrhage. No areas of chronic
infarction.

No mass lesion, midline shift, or mass effect. No hydrocephalus. No
extra-axial fluid collection. Major dural sinuses are grossly
patent.

Vascular: Major intracranial vascular flow voids well maintained.

Skull and upper cervical spine: Craniocervical junction within
normal limits. Visualized upper cervical spine unremarkable. Bone
marrow signal intensity within normal limits. No scalp soft tissue
abnormality.

Sinuses/Orbits: The globes and orbital soft tissues within normal
limits. Paranasal sinuses are largely clear. No mastoid effusion.
Inner ear structures within normal limits.

Other: No other significant finding.
IMPRESSION: 1. No acute intracranial infarct or other process identified.
2. Stable age-related cerebral atrophy with mild chronic
microvascular ischemic disease.

## 2017-09-21 DIAGNOSIS — C61 Malignant neoplasm of prostate: Secondary | ICD-10-CM | POA: Diagnosis not present

## 2017-09-30 DIAGNOSIS — E785 Hyperlipidemia, unspecified: Secondary | ICD-10-CM | POA: Diagnosis not present

## 2017-09-30 DIAGNOSIS — N183 Chronic kidney disease, stage 3 (moderate): Secondary | ICD-10-CM | POA: Diagnosis not present

## 2017-10-05 DIAGNOSIS — N022 Recurrent and persistent hematuria with diffuse membranous glomerulonephritis: Secondary | ICD-10-CM | POA: Diagnosis not present

## 2017-10-05 DIAGNOSIS — N183 Chronic kidney disease, stage 3 (moderate): Secondary | ICD-10-CM | POA: Diagnosis not present

## 2017-10-05 DIAGNOSIS — I129 Hypertensive chronic kidney disease with stage 1 through stage 4 chronic kidney disease, or unspecified chronic kidney disease: Secondary | ICD-10-CM | POA: Diagnosis not present

## 2017-10-05 DIAGNOSIS — E669 Obesity, unspecified: Secondary | ICD-10-CM | POA: Diagnosis not present

## 2017-10-05 DIAGNOSIS — R809 Proteinuria, unspecified: Secondary | ICD-10-CM | POA: Diagnosis not present

## 2017-10-05 DIAGNOSIS — E785 Hyperlipidemia, unspecified: Secondary | ICD-10-CM | POA: Diagnosis not present

## 2017-11-23 DIAGNOSIS — N5201 Erectile dysfunction due to arterial insufficiency: Secondary | ICD-10-CM | POA: Diagnosis not present

## 2017-11-23 DIAGNOSIS — C61 Malignant neoplasm of prostate: Secondary | ICD-10-CM | POA: Diagnosis not present

## 2017-11-23 DIAGNOSIS — Z8551 Personal history of malignant neoplasm of bladder: Secondary | ICD-10-CM | POA: Diagnosis not present

## 2017-11-24 DIAGNOSIS — L739 Follicular disorder, unspecified: Secondary | ICD-10-CM | POA: Diagnosis not present

## 2017-12-14 DIAGNOSIS — C678 Malignant neoplasm of overlapping sites of bladder: Secondary | ICD-10-CM | POA: Diagnosis not present

## 2017-12-14 DIAGNOSIS — Z5111 Encounter for antineoplastic chemotherapy: Secondary | ICD-10-CM | POA: Diagnosis not present

## 2017-12-21 DIAGNOSIS — Z5111 Encounter for antineoplastic chemotherapy: Secondary | ICD-10-CM | POA: Diagnosis not present

## 2017-12-21 DIAGNOSIS — C678 Malignant neoplasm of overlapping sites of bladder: Secondary | ICD-10-CM | POA: Diagnosis not present

## 2017-12-28 DIAGNOSIS — N390 Urinary tract infection, site not specified: Secondary | ICD-10-CM | POA: Diagnosis not present

## 2017-12-28 DIAGNOSIS — N183 Chronic kidney disease, stage 3 (moderate): Secondary | ICD-10-CM | POA: Diagnosis not present

## 2017-12-29 DIAGNOSIS — R8271 Bacteriuria: Secondary | ICD-10-CM | POA: Diagnosis not present

## 2017-12-29 DIAGNOSIS — N390 Urinary tract infection, site not specified: Secondary | ICD-10-CM | POA: Diagnosis not present

## 2018-01-04 DIAGNOSIS — N183 Chronic kidney disease, stage 3 (moderate): Secondary | ICD-10-CM | POA: Diagnosis not present

## 2018-01-04 DIAGNOSIS — N022 Recurrent and persistent hematuria with diffuse membranous glomerulonephritis: Secondary | ICD-10-CM | POA: Diagnosis not present

## 2018-01-04 DIAGNOSIS — I129 Hypertensive chronic kidney disease with stage 1 through stage 4 chronic kidney disease, or unspecified chronic kidney disease: Secondary | ICD-10-CM | POA: Diagnosis not present

## 2018-01-04 DIAGNOSIS — R809 Proteinuria, unspecified: Secondary | ICD-10-CM | POA: Diagnosis not present

## 2018-01-06 DIAGNOSIS — C672 Malignant neoplasm of lateral wall of bladder: Secondary | ICD-10-CM | POA: Diagnosis not present

## 2018-01-06 DIAGNOSIS — Z5111 Encounter for antineoplastic chemotherapy: Secondary | ICD-10-CM | POA: Diagnosis not present

## 2018-01-29 DIAGNOSIS — N183 Chronic kidney disease, stage 3 (moderate): Secondary | ICD-10-CM | POA: Diagnosis not present

## 2018-03-15 DIAGNOSIS — Z8551 Personal history of malignant neoplasm of bladder: Secondary | ICD-10-CM | POA: Diagnosis not present

## 2018-03-15 DIAGNOSIS — C61 Malignant neoplasm of prostate: Secondary | ICD-10-CM | POA: Diagnosis not present

## 2018-03-15 DIAGNOSIS — R3121 Asymptomatic microscopic hematuria: Secondary | ICD-10-CM | POA: Diagnosis not present

## 2018-03-15 DIAGNOSIS — N5201 Erectile dysfunction due to arterial insufficiency: Secondary | ICD-10-CM | POA: Diagnosis not present

## 2018-03-19 ENCOUNTER — Encounter: Payer: BLUE CROSS/BLUE SHIELD | Attending: Nephrology | Admitting: Registered"

## 2018-03-19 DIAGNOSIS — M109 Gout, unspecified: Secondary | ICD-10-CM | POA: Insufficient documentation

## 2018-03-19 DIAGNOSIS — I129 Hypertensive chronic kidney disease with stage 1 through stage 4 chronic kidney disease, or unspecified chronic kidney disease: Secondary | ICD-10-CM | POA: Insufficient documentation

## 2018-03-19 DIAGNOSIS — N183 Chronic kidney disease, stage 3 unspecified: Secondary | ICD-10-CM

## 2018-03-19 DIAGNOSIS — E669 Obesity, unspecified: Secondary | ICD-10-CM | POA: Diagnosis not present

## 2018-03-19 DIAGNOSIS — Z713 Dietary counseling and surveillance: Secondary | ICD-10-CM | POA: Insufficient documentation

## 2018-03-19 NOTE — Progress Notes (Signed)
Medical Nutrition Therapy:  Appt start time: 0815 end time:  0845.  This patient is accompanied in the office by his spouse.  Assessment:  Primary concerns today: Patient and spouse express frustration in trying to create meals that are low in sodium, potassium, protein, cholesterol. Patient's wife also states that she has tried cutting carbs to help with weight loss. RD has not seen any reliable research showing effective long-term weight-loss strategies and did not recommend drastically cutting calories, which on top of having to restrict so many nutrients only adds to the stress.   Pt reports that they rarely eat out but starting next week he will be back on his work schedule that requires being out of town 3 days/week and is concerned about how to eat. Pt states he has become very comfortable with the low sodium diet and now prefers food with less salt. Pt states he doesn't eat a lot of variety of fruit, but does eat a lot of berries.  RD was not able to print out handouts due to network issues during visit, instead emailed patient PDF of handouts and links.   Preferred Learning Style:   Auditory  Visual  Hands on  Learning Readiness:   Change in progress   MEDICATIONS: reviewed   DIETARY INTAKE:  Everyday foods include coffee.  Avoided foods include food high in sodium, potassium.    24-hr recall:  B ( AM): blk coffee, grits or wheat chex or frosted mini wheat OR omelette on weekend Snk ( AM): cheese stick  L (1 PM): apple OR chicken salad sandwich Snk ( PM): none D (6:30-8 PM): meat, green beans, summer squate Snk ( PM): none Beverages: water, coffee  Usual physical activity: not assessed  Estimated energy needs: 2000 calories  Progress Towards Goal(s):  In progress.   Nutritional Diagnosis:  NB-1.1 Food and nutrition-related knowledge deficit As related to foods to limit for potassium.  As evidenced by pt stated confusion on following conflicting food lists.     Intervention:  Nutrition Education. Discussed resources for nutrient limitations due to kidney disease.  Plan: Continue preparing most of your meals at home.  Use mindful eating to help eat portion sizes that are appropriate for your body Use the Dining Out guide to help you navigate restaurant menus and tips for order condiments, sauces on the side and asking for preparation of meals to include less salt.  Teaching Method Utilized:  Visual Auditory  Handouts given via email post-visit include:  AND list of high, medium, low potassium foods  Dining out guide for CKD ([email or web address].org)  Barriers to learning/adherence to lifestyle change: none  Demonstrated degree of understanding via:  Teach Back   Monitoring/Evaluation:  Dietary intake, exercise, and body weight prn.

## 2018-03-22 ENCOUNTER — Encounter: Payer: Self-pay | Admitting: Registered"

## 2018-03-22 DIAGNOSIS — N183 Chronic kidney disease, stage 3 unspecified: Secondary | ICD-10-CM | POA: Insufficient documentation

## 2018-03-22 NOTE — Patient Instructions (Signed)
Plan: Continue preparing most of your meals at home.  Use mindful eating to help eat portion sizes that are appropriate for your body Use the Dining Out guide to help you navigate restaurant menus and tips for order condiments, sauces on the side and asking for preparation of meals to include less salt.

## 2018-04-05 DIAGNOSIS — E785 Hyperlipidemia, unspecified: Secondary | ICD-10-CM | POA: Diagnosis not present

## 2018-04-05 DIAGNOSIS — N183 Chronic kidney disease, stage 3 (moderate): Secondary | ICD-10-CM | POA: Diagnosis not present

## 2018-04-12 DIAGNOSIS — R809 Proteinuria, unspecified: Secondary | ICD-10-CM | POA: Diagnosis not present

## 2018-04-12 DIAGNOSIS — I129 Hypertensive chronic kidney disease with stage 1 through stage 4 chronic kidney disease, or unspecified chronic kidney disease: Secondary | ICD-10-CM | POA: Diagnosis not present

## 2018-04-12 DIAGNOSIS — N022 Recurrent and persistent hematuria with diffuse membranous glomerulonephritis: Secondary | ICD-10-CM | POA: Diagnosis not present

## 2018-04-12 DIAGNOSIS — N183 Chronic kidney disease, stage 3 (moderate): Secondary | ICD-10-CM | POA: Diagnosis not present

## 2018-04-20 DIAGNOSIS — M722 Plantar fascial fibromatosis: Secondary | ICD-10-CM | POA: Diagnosis not present

## 2018-04-20 DIAGNOSIS — M7732 Calcaneal spur, left foot: Secondary | ICD-10-CM | POA: Diagnosis not present

## 2018-04-26 DIAGNOSIS — M71572 Other bursitis, not elsewhere classified, left ankle and foot: Secondary | ICD-10-CM | POA: Diagnosis not present

## 2018-04-26 DIAGNOSIS — M722 Plantar fascial fibromatosis: Secondary | ICD-10-CM | POA: Diagnosis not present

## 2018-06-03 DIAGNOSIS — Z23 Encounter for immunization: Secondary | ICD-10-CM | POA: Diagnosis not present

## 2018-06-14 DIAGNOSIS — C61 Malignant neoplasm of prostate: Secondary | ICD-10-CM | POA: Diagnosis not present

## 2018-06-14 DIAGNOSIS — R3121 Asymptomatic microscopic hematuria: Secondary | ICD-10-CM | POA: Diagnosis not present

## 2018-06-14 DIAGNOSIS — Z8551 Personal history of malignant neoplasm of bladder: Secondary | ICD-10-CM | POA: Diagnosis not present

## 2018-06-14 DIAGNOSIS — N5201 Erectile dysfunction due to arterial insufficiency: Secondary | ICD-10-CM | POA: Diagnosis not present

## 2018-06-16 DIAGNOSIS — L814 Other melanin hyperpigmentation: Secondary | ICD-10-CM | POA: Diagnosis not present

## 2018-06-16 DIAGNOSIS — Z85820 Personal history of malignant melanoma of skin: Secondary | ICD-10-CM | POA: Diagnosis not present

## 2018-06-16 DIAGNOSIS — D225 Melanocytic nevi of trunk: Secondary | ICD-10-CM | POA: Diagnosis not present

## 2018-06-16 DIAGNOSIS — L821 Other seborrheic keratosis: Secondary | ICD-10-CM | POA: Diagnosis not present

## 2018-06-28 DIAGNOSIS — C670 Malignant neoplasm of trigone of bladder: Secondary | ICD-10-CM | POA: Diagnosis not present

## 2018-07-05 DIAGNOSIS — R8271 Bacteriuria: Secondary | ICD-10-CM | POA: Diagnosis not present

## 2018-07-07 DIAGNOSIS — E785 Hyperlipidemia, unspecified: Secondary | ICD-10-CM | POA: Diagnosis not present

## 2018-07-07 DIAGNOSIS — N183 Chronic kidney disease, stage 3 (moderate): Secondary | ICD-10-CM | POA: Diagnosis not present

## 2018-07-12 DIAGNOSIS — R809 Proteinuria, unspecified: Secondary | ICD-10-CM | POA: Diagnosis not present

## 2018-07-12 DIAGNOSIS — I129 Hypertensive chronic kidney disease with stage 1 through stage 4 chronic kidney disease, or unspecified chronic kidney disease: Secondary | ICD-10-CM | POA: Diagnosis not present

## 2018-07-12 DIAGNOSIS — Z5111 Encounter for antineoplastic chemotherapy: Secondary | ICD-10-CM | POA: Diagnosis not present

## 2018-07-12 DIAGNOSIS — N022 Recurrent and persistent hematuria with diffuse membranous glomerulonephritis: Secondary | ICD-10-CM | POA: Diagnosis not present

## 2018-07-12 DIAGNOSIS — C670 Malignant neoplasm of trigone of bladder: Secondary | ICD-10-CM | POA: Diagnosis not present

## 2018-07-12 DIAGNOSIS — N183 Chronic kidney disease, stage 3 (moderate): Secondary | ICD-10-CM | POA: Diagnosis not present

## 2018-07-19 DIAGNOSIS — C678 Malignant neoplasm of overlapping sites of bladder: Secondary | ICD-10-CM | POA: Diagnosis not present

## 2018-07-26 DIAGNOSIS — C678 Malignant neoplasm of overlapping sites of bladder: Secondary | ICD-10-CM | POA: Diagnosis not present

## 2018-07-26 DIAGNOSIS — Z5111 Encounter for antineoplastic chemotherapy: Secondary | ICD-10-CM | POA: Diagnosis not present

## 2018-08-16 DIAGNOSIS — N183 Chronic kidney disease, stage 3 (moderate): Secondary | ICD-10-CM | POA: Diagnosis not present

## 2018-09-21 DIAGNOSIS — N183 Chronic kidney disease, stage 3 (moderate): Secondary | ICD-10-CM | POA: Diagnosis not present

## 2018-09-21 DIAGNOSIS — E785 Hyperlipidemia, unspecified: Secondary | ICD-10-CM | POA: Diagnosis not present

## 2018-09-24 DIAGNOSIS — C678 Malignant neoplasm of overlapping sites of bladder: Secondary | ICD-10-CM | POA: Diagnosis not present

## 2018-09-24 DIAGNOSIS — C61 Malignant neoplasm of prostate: Secondary | ICD-10-CM | POA: Diagnosis not present

## 2018-09-27 DIAGNOSIS — N022 Recurrent and persistent hematuria with diffuse membranous glomerulonephritis: Secondary | ICD-10-CM | POA: Diagnosis not present

## 2018-09-27 DIAGNOSIS — R809 Proteinuria, unspecified: Secondary | ICD-10-CM | POA: Diagnosis not present

## 2018-09-27 DIAGNOSIS — N183 Chronic kidney disease, stage 3 (moderate): Secondary | ICD-10-CM | POA: Diagnosis not present

## 2018-09-27 DIAGNOSIS — I129 Hypertensive chronic kidney disease with stage 1 through stage 4 chronic kidney disease, or unspecified chronic kidney disease: Secondary | ICD-10-CM | POA: Diagnosis not present

## 2018-10-05 DIAGNOSIS — R3129 Other microscopic hematuria: Secondary | ICD-10-CM | POA: Diagnosis not present

## 2018-10-05 DIAGNOSIS — C678 Malignant neoplasm of overlapping sites of bladder: Secondary | ICD-10-CM | POA: Diagnosis not present

## 2018-10-13 DIAGNOSIS — R8271 Bacteriuria: Secondary | ICD-10-CM | POA: Diagnosis not present

## 2018-10-13 DIAGNOSIS — C61 Malignant neoplasm of prostate: Secondary | ICD-10-CM | POA: Diagnosis not present

## 2018-10-13 DIAGNOSIS — R3121 Asymptomatic microscopic hematuria: Secondary | ICD-10-CM | POA: Diagnosis not present

## 2018-10-13 DIAGNOSIS — Z8551 Personal history of malignant neoplasm of bladder: Secondary | ICD-10-CM | POA: Diagnosis not present

## 2018-10-19 DIAGNOSIS — C61 Malignant neoplasm of prostate: Secondary | ICD-10-CM | POA: Diagnosis not present

## 2018-10-19 DIAGNOSIS — I10 Essential (primary) hypertension: Secondary | ICD-10-CM | POA: Diagnosis not present

## 2018-10-19 DIAGNOSIS — Z1331 Encounter for screening for depression: Secondary | ICD-10-CM | POA: Diagnosis not present

## 2018-10-19 DIAGNOSIS — C679 Malignant neoplasm of bladder, unspecified: Secondary | ICD-10-CM | POA: Diagnosis not present

## 2018-10-19 DIAGNOSIS — E7800 Pure hypercholesterolemia, unspecified: Secondary | ICD-10-CM | POA: Diagnosis not present

## 2018-10-20 ENCOUNTER — Other Ambulatory Visit (HOSPITAL_COMMUNITY): Payer: Self-pay | Admitting: Respiratory Therapy

## 2018-10-20 DIAGNOSIS — R0609 Other forms of dyspnea: Principal | ICD-10-CM

## 2018-11-02 ENCOUNTER — Encounter (HOSPITAL_COMMUNITY): Payer: BLUE CROSS/BLUE SHIELD

## 2018-12-01 DIAGNOSIS — R635 Abnormal weight gain: Secondary | ICD-10-CM | POA: Diagnosis not present

## 2018-12-01 DIAGNOSIS — R0609 Other forms of dyspnea: Secondary | ICD-10-CM | POA: Diagnosis not present

## 2018-12-01 DIAGNOSIS — N183 Chronic kidney disease, stage 3 (moderate): Secondary | ICD-10-CM | POA: Diagnosis not present

## 2018-12-01 DIAGNOSIS — G629 Polyneuropathy, unspecified: Secondary | ICD-10-CM | POA: Diagnosis not present

## 2018-12-15 DIAGNOSIS — E538 Deficiency of other specified B group vitamins: Secondary | ICD-10-CM | POA: Diagnosis not present

## 2018-12-15 DIAGNOSIS — E7800 Pure hypercholesterolemia, unspecified: Secondary | ICD-10-CM | POA: Diagnosis not present

## 2018-12-15 DIAGNOSIS — N183 Chronic kidney disease, stage 3 (moderate): Secondary | ICD-10-CM | POA: Diagnosis not present

## 2018-12-15 DIAGNOSIS — R635 Abnormal weight gain: Secondary | ICD-10-CM | POA: Diagnosis not present

## 2018-12-16 DIAGNOSIS — E538 Deficiency of other specified B group vitamins: Secondary | ICD-10-CM | POA: Diagnosis not present

## 2018-12-22 DIAGNOSIS — Z8551 Personal history of malignant neoplasm of bladder: Secondary | ICD-10-CM | POA: Diagnosis not present

## 2018-12-29 DIAGNOSIS — N183 Chronic kidney disease, stage 3 (moderate): Secondary | ICD-10-CM | POA: Diagnosis not present

## 2018-12-29 DIAGNOSIS — R0600 Dyspnea, unspecified: Secondary | ICD-10-CM | POA: Diagnosis not present

## 2018-12-29 DIAGNOSIS — Z20818 Contact with and (suspected) exposure to other bacterial communicable diseases: Secondary | ICD-10-CM | POA: Diagnosis not present

## 2018-12-29 DIAGNOSIS — J0100 Acute maxillary sinusitis, unspecified: Secondary | ICD-10-CM | POA: Diagnosis not present

## 2018-12-29 DIAGNOSIS — I129 Hypertensive chronic kidney disease with stage 1 through stage 4 chronic kidney disease, or unspecified chronic kidney disease: Secondary | ICD-10-CM | POA: Diagnosis not present

## 2019-01-05 DIAGNOSIS — C61 Malignant neoplasm of prostate: Secondary | ICD-10-CM | POA: Diagnosis not present

## 2019-01-05 DIAGNOSIS — R3121 Asymptomatic microscopic hematuria: Secondary | ICD-10-CM | POA: Diagnosis not present

## 2019-01-05 DIAGNOSIS — Z8551 Personal history of malignant neoplasm of bladder: Secondary | ICD-10-CM | POA: Diagnosis not present

## 2019-01-11 DIAGNOSIS — E538 Deficiency of other specified B group vitamins: Secondary | ICD-10-CM | POA: Diagnosis not present

## 2019-01-12 DIAGNOSIS — C678 Malignant neoplasm of overlapping sites of bladder: Secondary | ICD-10-CM | POA: Diagnosis not present

## 2019-01-12 DIAGNOSIS — Z5111 Encounter for antineoplastic chemotherapy: Secondary | ICD-10-CM | POA: Diagnosis not present

## 2019-01-14 ENCOUNTER — Other Ambulatory Visit: Payer: Self-pay

## 2019-01-14 ENCOUNTER — Other Ambulatory Visit (HOSPITAL_COMMUNITY): Payer: Self-pay | Admitting: Registered Nurse

## 2019-01-14 ENCOUNTER — Other Ambulatory Visit: Payer: Self-pay | Admitting: Registered Nurse

## 2019-01-14 ENCOUNTER — Ambulatory Visit (HOSPITAL_COMMUNITY)
Admission: RE | Admit: 2019-01-14 | Discharge: 2019-01-14 | Disposition: A | Discharge status: Home / Self Care | Payer: BLUE CROSS/BLUE SHIELD | Source: Ambulatory Visit | Attending: Registered Nurse | Admitting: Registered Nurse

## 2019-01-14 DIAGNOSIS — N183 Chronic kidney disease, stage 3 (moderate): Secondary | ICD-10-CM | POA: Diagnosis not present

## 2019-01-14 DIAGNOSIS — R0789 Other chest pain: Secondary | ICD-10-CM

## 2019-01-14 DIAGNOSIS — I129 Hypertensive chronic kidney disease with stage 1 through stage 4 chronic kidney disease, or unspecified chronic kidney disease: Secondary | ICD-10-CM | POA: Diagnosis not present

## 2019-01-14 DIAGNOSIS — R0600 Dyspnea, unspecified: Secondary | ICD-10-CM | POA: Diagnosis not present

## 2019-01-14 MED ORDER — IOHEXOL 350 MG/ML SOLN
80.0000 mL | Freq: Once | INTRAVENOUS | Status: AC | PRN
  Administered 2019-01-14: 80 mL via INTRAVENOUS

## 2019-01-14 MED ORDER — SODIUM CHLORIDE (PF) 0.9 % IJ SOLN
INTRAMUSCULAR | Status: DC
Start: 2019-01-14 — End: 2019-01-15
  Filled 2019-01-14: qty 50

## 2019-01-17 DIAGNOSIS — N183 Chronic kidney disease, stage 3 (moderate): Secondary | ICD-10-CM | POA: Diagnosis not present

## 2019-01-17 DIAGNOSIS — E785 Hyperlipidemia, unspecified: Secondary | ICD-10-CM | POA: Diagnosis not present

## 2019-01-18 ENCOUNTER — Telehealth: Payer: Self-pay | Admitting: Internal Medicine

## 2019-01-18 DIAGNOSIS — E538 Deficiency of other specified B group vitamins: Secondary | ICD-10-CM | POA: Diagnosis not present

## 2019-01-18 NOTE — Telephone Encounter (Signed)
Called Anderson Malta back with [employer]- she states that she was gone on vacation and had spoken with Zigmund Daniel on getting the patient referral information and demographic sheet so they could get patient scheduled, and then spoke with schedulers who said there was no information on him. She would like to know if there is anything else that should be done. She just sent the referral through proficient today- but she states if you need anything else to call her at [phone] ext [phone]  She states this is urgent for Dr.Hilty referral. Thank you!

## 2019-01-18 NOTE — Telephone Encounter (Signed)
New Message     Anderson Malta from [employer] is needing Dr Lysbeth Penner nurse to call her   Please call back

## 2019-01-21 ENCOUNTER — Ambulatory Visit: Payer: BLUE CROSS/BLUE SHIELD | Admitting: Cardiovascular Disease

## 2019-01-21 ENCOUNTER — Other Ambulatory Visit: Payer: Self-pay

## 2019-01-21 DIAGNOSIS — E782 Mixed hyperlipidemia: Secondary | ICD-10-CM

## 2019-01-21 DIAGNOSIS — I10 Essential (primary) hypertension: Secondary | ICD-10-CM

## 2019-01-21 DIAGNOSIS — I2510 Atherosclerotic heart disease of native coronary artery without angina pectoris: Secondary | ICD-10-CM

## 2019-01-21 MED ORDER — ASPIRIN 325 MG PO TABS: 325.0000 mg | ORAL_TABLET | Freq: Every day | ORAL | 3 refills | Status: DC

## 2019-01-21 MED ORDER — ASPIRIN EC 81 MG PO TBEC: 81.0000 mg | DELAYED_RELEASE_TABLET | Freq: Every day | ORAL | 3 refills | Status: DC

## 2019-01-21 MED ORDER — METOPROLOL TARTRATE 100 MG PO TABS: 100.0000 mg | ORAL_TABLET | Freq: Once | ORAL | 0 refills | Status: DC

## 2019-01-21 NOTE — Assessment & Plan Note (Signed)
Recent 20 because of congestion that showed mild aortic atherosclerosis and coronary calcification.  Has risk factors including family history, hypertension hyperlipidemia.  To get a coronary CTA to further evaluate.

## 2019-01-21 NOTE — Patient Instructions (Addendum)
Please arrive at the [facility] main entrance of [facility] at xx:xx AM (30-45 minutes prior to test start time)  [facility] [place], [place] [address] [phone]  Proceed to the [facility] Radiology Department (First Floor).  Please follow these instructions carefully (unless otherwise directed):  Hold all erectile dysfunction medications at least 48 hours prior to test.  LAB WORK: Your physician recommends that you return for lab work 3-7 [place] CTA (COMPLETE BLOOD COUNT AND BASIC METABOLIC PANEL)   On the Night Before the Test: . Be sure to Drink plenty of water. . Do not consume any caffeinated/decaffeinated beverages or chocolate 12 hours prior to your test. . Do not take any antihistamines 12 hours prior to your test.  On the Day of the Test: . Drink plenty of water. Do not drink any water within one hour of the test. . Do not eat any food 4 hours prior to the test. . You may take your regular medications prior to the test.  . Take metoprolol (Lopressor) 100 MG two hours prior to test. . HOLD Furosemide morning of the test.        After the Test: . Drink plenty of water. . After receiving IV contrast, you may experience a mild flushed feeling. This is normal. . On occasion, you may experience a mild rash up to 24 hours after the test. This is not dangerous. If this occurs, you can take Benadryl 25 mg and increase your fluid intake. . If you experience trouble breathing, this can be serious. If it is severe call 911 IMMEDIATELY. If it is mild, please call our office. . If you take any of these medications: Glipizide/Metformin, Avandament, Glucavance, please do not take 48 hours after completing test.   Medication Instructions:  Your physician recommends that you continue on your current medications as directed. Please refer to the Current Medication list given to you today.   If you need a refill  on your cardiac medications before your next appointment, please call your pharmacy.   Follow-Up: At [facility], you and your health needs are our priority.  As part of our continuing mission to provide you with exceptional heart care, we have created designated Provider Care Teams.  These Care Teams include your primary Cardiologist (physician) and Advanced Practice Providers (APPs -  Physician Assistants and Nurse Practitioners) who all work together to provide you with the care you need, when you need it. . You will need a follow up appointment as needed unless your results are abnormal. You may see Dr. [name] or one of the following Advanced Practice Providers on your designated Care Team:   . Kerin Ransom, [place] . Almyra Deforest, PA-C . Fabian Sharp, PA-C . Jory Sims, DNP . Rosaria Ferries, PA-C . Roby Lofts, PA-C . Sande Rives, PA-C

## 2019-01-21 NOTE — Progress Notes (Signed)
01/21/2019 [name]   [birth year]  [id number]  Primary Physician Tisovec, Fransico Him, MD Primary Cardiologist: Lorretta Harp MD [name], [place]  HPI:  [name] is a 68 y.o. 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  He was referred by Dr. Osborne Casco for cardiovascular valuation because of recent chest CT performed 01/14/2019 that showed coronary calcification.  His risk factors include family history with a father who has had stents, treated hypertension and hyperlipidemia.  He is never smoked.  He is never had a heart attack or stroke.  He denies chest pain but has had some shortness of breath probably related to upper respiratory tract infection currently improved after a prednisone Dosepak.   Current Meds  Medication Sig  . acetaminophen (TYLENOL) 500 MG tablet Take 500 mg by mouth every 6 (six) hours as needed for mild pain.  Marland Kitchen amLODipine (NORVASC) 5 MG tablet Take 5 mg by mouth daily.  Marland Kitchen aspirin 325 MG tablet Take 325 mg by mouth daily.    . furosemide (LASIX) 20 MG tablet Take 20 mg by mouth daily.  Marland Kitchen loratadine (CLARITIN) 10 MG tablet Take 10 mg by mouth daily.  . meclizine (ANTIVERT) 25 MG tablet Take 1 tablet (25 mg total) by mouth 3 (three) times daily as needed for dizziness.  . montelukast (SINGULAIR) 10 MG tablet Take 10 mg by mouth at bedtime.  . Patiromer Sorbitex Calcium (VELTASSA) 16.8 g PACK Take 16.8 mg by mouth daily.  . phenazopyridine (PYRIDIUM) 200 MG tablet Take 1 tablet (200 mg total) by mouth 3 (three) times daily as needed for pain.  . simvastatin (ZOCOR) 40 MG tablet Take 40 mg by mouth daily.  . Telmisartan-Amlodipine (TWYNSTA) 80-5 MG TABS Take 0.5 tablets by mouth every evening. Patient taking 1/2 tablet daily.  . [DISCONTINUED] atorvastatin (LIPITOR) 20 MG tablet Take 1 tablet (20 mg total) by mouth daily at 6 PM.     Allergies  Allergen Reactions  .  Vibramycin [Doxycycline Calcium] Hives, Nausea Only and Other (See Comments)    fever  . Mushroom Extract Complex Hives and Nausea And Vomiting  . Succinylcholine Chloride Other (See Comments)    Postoperative myalgia    Social History   Socioeconomic History  . Marital status: Married    Spouse name: Not on file  . Number of children: Not on file  . Years of education: Not on file  . Highest education level: Not on file  Occupational History  . Not on file  Social Needs  . Financial resource strain: Not on file  . Food insecurity:    Worry: Not on file    Inability: Not on file  . Transportation needs:    Medical: Not on file    Non-medical: Not on file  Tobacco Use  . Smoking status: Never Smoker  . Smokeless tobacco: Never Used  Substance and Sexual Activity  . Alcohol use: Yes    Comment: rare  . Drug use: No  . Sexual activity: Yes  Lifestyle  . Physical activity:    Days per week: Not on file    Minutes per session: Not on file  . Stress: Not on file  Relationships  . Social connections:    Talks on phone: Not on file    Gets together: Not on file    Attends religious service: Not on file    Active  member of club or organization: Not on file    Attends meetings of clubs or organizations: Not on file    Relationship status: Not on file  . Intimate partner violence:    Fear of current or ex partner: Not on file    Emotionally abused: Not on file    Physically abused: Not on file    Forced sexual activity: Not on file  Other Topics Concern  . Not on file  Social History Narrative  . Not on file     Review of Systems: General: negative for chills, fever, night sweats or weight changes.  Cardiovascular: negative for chest pain, dyspnea on exertion, edema, orthopnea, palpitations, paroxysmal nocturnal dyspnea or shortness of breath Dermatological: negative for rash Respiratory: negative for cough or wheezing Urologic: negative for hematuria Abdominal:  negative for nausea, vomiting, diarrhea, bright red blood per rectum, melena, or hematemesis Neurologic: negative for visual changes, syncope, or dizziness All other systems reviewed and are otherwise negative except as noted above.    Blood pressure 128/80, pulse 93, temperature 99 F (37.2 C), height 5\' 10"  (1.778 m), weight 235 lb (106.6 kg).  General appearance: alert and no distress Neck: no adenopathy, no carotid bruit, no JVD, supple, symmetrical, trachea midline and thyroid not enlarged, symmetric, no tenderness/mass/nodules Lungs: clear to auscultation bilaterally Heart: regular rate and rhythm, S1, S2 normal, no murmur, click, rub or gallop Extremities: extremities normal, atraumatic, no cyanosis or edema Pulses: 2+ and symmetric Skin: Skin color, texture, turgor normal. No rashes or lesions Neurologic: Alert and oriented X 3, normal strength and tone. Normal symmetric reflexes. Normal coordination and gait  EKG not performed today  ASSESSMENT AND PLAN:   Coronary artery calcification seen on CAT scan Recent 20 because of congestion that showed mild aortic atherosclerosis and coronary calcification.  Has risk factors including family history, hypertension hyperlipidemia.  To get a coronary CTA to further evaluate.  Hyperlipidemia History of hyperlipidemia on simvastatin 40 with recent lipid profile performed 09/21/2018 revealing total cholesterol 166 with an HDL of 34.  HTN (hypertension) History of essential hypertension with blood pressure measured today 128/80.  He is on amlodipine and telmisartan.      Lorretta Harp MD FACP,FACC,FAHA, [facility] 01/21/2019 9:03 AM

## 2019-01-21 NOTE — Addendum Note (Signed)
Addended by: Annita Brod on: 01/21/2019 09:32 AM   Modules accepted: Orders

## 2019-01-21 NOTE — Assessment & Plan Note (Signed)
History of hyperlipidemia on simvastatin 40 with recent lipid profile performed 09/21/2018 revealing total cholesterol 166 with an HDL of 34.

## 2019-01-21 NOTE — Assessment & Plan Note (Signed)
History of essential hypertension with blood pressure measured today 128/80.  He is on amlodipine and telmisartan.

## 2019-01-21 NOTE — Addendum Note (Signed)
Addended by: Annita Brod on: 01/21/2019 09:19 AM   Modules accepted: Orders

## 2019-01-24 DIAGNOSIS — K219 Gastro-esophageal reflux disease without esophagitis: Secondary | ICD-10-CM | POA: Diagnosis not present

## 2019-01-24 DIAGNOSIS — R0981 Nasal congestion: Secondary | ICD-10-CM | POA: Diagnosis not present

## 2019-01-24 DIAGNOSIS — J309 Allergic rhinitis, unspecified: Secondary | ICD-10-CM | POA: Diagnosis not present

## 2019-01-24 DIAGNOSIS — I129 Hypertensive chronic kidney disease with stage 1 through stage 4 chronic kidney disease, or unspecified chronic kidney disease: Secondary | ICD-10-CM | POA: Diagnosis not present

## 2019-01-25 DIAGNOSIS — I129 Hypertensive chronic kidney disease with stage 1 through stage 4 chronic kidney disease, or unspecified chronic kidney disease: Secondary | ICD-10-CM | POA: Diagnosis not present

## 2019-01-25 DIAGNOSIS — N183 Chronic kidney disease, stage 3 (moderate): Secondary | ICD-10-CM | POA: Diagnosis not present

## 2019-01-25 DIAGNOSIS — N022 Recurrent and persistent hematuria with diffuse membranous glomerulonephritis: Secondary | ICD-10-CM | POA: Diagnosis not present

## 2019-01-25 DIAGNOSIS — R809 Proteinuria, unspecified: Secondary | ICD-10-CM | POA: Diagnosis not present

## 2019-01-26 ENCOUNTER — Telehealth: Payer: Self-pay | Admitting: Cardiovascular Disease

## 2019-01-26 DIAGNOSIS — I2510 Atherosclerotic heart disease of native coronary artery without angina pectoris: Secondary | ICD-10-CM

## 2019-01-26 NOTE — Telephone Encounter (Signed)
Change coronary CTA to [facility]

## 2019-01-26 NOTE — Telephone Encounter (Signed)
Patient called about his CT that he is scheduled for. He stated that his Kidney doctor preferred that the test be done without contrast if possible.  Please advise

## 2019-01-28 DIAGNOSIS — J310 Chronic rhinitis: Secondary | ICD-10-CM | POA: Diagnosis not present

## 2019-01-28 DIAGNOSIS — J9809 Other diseases of bronchus, not elsewhere classified: Secondary | ICD-10-CM | POA: Diagnosis not present

## 2019-01-28 DIAGNOSIS — J329 Chronic sinusitis, unspecified: Secondary | ICD-10-CM | POA: Diagnosis not present

## 2019-01-30 NOTE — Telephone Encounter (Signed)
Coronary cta order d/c in epic. lexiscan ordered

## 2019-01-30 NOTE — Telephone Encounter (Signed)
Will contact pt to update

## 2019-01-31 ENCOUNTER — Telehealth: Payer: Self-pay | Admitting: *Deleted

## 2019-01-31 NOTE — Telephone Encounter (Signed)
Please read patient mychart message 01/31/19 - he is concerned about having ccta due to kidney issues

## 2019-01-31 NOTE — Telephone Encounter (Signed)
OK to get Lexiscan instead of CTA

## 2019-02-01 DIAGNOSIS — E538 Deficiency of other specified B group vitamins: Secondary | ICD-10-CM | POA: Diagnosis not present

## 2019-02-01 DIAGNOSIS — I129 Hypertensive chronic kidney disease with stage 1 through stage 4 chronic kidney disease, or unspecified chronic kidney disease: Secondary | ICD-10-CM | POA: Diagnosis not present

## 2019-02-02 NOTE — Telephone Encounter (Signed)
Left message on voice mail - information  wil be switching  Test to [place] - scheduler will contact you with date of the test   referring  Chart at looks as lexiscan has been schedule

## 2019-02-09 DIAGNOSIS — E538 Deficiency of other specified B group vitamins: Secondary | ICD-10-CM | POA: Diagnosis not present

## 2019-02-14 ENCOUNTER — Encounter (HOSPITAL_COMMUNITY): Payer: Self-pay | Admitting: Cardiovascular Disease

## 2019-03-09 ENCOUNTER — Telehealth (HOSPITAL_COMMUNITY): Payer: Self-pay

## 2019-03-09 NOTE — Telephone Encounter (Signed)
Encounter complete. 

## 2019-03-11 ENCOUNTER — Ambulatory Visit (HOSPITAL_COMMUNITY)
Admission: RE | Admit: 2019-03-11 | Discharge: 2019-03-11 | Disposition: A | Discharge status: Home / Self Care | Payer: BC Managed Care – PPO | Source: Ambulatory Visit | Attending: Internal Medicine | Admitting: Internal Medicine

## 2019-03-11 ENCOUNTER — Other Ambulatory Visit: Payer: Self-pay

## 2019-03-11 DIAGNOSIS — I2510 Atherosclerotic heart disease of native coronary artery without angina pectoris: Secondary | ICD-10-CM | POA: Diagnosis not present

## 2019-03-11 LAB — MYOCARDIAL PERFUSION IMAGING
LV dias vol: 142 mL (ref 62–150)
LV sys vol: 92 mL
Peak HR: 102 {beats}/min
Rest HR: 63 {beats}/min
SDS: 6
SRS: 6
SSS: 12
TID: 1.17

## 2019-03-11 MED ORDER — TECHNETIUM TC 99M TETROFOSMIN IV KIT
30.5000 | PACK | Freq: Once | INTRAVENOUS | Status: AC | PRN
  Administered 2019-03-11: 30.5 via INTRAVENOUS
  Filled 2019-03-11: qty 31

## 2019-03-11 MED ORDER — REGADENOSON 0.4 MG/5ML IV SOLN
0.4000 mg | Freq: Once | INTRAVENOUS | Status: AC
  Administered 2019-03-11: 0.4 mg via INTRAVENOUS

## 2019-03-11 MED ORDER — TECHNETIUM TC 99M TETROFOSMIN IV KIT
10.9000 | PACK | Freq: Once | INTRAVENOUS | Status: AC | PRN
  Administered 2019-03-11: 10.9 via INTRAVENOUS
  Filled 2019-03-11: qty 11

## 2019-03-17 DIAGNOSIS — E538 Deficiency of other specified B group vitamins: Secondary | ICD-10-CM | POA: Diagnosis not present

## 2019-03-21 ENCOUNTER — Telehealth: Payer: Self-pay

## 2019-03-21 NOTE — Telephone Encounter (Signed)
    COVID-19 Pre-Screening Questions:  . In the past 7 to 10 days have you had a cough,  shortness of breath, headache, congestion, fever (100 or greater) body aches, chills, sore throat, or sudden loss of taste or sense of smell? HAS HAD COUGH THAT HE HAS HAD SINCE LAST FALL AND WORSENED STARTING 4 MONTHS AGO WHEN ALLERGY SEASON STARTED. HE WAS TESTED FOR COVID-19 ABOUT 5 WEEKS AGO AND WAS NEGATIVE. NO OTHER SYMPTOMS . Have you been around anyone with known Covid 19? SON'S FIANCE TESTED POSITIVE 6-7 WEEKS AGO BUT HAS NOT BEEN AROUND HER SINCE LATE MARCH. SHE LIVES IN [place], [place] . Have you been around anyone who is awaiting Covid 19 test results in the past 7 to 10 days? NO . Have you been around anyone who has been exposed to Covid 19, or has mentioned symptoms of Covid 19 within the past 7 to 10 days? NO  If you have any concerns/questions about symptoms patients report during screening (either on the phone or at threshold). Contact the provider seeing the patient or DOD for further guidance.  If neither are available contact a member of the leadership team.     Pt scheduled for 8/4 at 2:30 pm with Dr. [name]. Pt aware of mask policy and guest policy

## 2019-03-22 ENCOUNTER — Ambulatory Visit (INDEPENDENT_AMBULATORY_CARE_PROVIDER_SITE_OTHER): Payer: BC Managed Care – PPO | Admitting: Cardiovascular Disease

## 2019-03-22 ENCOUNTER — Encounter: Payer: Self-pay | Admitting: Cardiovascular Disease

## 2019-03-22 ENCOUNTER — Other Ambulatory Visit: Payer: Self-pay

## 2019-03-22 VITALS — BP 128/76 | HR 84 | Temp 97.6°F | Ht 70.5 in | Wt 246.2 lb

## 2019-03-22 DIAGNOSIS — E782 Mixed hyperlipidemia: Secondary | ICD-10-CM

## 2019-03-22 DIAGNOSIS — I519 Heart disease, unspecified: Secondary | ICD-10-CM | POA: Diagnosis not present

## 2019-03-22 DIAGNOSIS — I2510 Atherosclerotic heart disease of native coronary artery without angina pectoris: Secondary | ICD-10-CM | POA: Diagnosis not present

## 2019-03-22 DIAGNOSIS — I10 Essential (primary) hypertension: Secondary | ICD-10-CM

## 2019-03-22 NOTE — Addendum Note (Signed)
Addended by: Annita Brod on: 03/22/2019 03:36 PM   Modules accepted: Orders

## 2019-03-22 NOTE — Assessment & Plan Note (Signed)
History of essential hypertension her blood pressure measured today 120/76.  He is on amlodipine, metoprolol, telmisartan.

## 2019-03-22 NOTE — Assessment & Plan Note (Signed)
History of hyperlipidemia on simvastatin with lipid profile performed/1/20 revealing total cholesterol 147, triglyceride of 203 and HDL of 44.

## 2019-03-22 NOTE — Assessment & Plan Note (Signed)
A Myoview stress test was performed was read as high risk with ischemia.  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  If his LV function is normal we will continue to treat him medically.  If his LV function is indeed depressed we will proceed with outpatient cardiac cath.

## 2019-03-22 NOTE — Patient Instructions (Signed)
Medication Instructions:  Your physician recommends that you continue on your current medications as directed. Please refer to the Current Medication list given to you today.  If you need a refill on your cardiac medications before your next appointment, please call your pharmacy.   Lab work: none If you have labs (blood work) drawn today and your tests are completely normal, you will receive your results only by: Marland Kitchen MyChart Message (if you have MyChart) OR . A paper copy in the mail If you have any lab test that is abnormal or we need to change your treatment, we will call you to review the results.  Testing/Procedures: Your physician has requested that you have an echocardiogram. Echocardiography is a painless test that uses sound waves to create images of your heart. It provides your doctor with information about the size and shape of your heart and how well your heart's chambers and valves are working. This procedure takes approximately one hour. There are no restrictions for this procedure. LOCATION: [facility]: [place], [place], [place] [address]  TO BE SCHEDULED    Follow-Up: At [facility], you and your health needs are our priority.  As part of our continuing mission to provide you with exceptional heart care, we have created designated Provider Care Teams.  These Care Teams include your primary Cardiologist (physician) and Advanced Practice Providers (APPs -  Physician Assistants and Nurse Practitioners) who all work together to provide you with the care you need, when you need it. You will need a follow up appointment in 6 months WITH DR. [name] UNLESS YOUR RESULTS ARE ABNORMAL.  Please call our office 2 months in advance to schedule this appointment.

## 2019-03-22 NOTE — Progress Notes (Signed)
03/22/2019 ROWEN WILMER   10-11-1950  [id number]  Primary Physician Tisovec, Fransico Him, MD Primary Cardiologist: Lorretta Harp MD [name], [place]  HPI:  NEKHI LIWANAG is a 68 y.o.  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  He was referred by Dr. Osborne Casco for cardiovascular valuation because of recent chest CT performed 01/14/2019 that showed coronary calcification.  I last saw him in the office 01/21/2019. His risk factors include family history with a father who has had stents, treated hypertension and hyperlipidemia.  He is never smoked.  He is never had a heart attack or stroke.  He denies chest pain but has had some shortness of breath probably related to upper respiratory tract infection currently improved after a prednisone Dosepak.  Because of his coronary calcification I performed a Myoview stress testing 03/11/2019 which was read as high risk with an EF of 36% and inferior ischemia although to my eye this appeared to be more consistent with diaphragmatic attenuation.  Patient is completely asymptomatic.    Current Meds  Medication Sig  . acetaminophen (TYLENOL) 500 MG tablet Take 500 mg by mouth every 6 (six) hours as needed for mild pain.  Marland Kitchen amLODipine (NORVASC) 5 MG tablet Take 5 mg by mouth daily.  Marland Kitchen aspirin 325 MG tablet Take 1 tablet (325 mg total) by mouth daily.  . furosemide (LASIX) 20 MG tablet Take 20 mg by mouth daily.  Marland Kitchen loratadine (CLARITIN) 10 MG tablet Take 10 mg by mouth daily.  . meclizine (ANTIVERT) 25 MG tablet Take 1 tablet (25 mg total) by mouth 3 (three) times daily as needed for dizziness.  . metoprolol tartrate (LOPRESSOR) 100 MG tablet Take 1 tablet (100 mg total) by mouth once for 1 dose. Take this 2 hours prior to your scheduled coronary CTA  . Telmisartan-Amlodipine (TWYNSTA) 80-5 MG TABS Take 0.5 tablets by mouth every evening. Patient taking 1/2 tablet daily.      Allergies  Allergen Reactions  . Vibramycin [Doxycycline Calcium] Hives, Nausea Only and Other (See Comments)    fever  . Mushroom Extract Complex Hives and Nausea And Vomiting  . Succinylcholine Chloride Other (See Comments)    Postoperative myalgia    Social History   Socioeconomic History  . Marital status: Married    Spouse name: Not on file  . Number of children: Not on file  . Years of education: Not on file  . Highest education level: Not on file  Occupational History  . Not on file  Social Needs  . Financial resource strain: Not on file  . Food insecurity    Worry: Not on file    Inability: Not on file  . Transportation needs    Medical: Not on file    Non-medical: Not on file  Tobacco Use  . Smoking status: Never Smoker  . Smokeless tobacco: Never Used  Substance and Sexual Activity  . Alcohol use: Yes    Comment: rare  . Drug use: No  . Sexual activity: Yes  Lifestyle  . Physical activity    Days per week: Not on file    Minutes per session: Not on file  . Stress: Not on file  Relationships  . Social Herbalist on phone: Not on file    Gets together: Not on file    Attends religious service: Not on file  Active member of club or organization: Not on file    Attends meetings of clubs or organizations: Not on file    Relationship status: Not on file  . Intimate partner violence    Fear of current or ex partner: Not on file    Emotionally abused: Not on file    Physically abused: Not on file    Forced sexual activity: Not on file  Other Topics Concern  . Not on file  Social History Narrative  . Not on file     Review of Systems: General: negative for chills, fever, night sweats or weight changes.  Cardiovascular: negative for chest pain, dyspnea on exertion, edema, orthopnea, palpitations, paroxysmal nocturnal dyspnea or shortness just vented breath Dermatological: negative for rash Respiratory: negative for cough or wheezing  Urologic: negative for hematuria Abdominal: negative for nausea, vomiting, diarrhea, bright red blood per rectum, melena, or hematemesis Neurologic: negative for visual changes, syncope, or dizziness All other systems reviewed and are otherwise negative except as noted above.    Blood pressure 128/76, pulse 84, temperature 97.6 F (36.4 C), height 5' 10.5" (1.791 m), weight 246 lb 3.2 oz (111.7 kg), SpO2 97 %.  General appearance: alert and no distress Neck: no adenopathy, no carotid bruit, no JVD, supple, symmetrical, trachea midline and thyroid not enlarged, symmetric, no tenderness/mass/nodules Lungs: clear to auscultation bilaterally Heart: regular rate and rhythm, S1, S2 normal, no murmur, click, rub or gallop Extremities: extremities normal, atraumatic, no cyanosis or edema Pulses: 2+ and symmetric Skin: Skin color, texture, turgor normal. No rashes or lesions Neurologic: Alert and oriented X 3, normal strength and tone. Normal symmetric reflexes. Normal coordination and gait  EKG not performed today  ASSESSMENT AND PLAN:   HTN (hypertension) History of essential hypertension her blood pressure measured today 120/76.  He is on amlodipine, metoprolol, telmisartan.  Hyperlipidemia History of hyperlipidemia on simvastatin with lipid profile performed/1/20 revealing total cholesterol 147, triglyceride of 203 and HDL of 44.  Coronary artery calcification seen on CAT scan A Myoview stress test was performed was read as high risk with ischemia.  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  If his LV function is normal we will continue to treat him medically.  If his LV function is indeed depressed we will proceed with outpatient cardiac cath.      Lorretta Harp MD FACP,FACC,FAHA,  [facility] 03/22/2019 3:23 PM

## 2019-03-24 DIAGNOSIS — E538 Deficiency of other specified B group vitamins: Secondary | ICD-10-CM | POA: Diagnosis not present

## 2019-03-29 ENCOUNTER — Ambulatory Visit (HOSPITAL_COMMUNITY): Payer: BC Managed Care – PPO | Attending: Cardiology

## 2019-03-29 ENCOUNTER — Other Ambulatory Visit: Payer: Self-pay

## 2019-03-29 DIAGNOSIS — I519 Heart disease, unspecified: Secondary | ICD-10-CM | POA: Diagnosis not present

## 2019-03-30 ENCOUNTER — Other Ambulatory Visit: Payer: Self-pay | Admitting: *Deleted

## 2019-03-30 DIAGNOSIS — I77810 Thoracic aortic ectasia: Secondary | ICD-10-CM

## 2019-03-30 DIAGNOSIS — C61 Malignant neoplasm of prostate: Secondary | ICD-10-CM | POA: Diagnosis not present

## 2019-04-06 DIAGNOSIS — C61 Malignant neoplasm of prostate: Secondary | ICD-10-CM | POA: Diagnosis not present

## 2019-04-06 DIAGNOSIS — R972 Elevated prostate specific antigen [PSA]: Secondary | ICD-10-CM | POA: Diagnosis not present

## 2019-04-06 DIAGNOSIS — Z8551 Personal history of malignant neoplasm of bladder: Secondary | ICD-10-CM | POA: Diagnosis not present

## 2019-04-06 DIAGNOSIS — R3121 Asymptomatic microscopic hematuria: Secondary | ICD-10-CM | POA: Diagnosis not present

## 2019-04-08 DIAGNOSIS — Z23 Encounter for immunization: Secondary | ICD-10-CM | POA: Diagnosis not present

## 2019-05-09 DIAGNOSIS — N183 Chronic kidney disease, stage 3 (moderate): Secondary | ICD-10-CM | POA: Diagnosis not present

## 2019-05-09 DIAGNOSIS — E785 Hyperlipidemia, unspecified: Secondary | ICD-10-CM | POA: Diagnosis not present

## 2019-05-09 DIAGNOSIS — N390 Urinary tract infection, site not specified: Secondary | ICD-10-CM | POA: Diagnosis not present

## 2019-05-11 DIAGNOSIS — N183 Chronic kidney disease, stage 3 (moderate): Secondary | ICD-10-CM | POA: Diagnosis not present

## 2019-05-11 DIAGNOSIS — R809 Proteinuria, unspecified: Secondary | ICD-10-CM | POA: Diagnosis not present

## 2019-05-11 DIAGNOSIS — I129 Hypertensive chronic kidney disease with stage 1 through stage 4 chronic kidney disease, or unspecified chronic kidney disease: Secondary | ICD-10-CM | POA: Diagnosis not present

## 2019-05-11 DIAGNOSIS — N2589 Other disorders resulting from impaired renal tubular function: Secondary | ICD-10-CM | POA: Diagnosis not present

## 2019-05-12 DIAGNOSIS — L814 Other melanin hyperpigmentation: Secondary | ICD-10-CM | POA: Diagnosis not present

## 2019-05-12 DIAGNOSIS — L820 Inflamed seborrheic keratosis: Secondary | ICD-10-CM | POA: Diagnosis not present

## 2019-05-12 DIAGNOSIS — L570 Actinic keratosis: Secondary | ICD-10-CM | POA: Diagnosis not present

## 2019-05-12 DIAGNOSIS — Z85820 Personal history of malignant melanoma of skin: Secondary | ICD-10-CM | POA: Diagnosis not present

## 2019-05-12 DIAGNOSIS — D225 Melanocytic nevi of trunk: Secondary | ICD-10-CM | POA: Diagnosis not present

## 2019-05-12 DIAGNOSIS — L821 Other seborrheic keratosis: Secondary | ICD-10-CM | POA: Diagnosis not present

## 2019-06-01 DIAGNOSIS — E538 Deficiency of other specified B group vitamins: Secondary | ICD-10-CM | POA: Diagnosis not present

## 2019-06-02 DIAGNOSIS — I700 Atherosclerosis of aorta: Secondary | ICD-10-CM | POA: Diagnosis not present

## 2019-06-02 DIAGNOSIS — E538 Deficiency of other specified B group vitamins: Secondary | ICD-10-CM | POA: Diagnosis not present

## 2019-06-02 DIAGNOSIS — Z20828 Contact with and (suspected) exposure to other viral communicable diseases: Secondary | ICD-10-CM | POA: Diagnosis not present

## 2019-06-02 DIAGNOSIS — C679 Malignant neoplasm of bladder, unspecified: Secondary | ICD-10-CM | POA: Diagnosis not present

## 2019-06-02 DIAGNOSIS — I2510 Atherosclerotic heart disease of native coronary artery without angina pectoris: Secondary | ICD-10-CM | POA: Diagnosis not present

## 2019-06-02 DIAGNOSIS — C61 Malignant neoplasm of prostate: Secondary | ICD-10-CM | POA: Diagnosis not present

## 2019-06-02 DIAGNOSIS — I129 Hypertensive chronic kidney disease with stage 1 through stage 4 chronic kidney disease, or unspecified chronic kidney disease: Secondary | ICD-10-CM | POA: Diagnosis not present

## 2019-06-13 DIAGNOSIS — C61 Malignant neoplasm of prostate: Secondary | ICD-10-CM | POA: Diagnosis not present

## 2019-06-20 DIAGNOSIS — R3121 Asymptomatic microscopic hematuria: Secondary | ICD-10-CM | POA: Diagnosis not present

## 2019-06-20 DIAGNOSIS — Z8551 Personal history of malignant neoplasm of bladder: Secondary | ICD-10-CM | POA: Diagnosis not present

## 2019-06-20 DIAGNOSIS — C61 Malignant neoplasm of prostate: Secondary | ICD-10-CM | POA: Diagnosis not present

## 2019-06-20 DIAGNOSIS — R972 Elevated prostate specific antigen [PSA]: Secondary | ICD-10-CM | POA: Diagnosis not present

## 2019-06-23 ENCOUNTER — Other Ambulatory Visit (HOSPITAL_COMMUNITY): Payer: Self-pay | Admitting: Urology

## 2019-06-23 ENCOUNTER — Other Ambulatory Visit: Payer: Self-pay | Admitting: Urology

## 2019-06-23 DIAGNOSIS — N329 Bladder disorder, unspecified: Secondary | ICD-10-CM

## 2019-06-23 DIAGNOSIS — C61 Malignant neoplasm of prostate: Secondary | ICD-10-CM

## 2019-06-30 ENCOUNTER — Encounter (HOSPITAL_COMMUNITY): Payer: Self-pay

## 2019-06-30 NOTE — Patient Instructions (Addendum)
DUE TO COVID-19 ONLY ONE VISITOR IS ALLOWED TO COME WITH YOU AND STAY IN THE WAITING ROOM ONLY DURING PRE OP AND PROCEDURE. THE ONE VISITOR MAY VISIT WITH YOU IN YOUR PRIVATE ROOM DURING VISITING HOURS ONLY!!   COVID SWAB TESTING MUST BE COMPLETED ON:  Today, Immediately after pre op appointment    [address] [place] [facility] pre surgical testing line (Must self quarantine after testing. Follow instructions on handout.)             Your procedure is scheduled on: Thursday, Nov. 19, 2020   Report to [facility] Main  Entrance   Report to Short Stay at 5:30 AM   Call this number if you have problems the morning of surgery [phone]   Do not eat food or drink liquids :After Midnight.   Brush your teeth the morning of surgery.   Do NOT smoke after Midnight   Take these medicines the morning of surgery with A SIP OF WATER:  Amlodipine, Simvastatin                                You may not have any metal on your body including jewelry, and body piercings             Do not wear lotions, powders, perfumes/cologne, or deodorant                           Men may shave face and neck.   Do not bring valuables to the hospital. [place].   Contacts, dentures or bridgework may not be worn into surgery.   Patients discharged the day of surgery will not be allowed to drive home.   Special Instructions: Bring a copy of your healthcare power of attorney and living will documents         the day of surgery if you haven't scanned them in before.              Please read over the following fact sheets you were given:  [facility] - Preparing for Surgery Before surgery, you can play an important role.  Because skin is not sterile, your skin needs to be as free of germs as possible.  You can reduce the number of germs on your skin by washing with CHG (chlorahexidine gluconate) soap before surgery.   CHG is an antiseptic cleaner which kills germs and bonds with the skin to continue killing germs even after washing. Please DO NOT use if you have an allergy to CHG or antibacterial soaps.  If your skin becomes reddened/irritated stop using the CHG and inform your nurse when you arrive at Short Stay. Do not shave (including legs and underarms) for at least 48 hours prior to the first CHG shower.  You may shave your face/neck.  Please follow these instructions carefully:  1.  Shower with CHG Soap the night before surgery and the  morning of surgery.  2.  If you choose to wash your hair, wash your hair first as usual with your normal  shampoo.  3.  After you shampoo, rinse your hair and body thoroughly to remove the shampoo.  4.  Use CHG as you would any other liquid soap.  You can apply chg directly to the skin and wash.  Gently with a scrungie or clean washcloth.  5.  Apply the CHG Soap to your body ONLY FROM THE NECK DOWN.   Do   not use on face/ open                           Wound or open sores. Avoid contact with eyes, ears mouth and   genitals (private parts).                       Wash face,  Genitals (private parts) with your normal soap.             6.  Wash thoroughly, paying special attention to the area where your    surgery  will be performed.  7.  Thoroughly rinse your body with warm water from the neck down.  8.  DO NOT shower/wash with your normal soap after using and rinsing off the CHG Soap.                9.  Pat yourself dry with a clean towel.            10.  Wear clean pajamas.            11.  Place clean sheets on your bed the night of your first shower and do not  sleep with pets. Day of Surgery : Do not apply any lotions/deodorants the morning of surgery.  Please wear clean clothes to the hospital/surgery center.  FAILURE TO FOLLOW THESE INSTRUCTIONS MAY RESULT IN THE CANCELLATION OF YOUR SURGERY  PATIENT  SIGNATURE_________________________________  NURSE SIGNATURE__________________________________  ________________________________________________________________________

## 2019-07-04 ENCOUNTER — Other Ambulatory Visit (HOSPITAL_COMMUNITY)
Admission: RE | Admit: 2019-07-04 | Discharge: 2019-07-04 | Disposition: A | Discharge status: Home / Self Care | Payer: BC Managed Care – PPO | Source: Ambulatory Visit | Attending: Urology | Admitting: Urology

## 2019-07-04 ENCOUNTER — Other Ambulatory Visit: Payer: Self-pay

## 2019-07-04 ENCOUNTER — Encounter (HOSPITAL_COMMUNITY)
Admission: RE | Admit: 2019-07-04 | Discharge: 2019-07-04 | Disposition: A | Discharge status: Home / Self Care | Payer: BC Managed Care – PPO | Source: Ambulatory Visit | Attending: Urology | Admitting: Urology

## 2019-07-04 ENCOUNTER — Encounter (HOSPITAL_COMMUNITY): Payer: Self-pay

## 2019-07-04 DIAGNOSIS — I129 Hypertensive chronic kidney disease with stage 1 through stage 4 chronic kidney disease, or unspecified chronic kidney disease: Secondary | ICD-10-CM | POA: Insufficient documentation

## 2019-07-04 DIAGNOSIS — Z01812 Encounter for preprocedural laboratory examination: Secondary | ICD-10-CM | POA: Insufficient documentation

## 2019-07-04 DIAGNOSIS — Z20828 Contact with and (suspected) exposure to other viral communicable diseases: Secondary | ICD-10-CM | POA: Diagnosis not present

## 2019-07-04 DIAGNOSIS — Z7982 Long term (current) use of aspirin: Secondary | ICD-10-CM | POA: Diagnosis not present

## 2019-07-04 DIAGNOSIS — N329 Bladder disorder, unspecified: Secondary | ICD-10-CM | POA: Diagnosis not present

## 2019-07-04 DIAGNOSIS — Z79899 Other long term (current) drug therapy: Secondary | ICD-10-CM | POA: Diagnosis not present

## 2019-07-04 DIAGNOSIS — C61 Malignant neoplasm of prostate: Secondary | ICD-10-CM | POA: Insufficient documentation

## 2019-07-04 DIAGNOSIS — E785 Hyperlipidemia, unspecified: Secondary | ICD-10-CM | POA: Insufficient documentation

## 2019-07-04 DIAGNOSIS — Z7901 Long term (current) use of anticoagulants: Secondary | ICD-10-CM | POA: Diagnosis not present

## 2019-07-04 DIAGNOSIS — N189 Chronic kidney disease, unspecified: Secondary | ICD-10-CM | POA: Diagnosis not present

## 2019-07-04 DIAGNOSIS — Z8719 Personal history of other diseases of the digestive system: Secondary | ICD-10-CM

## 2019-07-04 HISTORY — DX: Personal history of other diseases of the digestive system: Z87.19

## 2019-07-04 HISTORY — DX: Atherosclerotic heart disease of native coronary artery without angina pectoris: I25.10

## 2019-07-04 HISTORY — DX: Malignant neoplasm of bladder, unspecified: C67.9

## 2019-07-04 HISTORY — DX: Dyspnea, unspecified: R06.00

## 2019-07-04 LAB — BASIC METABOLIC PANEL
Anion gap: 8 (ref 5–15)
BUN: 23 mg/dL (ref 8–23)
CO2: 24 mmol/L (ref 22–32)
Calcium: 9.5 mg/dL (ref 8.9–10.3)
Chloride: 109 mmol/L (ref 98–111)
Creatinine, Ser: 1.39 mg/dL — ABNORMAL HIGH (ref 0.61–1.24)
GFR calc Af Amer: >60 mL/min (ref >60)
GFR calc non Af Amer: 52 mL/min — ABNORMAL LOW (ref >60)
Glucose, Bld: 89 mg/dL (ref 70–99)
Potassium: 5 mmol/L (ref 3.5–5.1)
Sodium: 141 mmol/L (ref 135–145)

## 2019-07-04 LAB — CBC
HCT: 41.6 % (ref 39.0–52.0)
Hemoglobin: 13.4 g/dL (ref 13.0–17.0)
MCH: 29.5 pg (ref 26.0–34.0)
MCHC: 32.2 g/dL (ref 30.0–36.0)
MCV: 91.6 fL (ref 80.0–100.0)
Platelets: 210 10*3/uL (ref 150–400)
RBC: 4.54 MIL/uL (ref 4.22–5.81)
RDW: 12.6 % (ref 11.5–15.5)
WBC: 6.9 10*3/uL (ref 4.0–10.5)
nRBC: 0 % (ref 0.0–0.2)

## 2019-07-04 NOTE — Progress Notes (Signed)
PCP - Dr. Osborne Casco Cardiologist -  Dr. Adora Fridge. Last office visit 03/22/2019  Chest x-ray - 01/14/2019. Epic EKG - 01/14/2019. Epic Stress Test - 03/11/2019. Epic ECHO - 03/29/2019. Epic Cardiac Cath -   Sleep Study - N/A CPAP -   Fasting Blood Sugar -  Checks Blood Sugar _____ times a day  Blood Thinner Instructions: Aspirin Instructions:Stop on 07/03/2019 instruct by MD. Last Dose:07/03/2019  Anesthesia review:   Patient denies shortness of breath, fever, cough and chest pain at PAT appointment   Patient verbalized understanding of instructions that were given to them at the PAT appointment. Patient was also instructed that they will need to review over the PAT instructions again at home before surgery.

## 2019-07-05 LAB — NOVEL CORONAVIRUS, NAA (HOSP ORDER, SEND-OUT TO REF LAB; TAT 18-24 HRS): SARS-CoV-2, NAA: NOT DETECTED

## 2019-07-05 NOTE — Progress Notes (Signed)
Anesthesia Chart Review   Case: [id number] Date/Time: 07/07/19 0715   Procedures:      CYSTOSCOPY WITH BIOPSY WITH FULGURATION (N/A )     BIOPSY TRANSRECTAL ULTRASONIC PROSTATE (TUBP) (N/A )   Anesthesia type: General   Pre-op diagnosis: BLADDER LESION PROSTATE CANCER   Location: [place] [facility]   Surgeon: Irine Seal, MD      DISCUSSION:68 y.o. never smoker with h/o HTN, HLD, GERD, umbilical hernia, CKD, bladder lesion, prostate cancer scheduled for above procedure 07/07/2019 with Dr. Irine Seal.   Pt last seen by cardiologist, Dr. Quay Burow, 03/22/2019.  Per OV note, "A Myoview stress test was performed was read as high risk with ischemia.  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  If his LV function is normal we will continue to treat him medically.  If his LV function is indeed depressed we will proceed with outpatient cardiac cath."  Echo 03/29/2019, per results, "Normal LV size and function with mild asymmetric hypertrophy, and moderate aortic root dilatation up to 45 mm. This will need to be repeated on an annual basis."  Anticipate pt can proceed with planned procedure barring acute status change.   VS: BP (!) 151/84   Pulse 84   Temp 36.8 C (Oral)   Resp 16   Ht 5\' 10"  (1.778 m)   Wt 112.9 kg   SpO2 98%   BMI 35.73 kg/m   PROVIDERS: Tisovec, Fransico Him, MD is PCP   Quay Burow, MD is Cardiologist  LABS: Labs reviewed: Acceptable for surgery. (all labs ordered are listed, but only abnormal results are displayed)  Labs Reviewed  BASIC METABOLIC PANEL - Abnormal; Notable for the following components:      Result Value   Creatinine, Ser 1.39 (*)    GFR calc non Af Amer 52 (*)    All other components within normal limits  CBC     IMAGES: CT Angio  Chest 01/14/2019 IMPRESSION: No pulmonary emboli or other visible acute chest pathology.  Aortic atherosclerosis.  Coronary artery calcification.  EKG: 01/14/2019 Rate 92 bpm  NSR  CV: Echo 03/29/2019 IMPRESSIONS   1. The left ventricle has normal systolic function, with an ejection fraction of 55%. The cavity size was normal. There is mild asymmetric left ventricular hypertrophy. Left ventricular diastolic Doppler parameters are consistent with impaired  relaxation.  2. The right ventricle has normal systolic function. The cavity was moderately enlarged. There is no increase in right ventricular wall thickness. Right ventricular systolic pressure could not be assessed with an estimated pressure of 24.5 mmHg.  3. There is mild mitral annular calcification present.  4. The aorta is abnormal in size and structure.  5. 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  6. The aortic valve is tricuspid. Mild sclerosis of the aortic valve. Aortic valve regurgitation is trivial by color flow Doppler.  7. Right atrial size was severely dilated.  8. The interatrial septum appears to be lipomatous.  Myocardial Perfusion Imaging 03/11/2019  Nuclear stress EF: 36%.  The left ventricular ejection fraction is moderately decreased (30-44%).  No T wave inversion was noted during stress.  There was no ST segment deviation noted during stress.  Defect 1:  There is a large defect of severe severity.  Findings consistent with ischemia.  This is a high risk study.   Large size, severe intensity reversible (SDS 6) inferior and inferoseptal perfusion defect, suggestive of ischemia. LVEF 36% with inferior and inferoseptal hypokinesis. This is a high risk study. Past Medical History:  Diagnosis Date  . At risk for sleep apnea    STOP-BANG= 4     SENT TO PCP 06-14-2014  . Bladder cancer ([place])   . Blood clot in bladder   . Chronic kidney  disease    membraneous nephropaty  . Coronary artery calcification   . Dyspnea    With excertion  . Elevated prostate specific antigen (PSA)   . Frequency of urination   . GERD (gastroesophageal reflux disease)   . Gross hematuria   . Hematuria   . Hernia, umbilical    small reoccurring hernia at present.  . History of eye injury    hx vitreous tear 2010-right eye- condition is stable 04-02-16  . History of melanoma excision    right foreman  . History of renal vein thrombosis   . History of umbilical hernia [id number]  . Hx: nephrotic syndrome    Dr. [name] follows-will see soon for followup  . Hyperlipidemia   . Hypertension   . Inguinal hernia    surgery corrected.  . Malignant neoplasm of lateral wall of urinary bladder ([place])   . Personal history of urinary disorder   . Prostate cancer ([place])   . Skin cancer   . Syncope, near    03-30-16 recent visit in Hospital([facility])-discharged 04-01-16- related to Blood pressure and medications  . Urgency of urination     Past Surgical History:  Procedure Laterality Date  . BLADDER INSTILLATION     "BCG" x2 tx. bladder cancer  . CHOLECYSTECTOMY  1989  . CYSTOSCOPY WITH BIOPSY N/A 06/15/2014   Procedure: CYSTOSCOPY CLOT EVACUATION ;  Surgeon: Malka So, MD;  [facility]: [facility];  Service: Urology;  Laterality: N/A;  . CYSTOSCOPY/RETROGRADE/URETEROSCOPY N/A 04/03/2016   Procedure: CYSTOSCOPY/BILATERAL RETROGRADE [id number];  Surgeon: Irine Seal, MD;  Location: [facility];  Service: Urology;  Laterality: N/A;  . EYE SURGERY Right    laser   . INGUINAL HERNIA REPAIR Right 05-26-2011  . MELANOMA EXCISION  2010   right forearm  . PROSTATE BIOPSY     x2  . TRANSURETHRAL RESECTION OF BLADDER TUMOR N/A 04/03/2016   Procedure: TRANSURETHRAL RESECTION OF BLADDER TUMOR (TURBT);  Surgeon: Irine Seal, MD;  Location: [facility];  Service: Urology;  Laterality: N/A;  . TRANSURETHRAL RESECTION OF BLADDER TUMOR WITH GYRUS (TURBT-GYRUS)  N/A 06/15/2014   Procedure: TRANSURETHRAL RESECTION OF BLADDER TUMOR WITH GYRUS (TURBT-GYRUS);  Surgeon: Malka So, MD;  Location[facility];  Service: Urology;  Laterality: N/A;    MEDICATIONS: . amLODipine (NORVASC) 5 MG tablet  . aspirin 325 MG tablet  . furosemide (LASIX) 40 MG tablet  . ketoconazole (NIZORAL) 2 % shampoo  . meclizine (ANTIVERT) 25 MG tablet  . metoprolol tartrate (LOPRESSOR) 100 MG tablet  . montelukast (SINGULAIR) 10 MG tablet  . simvastatin (ZOCOR) 40 MG tablet  . sodium bicarbonate 325 MG tablet  . telmisartan (MICARDIS) 80 MG tablet  . vitamin B-12 (CYANOCOBALAMIN) 1000 MCG tablet   No current facility-administered medications for this encounter.       Maia Plan [facility] Pre-Surgical Testing [phone] 07/05/19  1:03 PM

## 2019-07-06 MED ORDER — GENTAMICIN SULFATE 40 MG/ML IJ SOLN
5.0000 mg/kg | INTRAVENOUS | Status: AC
  Administered 2019-07-07: 07:00:00 450 mg via INTRAVENOUS
  Filled 2019-07-06: qty 11.25

## 2019-07-06 NOTE — H&P (Signed)
CC: I have bladder cancer.  HPI: [name] is a 68 year-old male established patient who is here for bladder cancer.  His problem was diagnosed 04/04/2016.   His bladder cancer was treated by removal with scope. Patient denies removal of the entire bladder, radiation, and chemotherapy.   [name] returns today in f/u for cystoscopy for his history of bladder cancer. He has had no gross hematuria or new voiding complaints since his last visit. His IPSS is 6-7. He has a reduced stream and some intermittency. His urgency and frequency have decreased somewhat. His UA has 20-40 RBC's. He had a negative cytology in 8/20 He had maintenance BCG with a single dose on 01/12/19. He was sick for 3-4 weeks after the last dose with chest tightness and soreness. He had a sore throat and itchy eyes. He had a negative Covid test. CT prior in 2/20 showed no upper tract disease.   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 He completed induction BCG on 06/13/16. 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 He had minimal trouble with his first induction course in 2015. He had a prior [name] in 10/15 and had induction BCG but no maintenance because of supply issues.       CC: I have prostate cancer.  HPI: His prostate cancer was diagnosed 01/14/2016. His most recent PSA is 9.34.   He also has low risk prostate cancer that is being managed with AS. He had 30% Gleason 6 in one core on his biopsy in 5/17 and had an MRI in 2016 with no suspicious features. He had a repeat prostate biopsy in 2/19 that showed a single core of Gleason 6(3+3) 5%. His PSA wais back up to 10.3 in 8/20 but has had prior elevations following BCG instillation. He was given Levaquin for 2 weeks and his PSA is back down to 9.34. The prior PSA was 6.34 . His PSA has been elevated from the BCG therapy up to 12.4.      AUA Symptom Score: He never has the sensation of not emptying his bladder completely after  finishing urinating. Less than 20% of the time and Less than 50% of the time he has to urinate again fewer than two hours after he has finished urinating. Less than 20% of the time he has to start and stop again several times when he urinates. Less than 20% of the time he finds it difficult to postpone urination. Less than 20% of the time he has a weak urinary stream. He never has to push or strain to begin urination. He has to get up to urinate 2 times and 3 times from the time he goes to bed until the time he gets up in the morning.   Calculated AUA Symptom Score: 6    ALLERGIES: Doxycycline Succinylcholine Chloride SOLN Vibramycin CAPS    MEDICATIONS: Metoprolol Tartrate 25 mg tablet  Simvastatin 40 mg tablet  Aspirin 325 mg tablet Oral  Lasix  Sodium Bicarbonate  Twynsta 80 mg-5 mg tablet 0 Oral  Veltassa 16.8 gram powder in packet     GU PSH: Bladder Instill AntiCA Agent - 01/12/2019, 07/26/2018, 07/12/2018, 06/28/2018, 01/06/2018, 12/21/2017, 2019, 2018, 2018, 2018, 2017, 2017, 2017, 2017, 2017, 2017, 2017, 2015 Cystoscopy - 04/06/2019, 01/05/2019, 10/13/2018, 03/15/2018, 2019, 2019, 2018, 2018, 2018, 2017, 2017 Cystoscopy Irrigate Clot - 2015 Cystoscopy TURBT <2 cm - 2017 Cystoscopy TURBT 2-5 cm - 2015 Locm 300-399Mg /Ml  Iodine,1Ml - 10/05/2018 Prostate Needle Biopsy - 2019       PSH Notes: Bladder Injection Of Cancer Treatment, Cystoscopy With Fulguration Medium Lesion (2-5cm), Cystourethroscopy With Irrigation And Evacuation Of Clots, Hernia Repair, Destruction Of Malignant Lesion, Cholecystectomy   NON-GU PSH: Cholecystectomy (open) - 2010 Hernia Repair - 2015 Surgical Pathology, Gross And Microscopic Examination For Prostate Needle - 2019     GU PMH: Prostate Cancer, His PSA is up but that is probably from the BCG. He has persistent nodularily as well. I am going to given him Levaquin for 2 weeks and will recheck the PSA in 3 months. - 04/06/2019, (Stable), His PSA is down. Repeat  PSA in 6 months with an exam. , - 10/13/2018, He will need a PSA at f/u in 3 months. , - 06/14/2018, He will have a PSA done today. 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 , - 2019, His PSA is back up to 10.9 and he has a new prostate nodule. 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 , - 2019, Prostate cancer, - 2017 ED due to arterial insufficiency, He hasn't filled the Cialis. - 06/14/2018, He got some benefit from LI-EST but did respond well to sildenafil but had significant side effects. I will try him on tadalafil and he can use 5-20mg  prn. , - 03/15/2018, - 01/14/2018, He has progressive ED that is mild to moderate. I discussed options including weight loss and exercise, meds and FirmEST. He is interested in the FirmEST and will schedule that after completing his maintenance BCG. , - 2019 Prostate nodule w/o LUTS - 2019 Nocturia - 2019 Prostate nodule w/ LUTS - 2019 Bladder Cancer Trigone - 2017, He has a new tumor on the right trigone that is 1cm and appears superficial., - 2017 History of bladder cancer - 2017 Microscopic hematuria - 2017 Bladder Cancer Lateral, Malignant neoplasm of lateral wall of bladder - 2017 Elevated PSA, Elevated prostate specific antigen (PSA) - 2017 Bladder Cancer overlapping sites, Malignant neoplasm of overlapping sites of bladder - 2016 BPH w/o LUTS, Benign prostate hyperplasia - 2016 Personal Hx Oth Urinary System diseases, History of glomerulonephritis - 2015    NON-GU PMH: Encounter for general adult medical examination without abnormal findings, Encounter for preventive health examination - 2016 Pulmonary Embolism, History, History of pulmonary embolism - 2015 Personal history of other diseases of the circulatory system, History of hypertension -  2014 Personal history of other endocrine, nutritional and metabolic disease, History of hypercholesterolemia - 2014 Personal history of other specified conditions, History of heartburn - 2014 Hyperkalemia Other peripheral vertigo, unspecified ear    FAMILY HISTORY: Congestive Heart Failure - Runs In Family Hematuria - Mother Hypertension - Runs In Family, Father, Mother Lung Cancer - Runs In Family malignant neoplasm of breast - Runs In Family systemic lupus erythematosus - Runs In Family   SOCIAL HISTORY: Marital Status: Married Preferred Language: English; Ethnicity: Not Hispanic Or Latino; Race: White Current Smoking Status: Patient has never smoked.  Drinks 1 drink per week.  Drinks 4+ caffeinated drinks per day.     Notes: Occupation, Alcohol Use, Never a smoker, Mother deceased, Marital History - Currently Married, Tobacco Use   REVIEW OF SYSTEMS:    GU Review Male:  Patient denies frequent urination, hard to postpone urination, burning/ pain with urination, get up at night to urinate, leakage of urine, stream starts and stops, trouble starting your stream, have to strain to urinate , erection problems, and penile pain.  Gastrointestinal (Upper):   Patient denies nausea, vomiting, and indigestion/ heartburn.  Gastrointestinal (Lower):   Patient denies diarrhea and constipation.  Constitutional:   Patient denies fever, night sweats, weight loss, and fatigue.  Skin:   Patient denies skin rash/ lesion and itching.  Eyes:   Patient denies blurred vision and double vision.  Ears/ Nose/ Throat:   Patient denies sore throat and sinus problems.  Hematologic/Lymphatic:   Patient denies swollen glands and easy bruising.  Cardiovascular:   Patient denies leg swelling and chest pains.  Respiratory:   Patient denies cough and shortness of breath.  Endocrine:   Patient denies excessive thirst.  Musculoskeletal:   Patient denies back pain and joint pain.  Neurological:   Patient denies  headaches and dizziness.  Psychologic:   Patient denies depression and anxiety.   Notes: He reports his renal function is improving.     VITAL SIGNS:      06/20/2019 08:00 AM  Weight 237 lb / 107.5 kg  Height 71 in / 180.34 cm  BP 137/82 mmHg  Pulse 74 /min  Temperature 97.7 F / 36.5 C  BMI 33.1 kg/m   MULTI-SYSTEM PHYSICAL EXAMINATION:    Constitutional: Well-nourished. No physical deformities. Normally developed. Good grooming.  Respiratory: Normal breath sounds. No labored breathing, no use of accessory muscles.   Cardiovascular: Regular rate and rhythm. No murmur, no gallop.      PAST DATA REVIEWED:  Source Of History:  Patient  Lab Test Review:   PSA, Urine Cytology  Records Review:   AUA Symptom Score  Urine Test Review:   Urinalysis   06/14/19 03/31/19 09/24/18 03/15/18 08/24/17 02/02/17 09/29/16 07/02/16  PSA  Total PSA 9.34 ng/mL 10.30 ng/mL 6.34 ng/mL 8.03 ng/mL 10.90 ng/mL 5.87 ng/mL 12.40 ng/dl 11.70 ng/dl    PROCEDURES:         Flexible Cystoscopy - 52000  Risks, benefits, and some of the potential complications of the procedure were discussed. 38ml of 2% lidocaine jelly was instilled intraurethrally.  Cipro 500mg  given for antibiotic prophylaxis.     Meatus:  Normal size. Normal location. Normal condition.  Urethra:  No strictures.  External Sphincter:  Normal.  Verumontanum:  Normal.  Prostate:  Obstructing. Moderate hyperplasia.  Bladder Neck:  Non-obstructing.  Ureteral Orifices:  Normal location. Normal size. Normal shape. Effluxed clear urine.  Bladder:  Moderate trabeculation. Erythematous mucosa patchy on the posterior wall, floor and right lateral wall that is most consistent with inflammation. No tumors. No stones.      The procedure was well tolerated and there were no complications.         Urinalysis w/Scope Dipstick Dipstick Cont'd Micro  Color: Yellow Bilirubin: Neg mg/dL WBC/hpf: 0 - 5/hpf  Appearance: Clear Ketones: Neg mg/dL  RBC/hpf: 20 - 40/hpf  Specific Gravity: 1.025 Blood: 2+ ery/uL Bacteria: Rare (0-9/hpf)  pH: <=5.0 Protein: 2+ mg/dL Cystals: NS (Not Seen)  Glucose: Neg mg/dL Urobilinogen: 0.2 mg/dL Casts: NS (Not Seen)    Nitrites: Neg Trichomonas: Not Present    Leukocyte Esterase: Neg leu/uL Mucous: Not Present      Epithelial Cells: NS (Not Seen)      Yeast: NS (Not Seen)      Sperm: Not Present    ASSESSMENT:  ICD-10 Details  1 GU:   Prostate Cancer - C61 His PSA is down slightly with levaquin. I am going to have to take him for a bladder biopsy and will consider prostate biopsy at that time. I have reviewed the risks of bleeding, infection and voiding difficulty.   2   History of bladder cancer - Z85.51 He has patchy erythema on the posterior and right lateral wall that is probably inflammatory but a biopsy is indicated.   3   Elevated PSA - R97.20   4   Microscopic hematuria - R31.21 He has persistent hematuria.    PLAN:           Orders Labs Urine Cytology          Schedule Return Visit/Planned Activity: Next Available Appointment - Schedule Surgery  Procedure: Unspecified Date - Cystoscopy Fulguration - [phone]

## 2019-07-06 NOTE — Progress Notes (Signed)
Spoke with patient by phone, patient aware surgery moved to [place] arrive 530 am 07-07-2019, follow all other pre op instructions given

## 2019-07-06 NOTE — Anesthesia Preprocedure Evaluation (Addendum)
Anesthesia Evaluation  Patient identified by MRN, date of birth, ID band Patient awake    Reviewed: Allergy & Precautions, NPO status , Patient's Chart, lab work & pertinent test results  History of Anesthesia Complications Negative for: history of anesthetic complications  Airway Mallampati: II  TM Distance: >3 FB Neck ROM: Full    Dental no notable dental hx.    Pulmonary neg pulmonary ROS,    Pulmonary exam normal        Cardiovascular hypertension, Pt. on medications + CAD  Normal cardiovascular exam  TTE 8/20: EF 55%, mild LVH, impaired relaxation, severe RAE    Neuro/Psych negative neurological ROS  negative psych ROS   GI/Hepatic Neg liver ROS, GERD  ,  Endo/Other  negative endocrine ROS  Renal/GU Renal InsufficiencyRenal disease  negative genitourinary   Musculoskeletal negative musculoskeletal ROS (+)   Abdominal   Peds  Hematology negative hematology ROS (+)   Anesthesia Other Findings Day of surgery medications reviewed with patient.  Reproductive/Obstetrics negative OB ROS                            Anesthesia Physical Anesthesia Plan  ASA: II  Anesthesia Plan: General   Post-op Pain Management:    Induction: Intravenous  PONV Risk Score and Plan: 3 and Treatment may vary due to age or medical condition, Ondansetron, Dexamethasone and Midazolam  Airway Management Planned: LMA  Additional Equipment: None  Intra-op Plan:   Post-operative Plan: Extubation in OR  Informed Consent: I have reviewed the patients History and Physical, chart, labs and discussed the procedure including the risks, benefits and alternatives for the proposed anesthesia with the patient or authorized representative who has indicated his/her understanding and acceptance.     Dental advisory given  Plan Discussed with: CRNA  Anesthesia Plan Comments:        Anesthesia Quick  Evaluation

## 2019-07-07 ENCOUNTER — Ambulatory Visit (HOSPITAL_BASED_OUTPATIENT_CLINIC_OR_DEPARTMENT_OTHER): Payer: BC Managed Care – PPO | Admitting: Physician Assistant

## 2019-07-07 ENCOUNTER — Ambulatory Visit (HOSPITAL_COMMUNITY)
Admission: RE | Admit: 2019-07-07 | Discharge: 2019-07-07 | Disposition: A | Discharge status: Home / Self Care | Payer: BC Managed Care – PPO | Source: Ambulatory Visit | Attending: Urology | Admitting: Urology

## 2019-07-07 ENCOUNTER — Encounter (HOSPITAL_BASED_OUTPATIENT_CLINIC_OR_DEPARTMENT_OTHER)
Admission: RE | Disposition: A | Discharge status: Home / Self Care | Payer: Self-pay | Source: Home / Self Care | Attending: Urology

## 2019-07-07 ENCOUNTER — Encounter (HOSPITAL_BASED_OUTPATIENT_CLINIC_OR_DEPARTMENT_OTHER): Payer: Self-pay | Admitting: *Deleted

## 2019-07-07 ENCOUNTER — Other Ambulatory Visit: Payer: Self-pay

## 2019-07-07 ENCOUNTER — Ambulatory Visit (HOSPITAL_BASED_OUTPATIENT_CLINIC_OR_DEPARTMENT_OTHER): Payer: BC Managed Care – PPO | Admitting: Anesthesiology

## 2019-07-07 ENCOUNTER — Ambulatory Visit (HOSPITAL_BASED_OUTPATIENT_CLINIC_OR_DEPARTMENT_OTHER)
Admission: RE | Admit: 2019-07-07 | Discharge: 2019-07-07 | Disposition: A | Discharge status: Home / Self Care | Payer: BC Managed Care – PPO | Attending: Urology | Admitting: Urology

## 2019-07-07 DIAGNOSIS — I2510 Atherosclerotic heart disease of native coronary artery without angina pectoris: Secondary | ICD-10-CM | POA: Diagnosis not present

## 2019-07-07 DIAGNOSIS — N400 Enlarged prostate without lower urinary tract symptoms: Secondary | ICD-10-CM | POA: Insufficient documentation

## 2019-07-07 DIAGNOSIS — Z86711 Personal history of pulmonary embolism: Secondary | ICD-10-CM | POA: Insufficient documentation

## 2019-07-07 DIAGNOSIS — N3020 Other chronic cystitis without hematuria: Secondary | ICD-10-CM | POA: Diagnosis not present

## 2019-07-07 DIAGNOSIS — Z7982 Long term (current) use of aspirin: Secondary | ICD-10-CM | POA: Diagnosis not present

## 2019-07-07 DIAGNOSIS — Z79899 Other long term (current) drug therapy: Secondary | ICD-10-CM | POA: Diagnosis not present

## 2019-07-07 DIAGNOSIS — I10 Essential (primary) hypertension: Secondary | ICD-10-CM | POA: Insufficient documentation

## 2019-07-07 DIAGNOSIS — N3289 Other specified disorders of bladder: Secondary | ICD-10-CM | POA: Diagnosis not present

## 2019-07-07 DIAGNOSIS — N329 Bladder disorder, unspecified: Secondary | ICD-10-CM | POA: Diagnosis not present

## 2019-07-07 DIAGNOSIS — E7800 Pure hypercholesterolemia, unspecified: Secondary | ICD-10-CM | POA: Diagnosis not present

## 2019-07-07 DIAGNOSIS — C61 Malignant neoplasm of prostate: Secondary | ICD-10-CM

## 2019-07-07 DIAGNOSIS — Z8551 Personal history of malignant neoplasm of bladder: Secondary | ICD-10-CM | POA: Insufficient documentation

## 2019-07-07 HISTORY — PX: CYSTOSCOPY WITH BIOPSY: SHX5122

## 2019-07-07 HISTORY — PX: PROSTATE BIOPSY: SHX241

## 2019-07-07 SURGERY — CYSTOSCOPY, WITH BIOPSY
Anesthesia: General | Site: Prostate

## 2019-07-07 MED ORDER — ROCURONIUM BROMIDE 10 MG/ML (PF) SYRINGE
PREFILLED_SYRINGE | INTRAVENOUS | Status: AC
  Filled 2019-07-07: qty 10

## 2019-07-07 MED ORDER — SODIUM CHLORIDE 0.9 % IV SOLN
250.0000 mL | INTRAVENOUS | Status: DC | PRN
  Filled 2019-07-07: qty 250

## 2019-07-07 MED ORDER — OXYCODONE HCL 5 MG/5ML PO SOLN
5.0000 mg | Freq: Once | ORAL | Status: DC | PRN
  Filled 2019-07-07: qty 5

## 2019-07-07 MED ORDER — ONDANSETRON HCL 4 MG/2ML IJ SOLN
INTRAMUSCULAR | Status: DC | PRN
  Administered 2019-07-07: 4 mg via INTRAVENOUS

## 2019-07-07 MED ORDER — MIDAZOLAM HCL 5 MG/5ML IJ SOLN
INTRAMUSCULAR | Status: DC | PRN
  Administered 2019-07-07: 2 mg via INTRAVENOUS

## 2019-07-07 MED ORDER — FENTANYL CITRATE (PF) 100 MCG/2ML IJ SOLN
INTRAMUSCULAR | Status: DC | PRN
  Administered 2019-07-07 (×2): 50 ug via INTRAVENOUS

## 2019-07-07 MED ORDER — FENTANYL CITRATE (PF) 100 MCG/2ML IJ SOLN
25.0000 ug | INTRAMUSCULAR | Status: DC | PRN
  Filled 2019-07-07: qty 1

## 2019-07-07 MED ORDER — ACETAMINOPHEN 500 MG PO TABS
ORAL_TABLET | ORAL | Status: AC
  Filled 2019-07-07: qty 2

## 2019-07-07 MED ORDER — OXYCODONE HCL 5 MG PO TABS
5.0000 mg | ORAL_TABLET | Freq: Once | ORAL | Status: DC | PRN
  Filled 2019-07-07: qty 1

## 2019-07-07 MED ORDER — DEXAMETHASONE SODIUM PHOSPHATE 10 MG/ML IJ SOLN
INTRAMUSCULAR | Status: AC
  Filled 2019-07-07: qty 1

## 2019-07-07 MED ORDER — ONDANSETRON HCL 4 MG/2ML IJ SOLN
INTRAMUSCULAR | Status: AC
  Filled 2019-07-07: qty 2

## 2019-07-07 MED ORDER — ACETAMINOPHEN 500 MG PO TABS
1000.0000 mg | ORAL_TABLET | Freq: Once | ORAL | Status: AC
  Administered 2019-07-07: 06:00:00 1000 mg via ORAL
  Filled 2019-07-07: qty 2

## 2019-07-07 MED ORDER — DEXAMETHASONE SODIUM PHOSPHATE 10 MG/ML IJ SOLN
INTRAMUSCULAR | Status: DC | PRN
  Administered 2019-07-07: 5 mg via INTRAVENOUS

## 2019-07-07 MED ORDER — SODIUM CHLORIDE 0.9 % IV SOLN
INTRAVENOUS | Status: DC
  Administered 2019-07-07 (×2): via INTRAVENOUS
  Filled 2019-07-07: qty 1000

## 2019-07-07 MED ORDER — MIDAZOLAM HCL 2 MG/2ML IJ SOLN
INTRAMUSCULAR | Status: AC
  Filled 2019-07-07: qty 2

## 2019-07-07 MED ORDER — SODIUM CHLORIDE 0.9 % IV SOLN
2.0000 g | INTRAVENOUS | Status: AC
  Administered 2019-07-07: 08:00:00 2 g via INTRAVENOUS
  Filled 2019-07-07: qty 20

## 2019-07-07 MED ORDER — ROCURONIUM BROMIDE 50 MG/5ML IV SOSY
PREFILLED_SYRINGE | INTRAVENOUS | Status: DC | PRN
  Administered 2019-07-07: 20 mg via INTRAVENOUS

## 2019-07-07 MED ORDER — PHENYLEPHRINE 40 MCG/ML (10ML) SYRINGE FOR IV PUSH (FOR BLOOD PRESSURE SUPPORT)
PREFILLED_SYRINGE | INTRAVENOUS | Status: DC | PRN
  Administered 2019-07-07: 80 ug via INTRAVENOUS

## 2019-07-07 MED ORDER — SODIUM CHLORIDE 0.9 % IV SOLN
INTRAVENOUS | Status: AC
  Filled 2019-07-07: qty 100

## 2019-07-07 MED ORDER — PROMETHAZINE HCL 25 MG/ML IJ SOLN: 6.2500 mg | INTRAMUSCULAR | Status: DC | PRN

## 2019-07-07 MED ORDER — PROPOFOL 500 MG/50ML IV EMUL
INTRAVENOUS | Status: AC
  Filled 2019-07-07: qty 50

## 2019-07-07 MED ORDER — PHENYLEPHRINE 40 MCG/ML (10ML) SYRINGE FOR IV PUSH (FOR BLOOD PRESSURE SUPPORT)
PREFILLED_SYRINGE | INTRAVENOUS | Status: AC
  Filled 2019-07-07: qty 10

## 2019-07-07 MED ORDER — OXYCODONE HCL 5 MG PO TABS
5.0000 mg | ORAL_TABLET | ORAL | Status: DC | PRN
  Filled 2019-07-07: qty 2

## 2019-07-07 MED ORDER — LIDOCAINE 2% (20 MG/ML) 5 ML SYRINGE
INTRAMUSCULAR | Status: DC | PRN
  Administered 2019-07-07: 80 mg via INTRAVENOUS

## 2019-07-07 MED ORDER — SODIUM CHLORIDE 0.9% FLUSH
3.0000 mL | Freq: Two times a day (BID) | INTRAVENOUS | Status: DC
  Filled 2019-07-07: qty 3

## 2019-07-07 MED ORDER — PROPOFOL 10 MG/ML IV BOLUS
INTRAVENOUS | Status: DC | PRN
  Administered 2019-07-07: 200 mg via INTRAVENOUS
  Administered 2019-07-07: 50 mg via INTRAVENOUS
  Administered 2019-07-07: 130 mg via INTRAVENOUS
  Administered 2019-07-07: 50 mg via INTRAVENOUS

## 2019-07-07 MED ORDER — SUCCINYLCHOLINE CHLORIDE 200 MG/10ML IV SOSY
PREFILLED_SYRINGE | INTRAVENOUS | Status: DC | PRN
  Administered 2019-07-07: 120 mg via INTRAVENOUS

## 2019-07-07 MED ORDER — SUCCINYLCHOLINE CHLORIDE 200 MG/10ML IV SOSY
PREFILLED_SYRINGE | INTRAVENOUS | Status: AC
  Filled 2019-07-07: qty 10

## 2019-07-07 MED ORDER — ACETAMINOPHEN 650 MG RE SUPP
650.0000 mg | RECTAL | Status: DC | PRN
  Filled 2019-07-07: qty 1

## 2019-07-07 MED ORDER — MORPHINE SULFATE (PF) 2 MG/ML IV SOLN
2.0000 mg | INTRAVENOUS | Status: DC | PRN
  Filled 2019-07-07: qty 1

## 2019-07-07 MED ORDER — FENTANYL CITRATE (PF) 100 MCG/2ML IJ SOLN
INTRAMUSCULAR | Status: AC
  Filled 2019-07-07: qty 2

## 2019-07-07 MED ORDER — CEFTRIAXONE SODIUM 2 G IJ SOLR
INTRAMUSCULAR | Status: AC
  Filled 2019-07-07: qty 20

## 2019-07-07 MED ORDER — ACETAMINOPHEN 325 MG PO TABS
650.0000 mg | ORAL_TABLET | ORAL | Status: DC | PRN
  Filled 2019-07-07: qty 2

## 2019-07-07 MED ORDER — LIDOCAINE 2% (20 MG/ML) 5 ML SYRINGE
INTRAMUSCULAR | Status: AC
  Filled 2019-07-07: qty 5

## 2019-07-07 MED ORDER — SODIUM CHLORIDE 0.9% FLUSH
3.0000 mL | INTRAVENOUS | Status: DC | PRN
  Filled 2019-07-07: qty 3

## 2019-07-07 SURGICAL SUPPLY — 30 items
BAG DRAIN URO-CYSTO SKYTR STRL (DRAIN) ×4 IMPLANT
BAG DRN UROCATH (DRAIN) ×2
CATH FOLEY 2WAY SLVR  5CC 16FR (CATHETERS)
CATH FOLEY 2WAY SLVR 5CC 16FR (CATHETERS) IMPLANT
CLOTH BEACON ORANGE TIMEOUT ST (SAFETY) ×4 IMPLANT
ELECT REM PT RETURN 9FT ADLT (ELECTROSURGICAL) ×4
ELECTRODE REM PT RTRN 9FT ADLT (ELECTROSURGICAL) ×2 IMPLANT
GLOVE SURG SS PI 8.0 STRL IVOR (GLOVE) ×4 IMPLANT
GOWN STRL REUS W/ TWL LRG LVL3 (GOWN DISPOSABLE) ×2 IMPLANT
GOWN STRL REUS W/ TWL XL LVL3 (GOWN DISPOSABLE) ×2 IMPLANT
GOWN STRL REUS W/TWL LRG LVL3 (GOWN DISPOSABLE) ×4
GOWN STRL REUS W/TWL XL LVL3 (GOWN DISPOSABLE) ×3
INST BIOPSY MAXCORE 18GX25 (NEEDLE) ×4 IMPLANT
INSTR BIOPSY MAXCORE 18GX20 (NEEDLE) IMPLANT
KIT TURNOVER CYSTO (KITS) ×4 IMPLANT
MANIFOLD NEPTUNE II (INSTRUMENTS) ×4 IMPLANT
NDL SAFETY ECLIPSE 18X1.5 (NEEDLE) IMPLANT
NEEDLE HYPO 18GX1.5 SHARP (NEEDLE)
NEEDLE HYPO 22GX1.5 SAFETY (NEEDLE) IMPLANT
NEEDLE SPNL 22GX7 QUINCKE BK (NEEDLE) IMPLANT
NS IRRIG 500ML POUR BTL (IV SOLUTION) IMPLANT
PACK CYSTO (CUSTOM PROCEDURE TRAY) ×4 IMPLANT
SURGILUBE 2OZ TUBE FLIPTOP (MISCELLANEOUS) ×4 IMPLANT
SYR 20ML LL LF (SYRINGE) IMPLANT
SYR CONTROL 10ML LL (SYRINGE) IMPLANT
TOWEL OR 17X26 10 PK STRL BLUE (TOWEL DISPOSABLE) ×4 IMPLANT
TUBE CONNECTING 12'X1/4 (SUCTIONS) ×1
TUBE CONNECTING 12X1/4 (SUCTIONS) ×3 IMPLANT
UNDERPAD 30X30 (UNDERPADS AND DIAPERS) ×4 IMPLANT
WATER STERILE IRR 3000ML UROMA (IV SOLUTION) ×4 IMPLANT

## 2019-07-07 NOTE — Discharge Instructions (Addendum)
CYSTOSCOPY HOME CARE INSTRUCTIONS ° °Activity: °Rest for the remainder of the day.  Do not drive or operate equipment today.  You may resume normal activities in one to two days as instructed by your physician.  ° °Meals: °Drink plenty of liquids and eat light foods such as gelatin or soup this evening.  You may return to a normal meal plan tomorrow. ° °Return to Work: °You may return to work in one to two days or as instructed by your physician. ° °Special Instructions / Symptoms: °Call your physician if any of these symptoms occur: ° ° -persistent or heavy bleeding ° -bleeding which continues after first few urination ° -large blood clots that are difficult to pass ° -urine stream diminishes or stops completely ° -fever equal to or higher than 101 degrees Farenheit. ° -cloudy urine with a strong, foul odor ° -severe pain ° °Females should always wipe from front to back after elimination.  You may feel some burning pain when you urinate.  This should disappear with time.  Applying moist heat to the lower abdomen or a hot tub bath may help relieve the pain. \ ° ° ° ° °Post Anesthesia Home Care Instructions ° °Activity: °Get plenty of rest for the remainder of the day. A responsible adult should stay with you for 24 hours following the procedure.  °For the next 24 hours, DO NOT: °-Drive a car °-Operate machinery °-Drink alcoholic beverages °-Take any medication unless instructed by your physician °-Make any legal decisions or sign important papers. ° °Meals: °Start with liquid foods such as gelatin or soup. Progress to regular foods as tolerated. Avoid greasy, spicy, heavy foods. If nausea and/or vomiting occur, drink only clear liquids until the nausea and/or vomiting subsides. Call your physician if vomiting continues. ° °Special Instructions/Symptoms: °Your throat may feel dry or sore from the anesthesia or the breathing tube placed in your throat during surgery. If this causes discomfort, gargle with warm salt  water. The discomfort should disappear within 24 hours. ° °If you had a scopolamine patch placed behind your ear for the management of post- operative nausea and/or vomiting: ° °1. The medication in the patch is effective for 72 hours, after which it should be removed.  Wrap patch in a tissue and discard in the trash. Wash hands thoroughly with soap and water. °2. You may remove the patch earlier than 72 hours if you experience unpleasant side effects which may include dry mouth, dizziness or visual disturbances. °3. Avoid touching the patch. Wash your hands with soap and water after contact with the patch. °  ° °

## 2019-07-07 NOTE — Op Note (Signed)
Procedure: 1.  Cystoscopy with biopsy and fulguration of a 1cm left posterior and  2cm dome lesions. 2.  Transrectal ultrasound of the prostate. 3.  Ultrasound-guided prostate biopsy.  Preop diagnosis: 1.  History of bladder cancer with bladder wall lesions. 2.  Prostate cancer.  Postop diagnosis: Same.  Surgeon: Dr. Irine Seal.  Anesthesia: General.  Specimen: 1.  Left posterior bladder wall biopsy. 2.  Dome bladder wall biopsy. 3.  12 core prostate biopsy.  EBL: None.  Drain: None.  Complications: None.  Indications: The patient is a 68 year old male with a history of both prostate and bladder cancer.  He is undergone BCG therapy for the bladder cancer after prior resections and is been found to have erythematous lesions on the dome and posterior wall the bladder that are suspicious for chronic inflammatory change versus carcinoma in situ it was felt the biopsies were indicated.  He also has a history of low risk prostate cancer on surveillance and is due for surveillance biopsy.  Procedure: He was given gentamicin per pharmacy and Rocephin 2 g IV preoperatively.  A general anesthetic was induced.  He was placed in lithotomy position and fitted with PAS hose.  His perineum and genitalia were prepped with Betadine solution he was draped in usual sterile fashion.  Cystoscopy was performed using a 23 [place] scope and 30 degree lens.  Examination revealed a normal urethra.  The external sphincter was intact.  The prostatic urethra was approximately 3 to 4 cm in length with bilobar hyperplasia with some coaptation and obstruction.  Examination of bladder demonstrated ureteral orifices in normal anatomic position effluxing clear urine.  The bladder wall had mild trabeculation.  He has increased vascularity of the bladder wall related to prior Cytoxan treatment.  On the left posterior wall and dome there were patchy areas of erythema with the largest being approximately 2 cm on the dome.  A  cup biopsy forceps was used to obtain one biopsy from the posterior wall lesion and 2 from the dome lesion.  The biopsy sites were then generously fulgurated along with any abnormal surrounding mucosa.  Once hemostasis was achieved, the bladder was partially drained and the cystoscope was removed.  The 10 MHz transrectal ultrasound probe was then assembled with a guide and inserted transrectally.  The prostate was then imaged.  The prostate volume is approximately 51 mL.  There was a hypoechoic lesion in the right base medial peripheral zone that could represent either BCG granuloma or possibly neoplasm.  There was a more hyperechoic nodule in the left mid transitional zone.  No other lesions were identified and the seminal vesicles were unremarkable.  The 18-gauge biopsy needle was then used to obtain biopsies in the standard 12 core pattern with care taken to target the hypoechoic lesion in the right base and also the hyperechoic lesion in the left mid transitional zone.  2 cores were obtained from this area to ensure proper sampling.  Once all biopsy cores had been obtained the ultrasound probe was removed.  There was no significant rectal bleeding.  He was then taken down from the lithotomy position, his anesthetic was reversed and he was moved to recovery in stable condition.  There were no complications.

## 2019-07-07 NOTE — Transfer of Care (Signed)
Immediate Anesthesia Transfer of Care Note  Patient: [name]  Procedure(s) Performed: CYSTOSCOPY WITH BIOPSY WITH FULGURATION (N/A Bladder) BIOPSY TRANSRECTAL ULTRASONIC PROSTATE (TUBP) (N/A Prostate)  Patient Location: PACU  Anesthesia Type:General  Level of Consciousness: awake, alert  and oriented  Airway & Oxygen Therapy: Patient Spontanous Breathing and Patient connected to face mask oxygen  Post-op Assessment: Report given to RN and Post -op Vital signs reviewed and stable  Post vital signs: Reviewed and stable  Last Vitals:  Vitals Value Taken Time  BP 124/81 07/07/19 0830  Temp    Pulse 75 07/07/19 0830  Resp 14   SpO2 98 % 07/07/19 0830  Vitals shown include unvalidated device data.  Last Pain:  Vitals:   07/07/19 0631  TempSrc: Oral  PainSc: 0-No pain      Patients Stated Pain Goal: 4 ([id number])  Complications: No apparent anesthesia complications

## 2019-07-07 NOTE — Interval H&P Note (Signed)
No change in history

## 2019-07-07 NOTE — Anesthesia Procedure Notes (Addendum)
Procedure Name: Intubation Date/Time: 07/07/2019 7:46 AM Performed by: Genelle Bal, CRNA Pre-anesthesia Checklist: Patient identified, Emergency Drugs available, Suction available and Patient being monitored Patient Re-evaluated:Patient Re-evaluated prior to induction Oxygen Delivery Method: Circle system utilized Preoxygenation: Pre-oxygenation with 100% oxygen Induction Type: IV induction Ventilation: Mask ventilation without difficulty Laryngoscope Size: Glidescope and 3 Grade View: Grade I Tube type: Oral Tube size: 7.5 mm Number of attempts: 1 Airway Equipment and Method: Stylet and Oral airway Placement Confirmation: ETT inserted through vocal cords under direct vision,  positive ETCO2 and breath sounds checked- equal and bilateral Secured at: 23 cm Tube secured with: Tape Dental Injury: Teeth and Oropharynx as per pre-operative assessment  Comments: DLx1 Mil 2 by CRNA, grade III view. Glide #3 LoPro x1, grade I view, ATOI 7.5 OETT, +BBS/etCo2 confirm.

## 2019-07-07 NOTE — Anesthesia Postprocedure Evaluation (Signed)
Anesthesia Post Note  Patient: [name]  Procedure(s) Performed: CYSTOSCOPY WITH BIOPSY WITH FULGURATION (N/A Bladder) BIOPSY TRANSRECTAL ULTRASONIC PROSTATE (TUBP) (N/A Prostate)     Patient location during evaluation: PACU Anesthesia Type: General Level of consciousness: awake and alert and oriented Pain management: pain level controlled Vital Signs Assessment: post-procedure vital signs reviewed and stable Respiratory status: spontaneous breathing, nonlabored ventilation and respiratory function stable Cardiovascular status: blood pressure returned to baseline Postop Assessment: no apparent nausea or vomiting Anesthetic complications: no    Last Vitals:  Vitals:   07/07/19 0830 07/07/19 0845  BP: 124/81 120/79  Pulse: 75 67  Resp: 14 17  Temp: 36.5 C   SpO2: 98% 100%    Last Pain:  Vitals:   07/07/19 0830  TempSrc:   PainSc: 0-No pain                 Brennan Bailey

## 2019-07-08 ENCOUNTER — Encounter (HOSPITAL_BASED_OUTPATIENT_CLINIC_OR_DEPARTMENT_OTHER): Payer: Self-pay | Admitting: Urology

## 2019-07-10 LAB — SURGICAL PATHOLOGY

## 2019-07-25 DIAGNOSIS — Z8551 Personal history of malignant neoplasm of bladder: Secondary | ICD-10-CM | POA: Diagnosis not present

## 2019-07-25 DIAGNOSIS — C61 Malignant neoplasm of prostate: Secondary | ICD-10-CM | POA: Diagnosis not present

## 2019-07-25 DIAGNOSIS — R311 Benign essential microscopic hematuria: Secondary | ICD-10-CM | POA: Diagnosis not present

## 2019-07-25 DIAGNOSIS — N403 Nodular prostate with lower urinary tract symptoms: Secondary | ICD-10-CM | POA: Diagnosis not present

## 2019-07-25 DIAGNOSIS — R351 Nocturia: Secondary | ICD-10-CM | POA: Diagnosis not present

## 2019-08-03 DIAGNOSIS — N183 Chronic kidney disease, stage 3 unspecified: Secondary | ICD-10-CM | POA: Diagnosis not present

## 2019-08-03 DIAGNOSIS — M109 Gout, unspecified: Secondary | ICD-10-CM | POA: Diagnosis not present

## 2019-08-03 DIAGNOSIS — N2581 Secondary hyperparathyroidism of renal origin: Secondary | ICD-10-CM | POA: Diagnosis not present

## 2019-08-03 DIAGNOSIS — R809 Proteinuria, unspecified: Secondary | ICD-10-CM | POA: Diagnosis not present

## 2019-08-03 DIAGNOSIS — E785 Hyperlipidemia, unspecified: Secondary | ICD-10-CM | POA: Diagnosis not present

## 2019-08-03 DIAGNOSIS — I129 Hypertensive chronic kidney disease with stage 1 through stage 4 chronic kidney disease, or unspecified chronic kidney disease: Secondary | ICD-10-CM | POA: Diagnosis not present

## 2019-08-03 DIAGNOSIS — D631 Anemia in chronic kidney disease: Secondary | ICD-10-CM | POA: Diagnosis not present

## 2019-08-24 DIAGNOSIS — E538 Deficiency of other specified B group vitamins: Secondary | ICD-10-CM | POA: Diagnosis not present

## 2019-10-04 DIAGNOSIS — D485 Neoplasm of uncertain behavior of skin: Secondary | ICD-10-CM | POA: Diagnosis not present

## 2019-10-04 DIAGNOSIS — B079 Viral wart, unspecified: Secondary | ICD-10-CM | POA: Diagnosis not present

## 2019-10-07 ENCOUNTER — Encounter: Payer: Self-pay | Admitting: Cardiovascular Disease

## 2019-10-07 ENCOUNTER — Telehealth (INDEPENDENT_AMBULATORY_CARE_PROVIDER_SITE_OTHER): Payer: BC Managed Care – PPO | Admitting: Cardiovascular Disease

## 2019-10-07 DIAGNOSIS — I10 Essential (primary) hypertension: Secondary | ICD-10-CM | POA: Diagnosis not present

## 2019-10-07 DIAGNOSIS — E782 Mixed hyperlipidemia: Secondary | ICD-10-CM

## 2019-10-07 DIAGNOSIS — I2510 Atherosclerotic heart disease of native coronary artery without angina pectoris: Secondary | ICD-10-CM

## 2019-10-07 DIAGNOSIS — R072 Precordial pain: Secondary | ICD-10-CM

## 2019-10-07 NOTE — Patient Instructions (Signed)
Medication Instructions:  Your physician recommends that you continue on your current medications as directed. Please refer to the Current Medication list given to you today.  If you need a refill on your cardiac medications before your next appointment, please call your pharmacy.   Lab work: NONE  Testing/Procedures: Coronary Calcium Scores  Follow-Up: At [facility], you and your health needs are our priority.  As part of our continuing mission to provide you with exceptional heart care, we have created designated Provider Care Teams.  These Care Teams include your primary Cardiologist (physician) and Advanced Practice Providers (APPs -  Physician Assistants and Nurse Practitioners) who all work together to provide you with the care you need, when you need it. You may see Dr. [name] or one of the following Advanced Practice Providers on your designated Care Team:    Kerin Ransom, PA-C  [place], [place]  Coletta Memos, [place]  Your physician wants you to follow-up in: 1 year with Dr. [name]. You will receive a reminder letter in the mail two months in advance. If you don't receive a letter, please call our office to schedule the follow-up appointment.   Any Other Special Instructions Will Be Listed Below (If Applicable).   Coronary Calcium Scan A coronary calcium scan is an imaging test used to look for deposits of plaque in the inner lining of the blood vessels of the heart (coronary arteries). Plaque is made up of calcium, protein, and fatty substances. These deposits of plaque can partly clog and narrow the coronary arteries without producing any symptoms or warning signs. This puts a person at risk for a heart attack. This test is recommended for people who are at moderate risk for heart disease. The test can find plaque deposits before symptoms develop. Tell a health care provider about:  Any allergies you have.  All medicines you are taking, including vitamins, herbs, eye  drops, creams, and over-the-counter medicines.  Any problems you or family members have had with anesthetic medicines.  Any blood disorders you have.  Any surgeries you have had.  Any medical conditions you have.  Whether you are pregnant or may be pregnant. What are the risks? Generally, this is a safe procedure. However, problems may occur, including:  Harm to a pregnant woman and her unborn baby. This test involves the use of radiation. Radiation exposure can be dangerous to a pregnant woman and her unborn baby. If you are pregnant or think you may be pregnant, you should not have this procedure done.  Slight increase in the risk of cancer. This is because of the radiation involved in the test. What happens before the procedure? Ask your health care provider for any specific instructions on how to prepare for this procedure. You may be asked to avoid products that contain caffeine, tobacco, or nicotine for 4 hours before the procedure. What happens during the procedure?   You will undress and remove any jewelry from your neck or chest.  You will put on a hospital gown.  Sticky electrodes will be placed on your chest. The electrodes will be connected to an electrocardiogram (ECG) machine to record a tracing of the electrical activity of your heart.  You will lie down on a curved bed that is attached to the [place].  You may be given medicine to slow down your heart rate so that clear pictures can be created.  You will be moved into the CT scanner, and the CT scanner will take pictures of  your heart. During this time, you will be asked to lie still and hold your breath for 2-3 seconds at a time while each picture of your heart is being taken. The procedure may vary among health care providers and hospitals. What happens after the procedure?  You can get dressed.  You can return to your normal activities.  It is up to you to get the results of your procedure. Ask your  health care provider, or the department that is doing the procedure, when your results will be ready. Summary  A coronary calcium scan is an imaging test used to look for deposits of plaque in the inner lining of the blood vessels of the heart (coronary arteries). Plaque is made up of calcium, protein, and fatty substances.  Generally, this is a safe procedure. Tell your health care provider if you are pregnant or may be pregnant.  Ask your health care provider for any specific instructions on how to prepare for this procedure.  A CT scanner will take pictures of your heart.  You can return to your normal activities after the scan is done. This information is not intended to replace advice given to you by your health care provider. Make sure you discuss any questions you have with your health care provider. Document Revised: 02/22/2019 Document Reviewed: 02/22/2019 Elsevier Patient Education  [place].

## 2019-10-07 NOTE — Addendum Note (Signed)
Addended by: Cain Sieve on: 10/07/2019 01:47 PM   Modules accepted: Orders

## 2019-10-07 NOTE — Progress Notes (Signed)
Virtual Visit via Video Note   This visit type was conducted due to national recommendations for restrictions regarding the COVID-19 Pandemic (e.g. social distancing) in an effort to limit this patient's exposure and mitigate transmission in our community.  Due to his co-morbid illnesses, this patient is at least at moderate risk for complications without adequate follow up.  This format is felt to be most appropriate for this patient at this time.  All issues noted in this document were discussed and addressed.  A limited physical exam was performed with this format.  Please refer to the patient's chart for his consent to telehealth for [facility].   Date:  10/07/2019   ID:  [name], DOB Oct 24, 1950, MRN [id number]  Patient Location: Home Provider Location: Home  PCP:  Tisovec, Fransico Him, MD  Cardiologist: Dr. Quay Burow Electrophysiologist:  None   Evaluation Performed:  Follow-Up Visit  Chief Complaint: Follow-up coronary calcification, hypertension and hyperlipidemia  History of Present Illness:    [name] is a 69 y.o.   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 He was referred by Dr. Osborne Casco for cardiovascular valuation because of recent chest CT performed 01/14/2019 that showed coronary calcification.  I last saw him in the office 03/22/2019.His risk factors include family history with a father who has had stents, treated hypertension and hyperlipidemia. He is never smoked. He is never had a heart attack or stroke. He denies chest pain but has had some shortness of breath probably related to upper respiratory tract infection currently improved after a prednisone Dosepak.  Because of his coronary calcification I performed a Myoview stress testing 03/11/2019 which was read as high risk with an EF of 36% and inferior ischemia although to my eye this appeared to be more consistent with  diaphragmatic attenuation.  Patient is completely asymptomatic.  Since I saw him approximately 8 months ago he continues to do well.  He exercises frequently without symptoms.  He denies chest pain or shortness of breath.  He does have a history of prostate and bladder cancer both of which are in remission.  The patient does not have symptoms concerning for COVID-19 infection (fever, chills, cough, or new shortness of breath).    Past Medical History:  Diagnosis Date  . At risk for sleep apnea    STOP-BANG= 4     SENT TO PCP 06-14-2014  . Bladder cancer ([place])   . Blood clot in bladder   . Chronic kidney disease    membraneous nephropaty  . Coronary artery calcification   . Dyspnea    With excertion  . Elevated prostate specific antigen (PSA)   . Frequency of urination   . GERD (gastroesophageal reflux disease)   . Gross hematuria   . Hematuria   . Hernia, umbilical    small reoccurring hernia at present.  . History of eye injury    hx vitreous tear 2010-right eye- condition is stable 04-02-16  . History of melanoma excision    right foreman  . History of renal vein thrombosis   . History of umbilical hernia [id number]  . Hx: nephrotic syndrome    Dr. [name] follows-will see soon for followup  . Hyperlipidemia   . Hypertension   . Inguinal hernia    surgery corrected.  . Malignant neoplasm of lateral wall of urinary bladder ([place])   . Personal history of urinary disorder   .  Prostate cancer ([place])   . Skin cancer   . Syncope, near    03-30-16 recent visit in Hospital([facility])-discharged 04-01-16- related to Blood pressure and medications  . Urgency of urination    Past Surgical History:  Procedure Laterality Date  . BLADDER INSTILLATION     "BCG" x2 tx. bladder cancer  . CHOLECYSTECTOMY  1989  . CYSTOSCOPY WITH BIOPSY N/A 06/15/2014   Procedure: CYSTOSCOPY CLOT EVACUATION ;  Surgeon: Malka So, MD;  Location: [facility];  Service: Urology;  Laterality:  N/A;  . CYSTOSCOPY WITH BIOPSY N/A 07/07/2019   Procedure: CYSTOSCOPY WITH BIOPSY WITH FULGURATION;  Surgeon: Irine Seal, MD;  Location[facility];  Service: Urology;  Laterality: N/A;  . CYSTOSCOPY/RETROGRADE/URETEROSCOPY N/A 04/03/2016   Procedure: CYSTOSCOPY/BILATERAL RETROGRADE [id number];  Surgeon: Irine Seal, MD;  Location: [facility];  Service: Urology;  Laterality: N/A;  . EYE SURGERY Right    laser   . INGUINAL HERNIA REPAIR Right 05-26-2011  . MELANOMA EXCISION  2010   right forearm  . PROSTATE BIOPSY     x2  . PROSTATE BIOPSY N/A 07/07/2019   Procedure: BIOPSY TRANSRECTAL ULTRASONIC PROSTATE (TUBP);  Surgeon: Irine Seal, MD;  Location: [facility];  Service: Urology;  Laterality: N/A;  . TRANSURETHRAL RESECTION OF BLADDER TUMOR N/A 04/03/2016   Procedure: TRANSURETHRAL RESECTION OF BLADDER TUMOR (TURBT);  Surgeon: Irine Seal, MD;  Location: [facility];  Service: Urology;  Laterality: N/A;  . TRANSURETHRAL RESECTION OF BLADDER TUMOR WITH GYRUS (TURBT-GYRUS) N/A 06/15/2014   Procedure: TRANSURETHRAL RESECTION OF BLADDER TUMOR WITH GYRUS (TURBT-GYRUS);  Surgeon: Malka So, MD;  Location: [facility];  Service: Urology;  Laterality: N/A;     No outpatient medications have been marked as taking for the 10/07/19 encounter (Telemedicine) with Lorretta Harp, MD.     Allergies:   Vibramycin [doxycycline calcium], Mushroom extract complex, and Succinylcholine chloride   Social History   Tobacco Use  . Smoking status: Never Smoker  . Smokeless tobacco: Never Used  Substance Use Topics  . Alcohol use: Yes    Comment: rare  . Drug use: No     Family Hx: The patient's family history includes Breast cancer in his sister; Heart disease in his mother; Lung cancer in his father; Lupus in his sister.  ROS:   Please see the history of present illness.     All other systems reviewed and are negative.   Prior CV studies:   The following  studies were reviewed today:  None  Labs/Other Tests and Data Reviewed:    EKG:  No ECG reviewed.  Recent Labs: 07/04/2019: BUN 23; Creatinine, Ser 1.39; Hemoglobin 13.4; Platelets 210; Potassium 5.0; Sodium 141   Recent Lipid Panel No results found for: CHOL, TRIG, HDL, CHOLHDL, LDLCALC, LDLDIRECT  Wt Readings from Last 3 Encounters:  10/07/19 236 lb (107 kg)  07/07/19 247 lb 5 oz (112.2 kg)  07/04/19 249 lb (112.9 kg)     Objective:    Vital Signs:  BP 132/82   Pulse 81   Ht 5\' 11"  (1.803 m)   Wt 236 lb (107 kg)   BMI 32.92 kg/m    VITAL SIGNS:  reviewed GEN:  no acute distress RESPIRATORY:  normal respiratory effort, symmetric expansion CARDIOVASCULAR:  no peripheral edema NEURO:  alert and oriented x 3, no obvious focal deficit PSYCH:  normal affect  ASSESSMENT & PLAN:    1. Essential hypertension-history of essential hypertension with  blood pressure measured today by the patient at home of 132/81.  He is on amlodipine and metoprolol. 2. Hyperlipidemia-history of hyperlipidemia on simvastatin followed by his PCP 3. Coronary calcification-history of coronary calcification seen on chest CT incidentally with Myoview stress test performed 03/11/2019 read as high risk however my read was diaphragmatic attenuation.  The ejection fraction on the Myoview was 36 but subsequent echocardiogram showed an EF of 55%.  I am going to get a coronary calcium score to further evaluate  COVID-19 Education: The signs and symptoms of COVID-19 were discussed with the patient and how to seek care for testing (follow up with PCP or arrange E-visit).  The importance of social distancing was discussed today.  Time:   Today, I have spent 11 minutes with the patient with telehealth technology discussing the above problems.     Medication Adjustments/Labs and Tests Ordered: Current medicines are reviewed at length with the patient today.  Concerns regarding medicines are outlined above.    Tests Ordered: No orders of the defined types were placed in this encounter.   Medication Changes: No orders of the defined types were placed in this encounter.   Follow Up:  In Person in 1 year(s)  Signed, Quay Burow, MD  10/07/2019 1:01 PM    [place]

## 2019-10-17 ENCOUNTER — Other Ambulatory Visit: Payer: Self-pay | Admitting: Internal Medicine

## 2019-10-17 DIAGNOSIS — R0609 Other forms of dyspnea: Secondary | ICD-10-CM

## 2019-10-24 DIAGNOSIS — N183 Chronic kidney disease, stage 3 unspecified: Secondary | ICD-10-CM | POA: Diagnosis not present

## 2019-10-26 ENCOUNTER — Ambulatory Visit (INDEPENDENT_AMBULATORY_CARE_PROVIDER_SITE_OTHER)
Admission: RE | Admit: 2019-10-26 | Discharge: 2019-10-26 | Disposition: A | Discharge status: Home / Self Care | Payer: Self-pay | Source: Ambulatory Visit | Attending: Cardiovascular Disease | Admitting: Cardiovascular Disease

## 2019-10-26 ENCOUNTER — Other Ambulatory Visit: Payer: Self-pay

## 2019-10-26 DIAGNOSIS — R072 Precordial pain: Secondary | ICD-10-CM

## 2019-10-26 DIAGNOSIS — I712 Thoracic aortic aneurysm, without rupture: Secondary | ICD-10-CM | POA: Diagnosis not present

## 2019-11-01 ENCOUNTER — Ambulatory Visit: Payer: BC Managed Care – PPO | Admitting: Cardiovascular Disease

## 2019-11-02 ENCOUNTER — Ambulatory Visit: Payer: BC Managed Care – PPO | Admitting: Cardiovascular Disease

## 2019-11-02 ENCOUNTER — Encounter: Payer: Self-pay | Admitting: Cardiovascular Disease

## 2019-11-02 ENCOUNTER — Other Ambulatory Visit: Payer: Self-pay

## 2019-11-02 VITALS — BP 110/72 | HR 77 | Ht 71.0 in | Wt 244.0 lb

## 2019-11-02 DIAGNOSIS — I712 Thoracic aortic aneurysm, without rupture, unspecified: Secondary | ICD-10-CM

## 2019-11-02 DIAGNOSIS — N183 Chronic kidney disease, stage 3 unspecified: Secondary | ICD-10-CM | POA: Diagnosis not present

## 2019-11-02 DIAGNOSIS — I129 Hypertensive chronic kidney disease with stage 1 through stage 4 chronic kidney disease, or unspecified chronic kidney disease: Secondary | ICD-10-CM | POA: Diagnosis not present

## 2019-11-02 DIAGNOSIS — R809 Proteinuria, unspecified: Secondary | ICD-10-CM | POA: Diagnosis not present

## 2019-11-02 DIAGNOSIS — E782 Mixed hyperlipidemia: Secondary | ICD-10-CM | POA: Diagnosis not present

## 2019-11-02 DIAGNOSIS — I2510 Atherosclerotic heart disease of native coronary artery without angina pectoris: Secondary | ICD-10-CM

## 2019-11-02 DIAGNOSIS — D631 Anemia in chronic kidney disease: Secondary | ICD-10-CM | POA: Diagnosis not present

## 2019-11-02 MED ORDER — ROSUVASTATIN CALCIUM 20 MG PO TABS: 20.0000 mg | ORAL_TABLET | Freq: Every day | ORAL | 3 refills | Status: DC

## 2019-11-02 NOTE — Patient Instructions (Signed)
Medication Instructions:  STOP SIMVASTATIN  START ROSUVASTATIN 20 MG ONCE DAILY  *If you need a refill on your cardiac medications before your next appointment, please call your pharmacy*   Lab Work: Your physician recommends that you return for lab work in: 2 [place]  If you have labs (blood work) drawn today and your tests are completely normal, you will receive your results only by: Marland Kitchen MyChart Message (if you have MyChart) OR . A paper copy in the mail If you have any lab test that is abnormal or we need to change your treatment, we will call you to review the results.   Testing/Procedures: CTA OF THE CHEST TO FOLLOW THORACIC AORTIC ANEURYSM IN ONE YEAR   Follow-Up: At [facility], you and your health needs are our priority.  As part of our continuing mission to provide you with exceptional heart care, we have created designated Provider Care Teams.  These Care Teams include your primary Cardiologist (physician) and Advanced Practice Providers (APPs -  Physician Assistants and Nurse Practitioners) who all work together to provide you with the care you need, when you need it.  We recommend signing up for the patient portal called "MyChart".  Sign up information is provided on this After Visit Summary.  MyChart is used to connect with patients for Virtual Visits (Telemedicine).  Patients are able to view lab/test results, encounter notes, upcoming appointments, etc.  Non-urgent messages can be sent to your provider as well.   To learn more about what you can do with MyChart, go to [email or web address].    Your next appointment:   12 month(s)  The format for your next appointment:   Either In Person or Virtual  Provider:   You may see Quay Burow MD or one of the following Advanced Practice Providers on your designated Care Team:    Kerin Ransom, PA-C  [place], [place]  Coletta Memos, [place]

## 2019-11-02 NOTE — Assessment & Plan Note (Signed)
History of thoracic aortic aneurysm seen on 2D echo 03/29/2019 measuring 42 mm and by recent CT measuring 44 mm.  We will repeat at chest CTA in 1 year to follow-up.

## 2019-11-02 NOTE — Assessment & Plan Note (Signed)
History of hyperlipidemia on simvastatin 40 mg a day with lipid liver profile performed 10/26/2019 revealing a total cholesterol of 159, LDL of 83 and HDL 37.  I am going to discontinue his simvastatin, start rosuvastatin 20 mg a day and we will recheck a lipid liver profile in 2 months.  I would prefer to have his LDL to be less than 70.

## 2019-11-02 NOTE — Assessment & Plan Note (Signed)
History of essential hypertension with blood pressure measured today at 110/72.  He is on amlodipine and Micardis.

## 2019-11-02 NOTE — Assessment & Plan Note (Signed)
History of coronary calcification seen on chest CT with a coronary calcium score recently performed 10/26/2019 of 498.  He did have an abnormal Myoview which to my eye did not appear to be high risk but did show a diminished EF however 2D echo performed 03/29/2019 revealed a normal ejection fraction of 55% with mild asymmetric left left left ventricular hypertrophy.  He is asymptomatic.

## 2019-11-02 NOTE — Progress Notes (Signed)
11/02/2019 [name]   04/01/1951  [id number]  Primary Physician Tisovec, Fransico Him, MD Primary Cardiologist: Lorretta Harp MD [name], [place]  HPI:  [name] is a 69 y.o.  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 He was referred by Dr. Osborne Casco for cardiovascular valuation because of recent chest CT performed 01/14/2019 that showed coronary calcification.  I last saw him virtually 10/07/2019.His risk factors include family history with a father who has had stents, treated hypertension and hyperlipidemia. He is never smoked. He is never had a heart attack or stroke. He denies chest pain but has had some shortness of breath probably related to upper respiratory tract infection currently improved after a prednisone Dosepak.  Because of his coronary calcification I performed a Myoview stress testing 03/11/2019 which was read as high risk with an EF of 36% and inferior ischemia although to my eye this appeared to be more consistent with diaphragmatic attenuation.    A 2D echocardiogram performed 03/29/2019 revealed normal LV function but did suggest that he had moderate dilatation of his ascending thoracic aorta.  Patient is completely asymptomatic.  I performed a coronary calcium score on him 10/26/2019 which was 498.  They did comment on a 4.4 cm ascending thoracic aortic aneurysm.  Based on this, I decided to switch his simvastatin to rosuvastatin since a recent lipid profile performed 10/26/2019 revealed an LDL of 83, I prefer to be less than 70.    Current Meds  Medication Sig  . amLODipine (NORVASC) 5 MG tablet Take 5 mg by mouth daily.  Marland Kitchen aspirin 325 MG tablet Take 1 tablet (325 mg total) by mouth daily.  . furosemide (LASIX) 40 MG tablet Take 40 mg by mouth daily.   Marland Kitchen ketoconazole (NIZORAL) 2 % shampoo Apply 1 application topically every 7 (seven) days.  . meclizine (ANTIVERT) 25 MG  tablet Take 1 tablet (25 mg total) by mouth 3 (three) times daily as needed for dizziness.  . montelukast (SINGULAIR) 10 MG tablet Take 10 mg by mouth at bedtime.  . sodium bicarbonate 325 MG tablet Take 975 mg by mouth 2 (two) times daily.  Marland Kitchen telmisartan (MICARDIS) 80 MG tablet Take 80 mg by mouth daily.  . vitamin B-12 (CYANOCOBALAMIN) 1000 MCG tablet Take 1,000 mcg by mouth daily.  . [DISCONTINUED] simvastatin (ZOCOR) 40 MG tablet Take 40 mg by mouth daily.     Allergies  Allergen Reactions  . Vibramycin [Doxycycline Calcium] Hives, Nausea Only and Other (See Comments)    fever  . Mushroom Extract Complex Hives and Nausea And Vomiting  . Succinylcholine Chloride Other (See Comments)    Postoperative myalgia    Social History   Socioeconomic History  . Marital status: Married    Spouse name: Not on file  . Number of children: Not on file  . Years of education: Not on file  . Highest education level: Not on file  Occupational History  . Not on file  Tobacco Use  . Smoking status: Never Smoker  . Smokeless tobacco: Never Used  Substance and Sexual Activity  . Alcohol use: Yes    Comment: rare  . Drug use: No  . Sexual activity: Yes  Other Topics Concern  . Not on file  Social History Narrative  . Not on file   Social Determinants of Health   Financial Resource Strain:   . Difficulty of  Paying Living Expenses:   Food Insecurity:   . Worried About Charity fundraiser in the Last Year:   . Arboriculturist in the Last Year:   Transportation Needs:   . Film/video editor (Medical):   Marland Kitchen Lack of Transportation (Non-Medical):   Physical Activity:   . Days of Exercise per Week:   . Minutes of Exercise per Session:   Stress:   . Feeling of Stress :   Social Connections:   . Frequency of Communication with Friends and Family:   . Frequency of Social Gatherings with Friends and Family:   . Attends Religious Services:   . Active Member of Clubs or Organizations:   .  Attends Archivist Meetings:   Marland Kitchen Marital Status:   Intimate Partner Violence:   . Fear of Current or Ex-Partner:   . Emotionally Abused:   Marland Kitchen Physically Abused:   . Sexually Abused:      Review of Systems: General: negative for chills, fever, night sweats or weight changes.  Cardiovascular: negative for chest pain, dyspnea on exertion, edema, orthopnea, palpitations, paroxysmal nocturnal dyspnea or shortness of breath Dermatological: negative for rash Respiratory: negative for cough or wheezing Urologic: negative for hematuria Abdominal: negative for nausea, vomiting, diarrhea, bright red blood per rectum, melena, or hematemesis Neurologic: negative for visual changes, syncope, or dizziness All other systems reviewed and are otherwise negative except as noted above.    Blood pressure 110/72, pulse 77, height 5\' 11"  (1.803 m), weight 244 lb (110.7 kg), SpO2 98 %.  General appearance: alert and no distress Neck: no adenopathy, no carotid bruit, no JVD, supple, symmetrical, trachea midline and thyroid not enlarged, symmetric, no tenderness/mass/nodules Lungs: clear to auscultation bilaterally Heart: regular rate and rhythm, S1, S2 normal, no murmur, click, rub or gallop Extremities: extremities normal, atraumatic, no cyanosis or edema Pulses: 2+ and symmetric Skin: Skin color, texture, turgor normal. No rashes or lesions Neurologic: Alert and oriented X 3, normal strength and tone. Normal symmetric reflexes. Normal coordination and gait  EKG sinus rhythm at 77 with occasional PVC.  I personally reviewed this EKG.  ASSESSMENT AND PLAN:   HTN (hypertension) History of essential hypertension with blood pressure measured today at 110/72.  He is on amlodipine and Micardis.  Hyperlipidemia History of hyperlipidemia on simvastatin 40 mg a day with lipid liver profile performed 10/26/2019 revealing a total cholesterol of 159, LDL of 83 and HDL 37.  I am going to discontinue his  simvastatin, start rosuvastatin 20 mg a day and we will recheck a lipid liver profile in 2 months.  I would prefer to have his LDL to be less than 70.  Coronary artery calcification seen on CAT scan History of coronary calcification seen on chest CT with a coronary calcium score recently performed 10/26/2019 of 498.  He did have an abnormal Myoview which to my eye did not appear to be high risk but did show a diminished EF however 2D echo performed 03/29/2019 revealed a normal ejection fraction of 55% with mild asymmetric left left left ventricular hypertrophy.  He is asymptomatic.  Thoracic aortic aneurysm (HCC) History of thoracic aortic aneurysm seen on 2D echo 03/29/2019 measuring 42 mm and by recent CT measuring 44 mm.  We will repeat at chest CTA in 1 year to follow-up.      Lorretta Harp MD FACP,FACC,FAHA, [facility] 11/02/2019 11:25 AM

## 2019-11-30 DIAGNOSIS — E538 Deficiency of other specified B group vitamins: Secondary | ICD-10-CM | POA: Diagnosis not present

## 2019-11-30 DIAGNOSIS — E7800 Pure hypercholesterolemia, unspecified: Secondary | ICD-10-CM | POA: Diagnosis not present

## 2019-11-30 DIAGNOSIS — Z125 Encounter for screening for malignant neoplasm of prostate: Secondary | ICD-10-CM | POA: Diagnosis not present

## 2019-11-30 DIAGNOSIS — Z0000 Encounter for general adult medical examination without abnormal findings: Secondary | ICD-10-CM | POA: Diagnosis not present

## 2019-12-07 DIAGNOSIS — Z1212 Encounter for screening for malignant neoplasm of rectum: Secondary | ICD-10-CM | POA: Diagnosis not present

## 2019-12-07 DIAGNOSIS — N1831 Chronic kidney disease, stage 3a: Secondary | ICD-10-CM | POA: Diagnosis not present

## 2019-12-07 DIAGNOSIS — C679 Malignant neoplasm of bladder, unspecified: Secondary | ICD-10-CM | POA: Diagnosis not present

## 2019-12-07 DIAGNOSIS — R82998 Other abnormal findings in urine: Secondary | ICD-10-CM | POA: Diagnosis not present

## 2019-12-07 DIAGNOSIS — Z0000 Encounter for general adult medical examination without abnormal findings: Secondary | ICD-10-CM | POA: Diagnosis not present

## 2019-12-07 DIAGNOSIS — C61 Malignant neoplasm of prostate: Secondary | ICD-10-CM | POA: Diagnosis not present

## 2019-12-07 DIAGNOSIS — N052 Unspecified nephritic syndrome with diffuse membranous glomerulonephritis: Secondary | ICD-10-CM | POA: Diagnosis not present

## 2020-01-23 DIAGNOSIS — C61 Malignant neoplasm of prostate: Secondary | ICD-10-CM | POA: Diagnosis not present

## 2020-01-26 DIAGNOSIS — N183 Chronic kidney disease, stage 3 unspecified: Secondary | ICD-10-CM | POA: Diagnosis not present

## 2020-01-26 DIAGNOSIS — E785 Hyperlipidemia, unspecified: Secondary | ICD-10-CM | POA: Diagnosis not present

## 2020-02-01 DIAGNOSIS — I129 Hypertensive chronic kidney disease with stage 1 through stage 4 chronic kidney disease, or unspecified chronic kidney disease: Secondary | ICD-10-CM | POA: Diagnosis not present

## 2020-02-01 DIAGNOSIS — C61 Malignant neoplasm of prostate: Secondary | ICD-10-CM | POA: Diagnosis not present

## 2020-02-01 DIAGNOSIS — R809 Proteinuria, unspecified: Secondary | ICD-10-CM | POA: Diagnosis not present

## 2020-02-01 DIAGNOSIS — R972 Elevated prostate specific antigen [PSA]: Secondary | ICD-10-CM | POA: Diagnosis not present

## 2020-02-01 DIAGNOSIS — D631 Anemia in chronic kidney disease: Secondary | ICD-10-CM | POA: Diagnosis not present

## 2020-02-01 DIAGNOSIS — N183 Chronic kidney disease, stage 3 unspecified: Secondary | ICD-10-CM | POA: Diagnosis not present

## 2020-02-15 DIAGNOSIS — N183 Chronic kidney disease, stage 3 unspecified: Secondary | ICD-10-CM | POA: Diagnosis not present

## 2020-03-29 ENCOUNTER — Other Ambulatory Visit (HOSPITAL_COMMUNITY): Payer: BC Managed Care – PPO

## 2020-04-05 ENCOUNTER — Ambulatory Visit (HOSPITAL_COMMUNITY): Payer: BC Managed Care – PPO | Attending: Cardiology

## 2020-04-05 ENCOUNTER — Other Ambulatory Visit: Payer: Self-pay

## 2020-04-05 DIAGNOSIS — I77810 Thoracic aortic ectasia: Secondary | ICD-10-CM | POA: Diagnosis not present

## 2020-04-05 LAB — ECHOCARDIOGRAM COMPLETE
Area-P 1/2: 5.54 cm2
S' Lateral: 3.5 cm

## 2020-04-06 ENCOUNTER — Other Ambulatory Visit: Payer: Self-pay

## 2020-04-06 DIAGNOSIS — I77810 Thoracic aortic ectasia: Secondary | ICD-10-CM

## 2020-04-06 DIAGNOSIS — I519 Heart disease, unspecified: Secondary | ICD-10-CM

## 2020-04-09 ENCOUNTER — Other Ambulatory Visit: Payer: Self-pay

## 2020-04-09 ENCOUNTER — Telehealth: Payer: Self-pay | Admitting: Cardiovascular Disease

## 2020-04-09 NOTE — Telephone Encounter (Signed)
error 

## 2020-04-09 NOTE — Progress Notes (Signed)
Order for Echo now expired 04/06/2021

## 2020-04-09 NOTE — Telephone Encounter (Signed)
Can we change the expired date on this patient's echo? He is wanting to schedule for 04/09/20 at 7:30am - thank you!

## 2020-04-10 ENCOUNTER — Ambulatory Visit: Payer: BC Managed Care – PPO

## 2020-04-10 NOTE — Progress Notes (Deleted)
Patient ID: [name]                 DOB: 04-Jan-1951                    MRN: [id number]     HPI: [name] is a 69 y.o. male patient referred to lipid clinic by Dr [name]. PMH is significant for ascending thoraci aortic aneurysm, calcium score of 498,   Current Medications:none  Intolerances:  Simvastatin - lack therapeutic response Rosuvastatin 20mg  - hip and leg pain  LDL goal: <70  Diet:   Exercise:   Family History:   Social History:   Labs:  Past Medical History:  Diagnosis Date  . At risk for sleep apnea    STOP-BANG= 4     SENT TO PCP 06-14-2014  . Bladder cancer ([place])   . Blood clot in bladder   . Chronic kidney disease    membraneous nephropaty  . Coronary artery calcification   . Dyspnea    With excertion  . Elevated prostate specific antigen (PSA)   . Frequency of urination   . GERD (gastroesophageal reflux disease)   . Gross hematuria   . Hematuria   . Hernia, umbilical    small reoccurring hernia at present.  . History of eye injury    hx vitreous tear 2010-right eye- condition is stable 04-02-16  . History of melanoma excision    right foreman  . History of renal vein thrombosis   . History of umbilical hernia 19/37/9024  . Hx: nephrotic syndrome    Dr. [name] follows-will see soon for followup  . Hyperlipidemia   . Hypertension   . Inguinal hernia    surgery corrected.  . 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 Personal history of urinary disorder   . Prostate cancer ([place])   . Skin cancer   . Syncope, near    03-30-16 recent visit in Hospital([facility])-discharged 04-01-16- related to Blood pressure and medications  . Urgency of urination     Current Outpatient Medications on File Prior to Visit  Medication Sig Dispense Refill  . amLODipine (NORVASC) 5 MG tablet Take 5 mg by mouth daily.    Marland Kitchen aspirin 325 MG tablet Take 1 tablet (325 mg total) by mouth daily. 90 tablet 3  . furosemide (LASIX) 40 MG tablet Take  40 mg by mouth daily.     Marland Kitchen ketoconazole (NIZORAL) 2 % shampoo Apply 1 application topically every 7 (seven) days.    . meclizine (ANTIVERT) 25 MG tablet Take 1 tablet (25 mg total) by mouth 3 (three) times daily as needed for dizziness. 30 tablet 0  . montelukast (SINGULAIR) 10 MG tablet Take 10 mg by mouth at bedtime.    . rosuvastatin (CRESTOR) 20 MG tablet Take 1 tablet (20 mg total) by mouth daily. 90 tablet 3  . sodium bicarbonate 325 MG tablet Take 975 mg by mouth 2 (two) times daily.    Marland Kitchen telmisartan (MICARDIS) 80 MG tablet Take 80 mg by mouth daily.    . vitamin B-12 (CYANOCOBALAMIN) 1000 MCG tablet Take 1,000 mcg by mouth daily.     No current facility-administered medications on file prior to visit.    Allergies  Allergen Reactions  . Vibramycin [Doxycycline Calcium] Hives, Nausea Only and Other (See Comments)    fever  . Mushroom Extract Complex Hives and Nausea And Vomiting  . Succinylcholine Chloride Other (See Comments)  Postoperative myalgia    No problem-specific Assessment & Plan notes found for this encounter.    Zalma Channing Rodriguez-Guzman PharmD, BCPS, [place] [address] 04/10/2020 9:26 AM

## 2020-04-11 ENCOUNTER — Other Ambulatory Visit: Payer: Self-pay | Admitting: Cardiovascular Disease

## 2020-04-12 DIAGNOSIS — N1832 Chronic kidney disease, stage 3b: Secondary | ICD-10-CM | POA: Diagnosis not present

## 2020-04-12 DIAGNOSIS — E785 Hyperlipidemia, unspecified: Secondary | ICD-10-CM | POA: Diagnosis not present

## 2020-04-12 DIAGNOSIS — R809 Proteinuria, unspecified: Secondary | ICD-10-CM | POA: Diagnosis not present

## 2020-04-12 DIAGNOSIS — N189 Chronic kidney disease, unspecified: Secondary | ICD-10-CM | POA: Diagnosis not present

## 2020-04-12 NOTE — Telephone Encounter (Signed)
Rx has been sent to the pharmacy electronically. ° °

## 2020-04-18 DIAGNOSIS — I129 Hypertensive chronic kidney disease with stage 1 through stage 4 chronic kidney disease, or unspecified chronic kidney disease: Secondary | ICD-10-CM | POA: Diagnosis not present

## 2020-04-18 DIAGNOSIS — D631 Anemia in chronic kidney disease: Secondary | ICD-10-CM | POA: Diagnosis not present

## 2020-04-18 DIAGNOSIS — N1832 Chronic kidney disease, stage 3b: Secondary | ICD-10-CM | POA: Diagnosis not present

## 2020-04-18 DIAGNOSIS — R809 Proteinuria, unspecified: Secondary | ICD-10-CM | POA: Diagnosis not present

## 2020-04-25 DIAGNOSIS — N1832 Chronic kidney disease, stage 3b: Secondary | ICD-10-CM | POA: Diagnosis not present

## 2020-05-24 DIAGNOSIS — L814 Other melanin hyperpigmentation: Secondary | ICD-10-CM | POA: Diagnosis not present

## 2020-05-24 DIAGNOSIS — L821 Other seborrheic keratosis: Secondary | ICD-10-CM | POA: Diagnosis not present

## 2020-05-24 DIAGNOSIS — D225 Melanocytic nevi of trunk: Secondary | ICD-10-CM | POA: Diagnosis not present

## 2020-05-24 DIAGNOSIS — L281 Prurigo nodularis: Secondary | ICD-10-CM | POA: Diagnosis not present

## 2020-05-24 DIAGNOSIS — D485 Neoplasm of uncertain behavior of skin: Secondary | ICD-10-CM | POA: Diagnosis not present

## 2020-05-24 DIAGNOSIS — Z85820 Personal history of malignant melanoma of skin: Secondary | ICD-10-CM | POA: Diagnosis not present

## 2020-06-13 DIAGNOSIS — E538 Deficiency of other specified B group vitamins: Secondary | ICD-10-CM | POA: Diagnosis not present

## 2020-06-22 DIAGNOSIS — N1831 Chronic kidney disease, stage 3a: Secondary | ICD-10-CM | POA: Diagnosis not present

## 2020-06-22 DIAGNOSIS — C61 Malignant neoplasm of prostate: Secondary | ICD-10-CM | POA: Diagnosis not present

## 2020-07-09 DIAGNOSIS — N1832 Chronic kidney disease, stage 3b: Secondary | ICD-10-CM | POA: Diagnosis not present

## 2020-07-17 DIAGNOSIS — D631 Anemia in chronic kidney disease: Secondary | ICD-10-CM | POA: Diagnosis not present

## 2020-07-17 DIAGNOSIS — N1832 Chronic kidney disease, stage 3b: Secondary | ICD-10-CM | POA: Diagnosis not present

## 2020-07-17 DIAGNOSIS — N2581 Secondary hyperparathyroidism of renal origin: Secondary | ICD-10-CM | POA: Diagnosis not present

## 2020-07-17 DIAGNOSIS — I129 Hypertensive chronic kidney disease with stage 1 through stage 4 chronic kidney disease, or unspecified chronic kidney disease: Secondary | ICD-10-CM | POA: Diagnosis not present

## 2020-07-23 DIAGNOSIS — R351 Nocturia: Secondary | ICD-10-CM | POA: Diagnosis not present

## 2020-07-23 DIAGNOSIS — N403 Nodular prostate with lower urinary tract symptoms: Secondary | ICD-10-CM | POA: Diagnosis not present

## 2020-07-23 DIAGNOSIS — N022 Recurrent and persistent hematuria with diffuse membranous glomerulonephritis: Secondary | ICD-10-CM | POA: Diagnosis not present

## 2020-07-28 DIAGNOSIS — R809 Proteinuria, unspecified: Secondary | ICD-10-CM | POA: Diagnosis not present

## 2020-07-30 DIAGNOSIS — Z8551 Personal history of malignant neoplasm of bladder: Secondary | ICD-10-CM | POA: Diagnosis not present

## 2020-07-30 DIAGNOSIS — R351 Nocturia: Secondary | ICD-10-CM | POA: Diagnosis not present

## 2020-07-30 DIAGNOSIS — C61 Malignant neoplasm of prostate: Secondary | ICD-10-CM | POA: Diagnosis not present

## 2020-07-30 DIAGNOSIS — N403 Nodular prostate with lower urinary tract symptoms: Secondary | ICD-10-CM | POA: Diagnosis not present

## 2020-07-30 DIAGNOSIS — R972 Elevated prostate specific antigen [PSA]: Secondary | ICD-10-CM | POA: Diagnosis not present

## 2020-10-04 ENCOUNTER — Other Ambulatory Visit: Payer: Self-pay | Admitting: Cardiovascular Disease

## 2020-10-04 DIAGNOSIS — E782 Mixed hyperlipidemia: Secondary | ICD-10-CM

## 2020-10-10 DIAGNOSIS — R059 Cough, unspecified: Secondary | ICD-10-CM | POA: Diagnosis not present

## 2020-10-10 DIAGNOSIS — R509 Fever, unspecified: Secondary | ICD-10-CM | POA: Diagnosis not present

## 2020-10-10 DIAGNOSIS — J101 Influenza due to other identified influenza virus with other respiratory manifestations: Secondary | ICD-10-CM | POA: Diagnosis not present

## 2020-10-10 DIAGNOSIS — N1831 Chronic kidney disease, stage 3a: Secondary | ICD-10-CM | POA: Diagnosis not present

## 2020-10-10 DIAGNOSIS — Z0189 Encounter for other specified special examinations: Secondary | ICD-10-CM | POA: Diagnosis not present

## 2020-10-10 DIAGNOSIS — E7800 Pure hypercholesterolemia, unspecified: Secondary | ICD-10-CM | POA: Diagnosis not present

## 2020-10-24 ENCOUNTER — Ambulatory Visit
Admission: RE | Admit: 2020-10-24 | Discharge: 2020-10-24 | Disposition: A | Discharge status: Home / Self Care | Payer: BC Managed Care – PPO | Source: Ambulatory Visit | Attending: Cardiovascular Disease | Admitting: Cardiovascular Disease

## 2020-10-24 DIAGNOSIS — I712 Thoracic aortic aneurysm, without rupture, unspecified: Secondary | ICD-10-CM

## 2020-10-24 MED ORDER — IOPAMIDOL (ISOVUE-370) INJECTION 76%
60.0000 mL | Freq: Once | INTRAVENOUS | Status: AC | PRN
  Administered 2020-10-24: 60 mL via INTRAVENOUS

## 2020-11-02 ENCOUNTER — Ambulatory Visit: Payer: BC Managed Care – PPO | Admitting: Cardiovascular Disease

## 2020-11-12 DIAGNOSIS — N1832 Chronic kidney disease, stage 3b: Secondary | ICD-10-CM | POA: Diagnosis not present

## 2020-11-19 DIAGNOSIS — R809 Proteinuria, unspecified: Secondary | ICD-10-CM | POA: Diagnosis not present

## 2020-11-19 DIAGNOSIS — D631 Anemia in chronic kidney disease: Secondary | ICD-10-CM | POA: Diagnosis not present

## 2020-11-19 DIAGNOSIS — N1832 Chronic kidney disease, stage 3b: Secondary | ICD-10-CM | POA: Diagnosis not present

## 2020-11-19 DIAGNOSIS — I129 Hypertensive chronic kidney disease with stage 1 through stage 4 chronic kidney disease, or unspecified chronic kidney disease: Secondary | ICD-10-CM | POA: Diagnosis not present

## 2020-11-20 ENCOUNTER — Other Ambulatory Visit: Payer: Self-pay

## 2020-11-20 ENCOUNTER — Ambulatory Visit (INDEPENDENT_AMBULATORY_CARE_PROVIDER_SITE_OTHER): Payer: BC Managed Care – PPO | Admitting: Sports Medicine

## 2020-11-20 ENCOUNTER — Ambulatory Visit
Admission: RE | Admit: 2020-11-20 | Discharge: 2020-11-20 | Disposition: A | Discharge status: Home / Self Care | Payer: BC Managed Care – PPO | Source: Ambulatory Visit | Attending: Sports Medicine | Admitting: Sports Medicine

## 2020-11-20 VITALS — BP 120/84 | Ht 71.0 in | Wt 234.0 lb

## 2020-11-20 DIAGNOSIS — M25571 Pain in right ankle and joints of right foot: Secondary | ICD-10-CM

## 2020-11-20 NOTE — Progress Notes (Addendum)
   PCP: Haywood Pao, MD  Subjective:   HPI: Patient is a 70 y.o. adult here for right ankle pain.  [name] reports that he first experienced ankle pain this past Friday night.  He notes that he was sitting in his recliner and experience pain when he went to stand up from his recliner.  He reported a sharp pain on the outside of his right ankle.  He thought this would improve his he tried to walk it off but it did not seem to get any better.  The pain seemed to get significantly worse when he got out of bed the following morning.  He also noted swelling around the outside of his right ankle which seem to migrate to the back of his ankle and then toward the inside of his ankle.  He has been using a compression sleeve at home which is provided a lot of pain relief.  He is able to walk comfortably while using the ankle compression.  He has not had any similar ankle trouble like this in the past.  He plans to fly to [place] at the end of this week for a 3-week work trip.  This will involve him working outside over some rough terrain.  He is not able to take NSAIDs.  Review of Systems:  Per HPI.   [place], medications and smoking status reviewed.      Objective:  Physical Exam:  No flowsheet data found.   Gen: awake, alert, NAD, comfortable in exam room Pulm: breathing unlabored  Foot: Inspection:  No obvious bony deformity. No erythema, or bruising.  Normal arch.  Mild swelling under the medial malleolus. Palpation: No tenderness to palpation ROM: Full  ROM of the ankle. Normal midfoot flexibility mild sensation of fullness with forceful flexion or extension of the ankle. Strength: 5/5 strength ankle in all planes Neurovascular: N/V intact distally in the lower extremity Special tests: Negative anterior drawer. Negative talar tilt. Negative squeeze. normal midfoot flexibility. Normal calcaneal motion with heel raise    Assessment & Plan:  1.  Right ankle pain The differential  includes soft tissue injury, arthritis, OCD.  Low suspicion for fracture at this time.  We will move forward with ankle x-rays to ensure no bony abnormality and assess for evidence of arthritis.  He was encouraged to continue using his ankle sleeve on flat terrain.  We provided a lace up brace for additional support over rough terrain.  If he continues to show good improvement, no additional work-up is needed.  If he has difficulty with mobility during his trip to [place], we can consider further work-up when he returns (assuming that his x-rays have no remarkable findings). -Call patient with x-ray results   Matilde Haymaker, MD PGY-3 11/20/2020 1:31 PM  Patient seen and evaluated with the resident.  I agree with the above plan of care.  X-rays reviewed.  There is no real obvious osteoarthritis or OCD.  Proceed with treatment as above.  Hopefully symptoms will resolve rather quickly.  However, if symptoms persist or worsen, consider MRI to rule out occult OCD.  Follow-up for ongoing or recalcitrant issues.

## 2020-11-20 NOTE — Patient Instructions (Signed)
Ankle pain: I am not positive what has caused her ankle discomfort.  I have a low suspicion that this is related to a fracture or broken bone.  We are going to get an x-ray to see if there is evidence of arthritis and to ensure that there are no serious problems before you go on your big trip to [place].  In the meantime, we will get you fitted for a lace up brace will provide additional support for when you are walking over rough surfaces/terrain.  On flat surfaces, it would be better for you to use the compression sleeve that you already have.  Crutches are not necessary but you can use them if you are having more intense discomfort.  Because you cannot take NSAIDs, I would recommend Tylenol for discomfort.  We will let you know if your x-ray has any remarkable findings.

## 2020-11-23 DIAGNOSIS — R809 Proteinuria, unspecified: Secondary | ICD-10-CM | POA: Diagnosis not present

## 2020-11-25 DIAGNOSIS — R059 Cough, unspecified: Secondary | ICD-10-CM | POA: Diagnosis not present

## 2020-11-25 DIAGNOSIS — R509 Fever, unspecified: Secondary | ICD-10-CM | POA: Diagnosis not present

## 2020-11-25 DIAGNOSIS — Z03818 Encounter for observation for suspected exposure to other biological agents ruled out: Secondary | ICD-10-CM | POA: Diagnosis not present

## 2020-11-25 DIAGNOSIS — J019 Acute sinusitis, unspecified: Secondary | ICD-10-CM | POA: Diagnosis not present

## 2020-11-25 DIAGNOSIS — R0981 Nasal congestion: Secondary | ICD-10-CM | POA: Diagnosis not present

## 2020-12-11 DIAGNOSIS — M109 Gout, unspecified: Secondary | ICD-10-CM | POA: Diagnosis not present

## 2020-12-11 DIAGNOSIS — C61 Malignant neoplasm of prostate: Secondary | ICD-10-CM | POA: Diagnosis not present

## 2020-12-11 DIAGNOSIS — E7800 Pure hypercholesterolemia, unspecified: Secondary | ICD-10-CM | POA: Diagnosis not present

## 2020-12-11 DIAGNOSIS — I129 Hypertensive chronic kidney disease with stage 1 through stage 4 chronic kidney disease, or unspecified chronic kidney disease: Secondary | ICD-10-CM | POA: Diagnosis not present

## 2020-12-13 DIAGNOSIS — Z1331 Encounter for screening for depression: Secondary | ICD-10-CM | POA: Diagnosis not present

## 2020-12-13 DIAGNOSIS — Z1339 Encounter for screening examination for other mental health and behavioral disorders: Secondary | ICD-10-CM | POA: Diagnosis not present

## 2020-12-13 DIAGNOSIS — Z1212 Encounter for screening for malignant neoplasm of rectum: Secondary | ICD-10-CM | POA: Diagnosis not present

## 2020-12-13 DIAGNOSIS — R82998 Other abnormal findings in urine: Secondary | ICD-10-CM | POA: Diagnosis not present

## 2020-12-13 DIAGNOSIS — I129 Hypertensive chronic kidney disease with stage 1 through stage 4 chronic kidney disease, or unspecified chronic kidney disease: Secondary | ICD-10-CM | POA: Diagnosis not present

## 2020-12-13 DIAGNOSIS — Z0000 Encounter for general adult medical examination without abnormal findings: Secondary | ICD-10-CM | POA: Diagnosis not present

## 2020-12-13 DIAGNOSIS — C61 Malignant neoplasm of prostate: Secondary | ICD-10-CM | POA: Diagnosis not present

## 2020-12-18 ENCOUNTER — Encounter: Payer: Self-pay | Admitting: Cardiovascular Disease

## 2020-12-18 ENCOUNTER — Other Ambulatory Visit: Payer: Self-pay

## 2020-12-18 ENCOUNTER — Ambulatory Visit (INDEPENDENT_AMBULATORY_CARE_PROVIDER_SITE_OTHER): Payer: BC Managed Care – PPO | Admitting: Cardiovascular Disease

## 2020-12-18 VITALS — BP 122/74 | HR 76 | Ht 71.0 in | Wt 239.8 lb

## 2020-12-18 DIAGNOSIS — I10 Essential (primary) hypertension: Secondary | ICD-10-CM | POA: Diagnosis not present

## 2020-12-18 DIAGNOSIS — I712 Thoracic aortic aneurysm, without rupture, unspecified: Secondary | ICD-10-CM

## 2020-12-18 DIAGNOSIS — E782 Mixed hyperlipidemia: Secondary | ICD-10-CM

## 2020-12-18 DIAGNOSIS — I2510 Atherosclerotic heart disease of native coronary artery without angina pectoris: Secondary | ICD-10-CM

## 2020-12-18 NOTE — Assessment & Plan Note (Signed)
History of coronary calcification seen on chest CT with coronary calcium score measured 10/26/2019 of 498.  Based on this we placed him on statin drugs resulting in an excellent lipid profile for secondary prevention.

## 2020-12-18 NOTE — Assessment & Plan Note (Signed)
History of essential hypertension a blood pressure measured today at 122/74.  He is on amlodipine and Micardis.

## 2020-12-18 NOTE — Assessment & Plan Note (Signed)
History of hyperlipidemia on statin therapy with lipid profile performed 04/12/2020 revealing total cholesterol 127, LDL 62 and HDL 33.

## 2020-12-18 NOTE — Progress Notes (Signed)
12/18/2020 EDDER Hall   1951-03-27  [id number]  Primary Physician Hall, Kyle Him, MD Primary Cardiologist: Kyle Harp MD [name], [place]  HPI:  [name] is a 70 y.o.  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 He was referred by Dr. Osborne Hall for cardiovascular valuation because of recent chest CT performed 01/14/2019 that showed coronary calcification.I last saw Hall  in the office 11/02/2019. His risk factors include family history with a father who has had stents, treated hypertension and hyperlipidemia. He is never smoked. He is never had a heart attack or stroke. He denies chest pain but has had some shortness of breath probably related to upper respiratory tract infection currently improved after a prednisone Dosepak.  Because of his coronary calcification I performed a Myoview stress testing 03/11/2019 which was read as high risk with an EF of 36% and inferior ischemia although to my eye this appeared to be more consistent with diaphragmatic attenuation.   A 2D echocardiogram performed 03/29/2019 revealed normal LV function but did suggest that he had moderate dilatation of his ascending thoracic aorta.  Patient is completely asymptomatic.  I performed a coronary calcium score on Hall 10/26/2019 which was 498.  They did comment on a 4.4 cm ascending thoracic aortic aneurysm.  Based on this, I decided to switch his simvastatin to rosuvastatin since a recent lipid profile performed 10/26/2019 revealed an LDL of 83, I preferred it to be less than 70.  Since I saw Hall a year ago he continues to do well.  He recently spent a month in [place].  He denies chest pain or shortness of breath.  His most recent lipid profile performed 04/12/2020 revealed an LDL of 62, markedly improved on rosuvastatin.  Recent recent chest CTA performed 10/24/2020 revealed a ascending thoracic aortic dimension of 44  mm which has remained stable.   Current Meds  Medication Sig  . amLODipine (NORVASC) 5 MG tablet Take 5 mg by mouth daily.  . [employer] ASPIRIN EC 325 MG EC tablet TAKE 1 TABLET BY MOUTH EVERY DAY  . dapagliflozin propanediol (FARXIGA) 5 MG TABS tablet   . fluticasone (FLONASE) 50 MCG/ACT nasal spray Place 2 sprays into both nostrils daily.  . furosemide (LASIX) 40 MG tablet Take 40 mg by mouth daily.   . rosuvastatin (CRESTOR) 20 MG tablet TAKE 1 TABLET BY MOUTH EVERY DAY  . sodium bicarbonate 325 MG tablet Take 975 mg by mouth 2 (two) times daily.  Marland Kitchen telmisartan (MICARDIS) 80 MG tablet Take 80 mg by mouth daily.  . vitamin B-12 (CYANOCOBALAMIN) 1000 MCG tablet Take 1,000 mcg by mouth daily.     Allergies  Allergen Reactions  . Vibramycin [Doxycycline Calcium] Hives, Nausea Only and Other (See Comments)    fever  . Mushroom Extract Complex Hives and Nausea And Vomiting  . Succinylcholine Chloride Other (See Comments)    Postoperative myalgia    Social History   Socioeconomic History  . Marital status: Married    Spouse name: Not on file  . Number of children: Not on file  . Years of education: Not on file  . Highest education level: Not on file  Occupational History  . Not on file  Tobacco Use  . Smoking status: Never Smoker  . Smokeless tobacco: Never Used  Substance and Sexual Activity  . Alcohol use: Yes    Comment: rare  .  Drug use: No  . Sexual activity: Yes  Other Topics Concern  . Not on file  Social History Narrative  . Not on file   Social Determinants of Health   Financial Resource Strain: Not on file  Food Insecurity: Not on file  Transportation Needs: Not on file  Physical Activity: Not on file  Stress: Not on file  Social Connections: Not on file  Intimate Partner Violence: Not on file     Review of Systems: General: negative for chills, fever, night sweats or weight changes.  Cardiovascular: negative for chest pain, dyspnea on exertion, edema,  orthopnea, palpitations, paroxysmal nocturnal dyspnea or shortness of breath Dermatological: negative for rash Respiratory: negative for cough or wheezing Urologic: negative for hematuria Abdominal: negative for nausea, vomiting, diarrhea, bright red blood per rectum, melena, or hematemesis Neurologic: negative for visual changes, syncope, or dizziness All other systems reviewed and are otherwise negative except as noted above.    Blood pressure 122/74, pulse 76, height 5\' 11"  (1.803 m), weight 239 lb 12.8 oz (108.8 kg).  General appearance: alert and no distress Neck: no adenopathy, no carotid bruit, no JVD, supple, symmetrical, trachea midline and thyroid not enlarged, symmetric, no tenderness/mass/nodules Lungs: clear to auscultation bilaterally Heart: regular rate and rhythm, S1, S2 normal, no murmur, click, rub or gallop Extremities: extremities normal, atraumatic, no cyanosis or edema Pulses: 2+ and symmetric Skin: Skin color, texture, turgor normal. No rashes or lesions Neurologic: Alert and oriented X 3, normal strength and tone. Normal symmetric reflexes. Normal coordination and gait  EKG sinus rhythm at 76 with reverse R wave progression.  I personally reviewed this EKG.  ASSESSMENT AND PLAN:   HTN (hypertension) History of essential hypertension a blood pressure measured today at 122/74.  He is on amlodipine and Micardis.  Hyperlipidemia History of hyperlipidemia on statin therapy with lipid profile performed 04/12/2020 revealing total cholesterol 127, LDL 62 and HDL 33.  Coronary artery calcification seen on CAT scan History of coronary calcification seen on chest CT with coronary calcium score measured 10/26/2019 of 498.  Based on this we placed Hall on statin drugs resulting in an excellent lipid profile for secondary prevention.  Thoracic aortic aneurysm [name]) History of thoracic aortic aneurysm measuring 44 mm by CTA recently performed 10/19/2020.  This will be repeated  on an annual basis.      Kyle Harp MD FACP,FACC,FAHA, [name] 12/18/2020 10:56 AM

## 2020-12-18 NOTE — Assessment & Plan Note (Signed)
History of thoracic aortic aneurysm measuring 44 mm by CTA recently performed 10/19/2020.  This will be repeated on an annual basis.

## 2020-12-18 NOTE — Patient Instructions (Signed)
Medication Instructions:  No Changes In Medications at this time.  *If you need a refill on your cardiac medications before your next appointment, please call your pharmacy*  Testing/Procedures: CTA CHEST/AORTA IN ONE YEAR- FOLLOW UP AFTERWARD  Follow-Up: At [facility], you and your health needs are our priority.  As part of our continuing mission to provide you with exceptional heart care, we have created designated Provider Care Teams.  These Care Teams include your primary Cardiologist (physician) and Advanced Practice Providers (APPs -  Physician Assistants and Nurse Practitioners) who all work together to provide you with the care you need, when you need it.  Your next appointment:   1 year(s)  The format for your next appointment:   In Person  Provider:   Quay Burow, MD

## 2021-01-03 ENCOUNTER — Other Ambulatory Visit: Payer: Self-pay | Admitting: Cardiovascular Disease

## 2021-01-16 DIAGNOSIS — N1832 Chronic kidney disease, stage 3b: Secondary | ICD-10-CM | POA: Diagnosis not present

## 2021-01-16 DIAGNOSIS — R809 Proteinuria, unspecified: Secondary | ICD-10-CM | POA: Diagnosis not present

## 2021-01-22 DIAGNOSIS — N1832 Chronic kidney disease, stage 3b: Secondary | ICD-10-CM | POA: Diagnosis not present

## 2021-01-22 DIAGNOSIS — I129 Hypertensive chronic kidney disease with stage 1 through stage 4 chronic kidney disease, or unspecified chronic kidney disease: Secondary | ICD-10-CM | POA: Diagnosis not present

## 2021-01-22 DIAGNOSIS — R809 Proteinuria, unspecified: Secondary | ICD-10-CM | POA: Diagnosis not present

## 2021-01-22 DIAGNOSIS — D631 Anemia in chronic kidney disease: Secondary | ICD-10-CM | POA: Diagnosis not present

## 2021-02-19 DIAGNOSIS — N401 Enlarged prostate with lower urinary tract symptoms: Secondary | ICD-10-CM | POA: Diagnosis not present

## 2021-02-19 DIAGNOSIS — R351 Nocturia: Secondary | ICD-10-CM | POA: Diagnosis not present

## 2021-02-25 DIAGNOSIS — R351 Nocturia: Secondary | ICD-10-CM | POA: Diagnosis not present

## 2021-02-25 DIAGNOSIS — C61 Malignant neoplasm of prostate: Secondary | ICD-10-CM | POA: Diagnosis not present

## 2021-02-25 DIAGNOSIS — R3121 Asymptomatic microscopic hematuria: Secondary | ICD-10-CM | POA: Diagnosis not present

## 2021-02-25 DIAGNOSIS — R972 Elevated prostate specific antigen [PSA]: Secondary | ICD-10-CM | POA: Diagnosis not present

## 2021-03-14 DIAGNOSIS — E785 Hyperlipidemia, unspecified: Secondary | ICD-10-CM | POA: Diagnosis not present

## 2021-03-14 DIAGNOSIS — N1832 Chronic kidney disease, stage 3b: Secondary | ICD-10-CM | POA: Diagnosis not present

## 2021-03-14 DIAGNOSIS — N189 Chronic kidney disease, unspecified: Secondary | ICD-10-CM | POA: Diagnosis not present

## 2021-03-26 DIAGNOSIS — I129 Hypertensive chronic kidney disease with stage 1 through stage 4 chronic kidney disease, or unspecified chronic kidney disease: Secondary | ICD-10-CM | POA: Diagnosis not present

## 2021-03-26 DIAGNOSIS — D631 Anemia in chronic kidney disease: Secondary | ICD-10-CM | POA: Diagnosis not present

## 2021-03-26 DIAGNOSIS — N1832 Chronic kidney disease, stage 3b: Secondary | ICD-10-CM | POA: Diagnosis not present

## 2021-03-26 DIAGNOSIS — N2581 Secondary hyperparathyroidism of renal origin: Secondary | ICD-10-CM | POA: Diagnosis not present

## 2021-03-27 DIAGNOSIS — Z8371 Family history of colonic polyps: Secondary | ICD-10-CM | POA: Diagnosis not present

## 2021-03-27 DIAGNOSIS — Z86010 Personal history of colonic polyps: Secondary | ICD-10-CM | POA: Diagnosis not present

## 2021-04-01 DIAGNOSIS — C61 Malignant neoplasm of prostate: Secondary | ICD-10-CM | POA: Diagnosis not present

## 2021-04-01 DIAGNOSIS — N403 Nodular prostate with lower urinary tract symptoms: Secondary | ICD-10-CM | POA: Diagnosis not present

## 2021-04-01 DIAGNOSIS — R972 Elevated prostate specific antigen [PSA]: Secondary | ICD-10-CM | POA: Diagnosis not present

## 2021-04-01 DIAGNOSIS — R351 Nocturia: Secondary | ICD-10-CM | POA: Diagnosis not present

## 2021-04-03 ENCOUNTER — Other Ambulatory Visit: Payer: Self-pay | Admitting: Urology

## 2021-04-03 DIAGNOSIS — R972 Elevated prostate specific antigen [PSA]: Secondary | ICD-10-CM

## 2021-04-03 DIAGNOSIS — N403 Nodular prostate with lower urinary tract symptoms: Secondary | ICD-10-CM

## 2021-04-03 DIAGNOSIS — C61 Malignant neoplasm of prostate: Secondary | ICD-10-CM

## 2021-04-05 ENCOUNTER — Other Ambulatory Visit: Payer: Self-pay

## 2021-04-05 ENCOUNTER — Ambulatory Visit (HOSPITAL_COMMUNITY): Payer: BC Managed Care – PPO | Attending: Internal Medicine

## 2021-04-05 DIAGNOSIS — I1310 Hypertensive heart and chronic kidney disease without heart failure, with stage 1 through stage 4 chronic kidney disease, or unspecified chronic kidney disease: Secondary | ICD-10-CM | POA: Diagnosis not present

## 2021-04-05 DIAGNOSIS — R0600 Dyspnea, unspecified: Secondary | ICD-10-CM | POA: Insufficient documentation

## 2021-04-05 DIAGNOSIS — I77810 Thoracic aortic ectasia: Secondary | ICD-10-CM

## 2021-04-05 DIAGNOSIS — G4730 Sleep apnea, unspecified: Secondary | ICD-10-CM | POA: Insufficient documentation

## 2021-04-05 DIAGNOSIS — Z8249 Family history of ischemic heart disease and other diseases of the circulatory system: Secondary | ICD-10-CM | POA: Diagnosis not present

## 2021-04-05 DIAGNOSIS — Z8551 Personal history of malignant neoplasm of bladder: Secondary | ICD-10-CM | POA: Insufficient documentation

## 2021-04-05 DIAGNOSIS — I519 Heart disease, unspecified: Secondary | ICD-10-CM | POA: Diagnosis not present

## 2021-04-05 DIAGNOSIS — I2510 Atherosclerotic heart disease of native coronary artery without angina pectoris: Secondary | ICD-10-CM | POA: Diagnosis not present

## 2021-04-05 DIAGNOSIS — E785 Hyperlipidemia, unspecified: Secondary | ICD-10-CM | POA: Diagnosis not present

## 2021-04-05 DIAGNOSIS — N189 Chronic kidney disease, unspecified: Secondary | ICD-10-CM | POA: Diagnosis not present

## 2021-04-05 LAB — ECHOCARDIOGRAM COMPLETE
Area-P 1/2: 2.23 cm2
S' Lateral: 2.7 cm

## 2021-04-09 ENCOUNTER — Other Ambulatory Visit: Payer: Self-pay | Admitting: Urology

## 2021-04-09 DIAGNOSIS — Z77018 Contact with and (suspected) exposure to other hazardous metals: Secondary | ICD-10-CM

## 2021-04-11 ENCOUNTER — Other Ambulatory Visit: Payer: Self-pay | Admitting: *Deleted

## 2021-04-11 DIAGNOSIS — I712 Thoracic aortic aneurysm, without rupture, unspecified: Secondary | ICD-10-CM

## 2021-04-17 ENCOUNTER — Other Ambulatory Visit: Payer: Self-pay

## 2021-04-17 ENCOUNTER — Ambulatory Visit
Admission: RE | Admit: 2021-04-17 | Discharge: 2021-04-17 | Disposition: A | Discharge status: Home / Self Care | Payer: BC Managed Care – PPO | Source: Ambulatory Visit | Attending: Urology | Admitting: Urology

## 2021-04-17 DIAGNOSIS — Z77018 Contact with and (suspected) exposure to other hazardous metals: Secondary | ICD-10-CM

## 2021-04-24 ENCOUNTER — Other Ambulatory Visit: Payer: Self-pay

## 2021-04-24 ENCOUNTER — Ambulatory Visit
Admission: RE | Admit: 2021-04-24 | Discharge: 2021-04-24 | Disposition: A | Discharge status: Home / Self Care | Payer: BC Managed Care – PPO | Source: Ambulatory Visit | Attending: Urology | Admitting: Urology

## 2021-04-24 DIAGNOSIS — N402 Nodular prostate without lower urinary tract symptoms: Secondary | ICD-10-CM | POA: Diagnosis not present

## 2021-04-24 DIAGNOSIS — R972 Elevated prostate specific antigen [PSA]: Secondary | ICD-10-CM

## 2021-04-24 DIAGNOSIS — N403 Nodular prostate with lower urinary tract symptoms: Secondary | ICD-10-CM

## 2021-04-24 DIAGNOSIS — C61 Malignant neoplasm of prostate: Secondary | ICD-10-CM

## 2021-04-24 MED ORDER — GADOBENATE DIMEGLUMINE 529 MG/ML IV SOLN
20.0000 mL | Freq: Once | INTRAVENOUS | Status: AC | PRN
  Administered 2021-04-24: 20 mL via INTRAVENOUS

## 2021-05-25 DIAGNOSIS — Z23 Encounter for immunization: Secondary | ICD-10-CM | POA: Diagnosis not present

## 2021-05-30 DIAGNOSIS — L821 Other seborrheic keratosis: Secondary | ICD-10-CM | POA: Diagnosis not present

## 2021-05-30 DIAGNOSIS — L814 Other melanin hyperpigmentation: Secondary | ICD-10-CM | POA: Diagnosis not present

## 2021-05-30 DIAGNOSIS — D225 Melanocytic nevi of trunk: Secondary | ICD-10-CM | POA: Diagnosis not present

## 2021-05-30 DIAGNOSIS — Z85820 Personal history of malignant melanoma of skin: Secondary | ICD-10-CM | POA: Diagnosis not present

## 2021-05-30 DIAGNOSIS — L570 Actinic keratosis: Secondary | ICD-10-CM | POA: Diagnosis not present

## 2021-06-04 DIAGNOSIS — N1832 Chronic kidney disease, stage 3b: Secondary | ICD-10-CM | POA: Diagnosis not present

## 2021-06-05 DIAGNOSIS — C61 Malignant neoplasm of prostate: Secondary | ICD-10-CM | POA: Diagnosis not present

## 2021-06-05 DIAGNOSIS — N4289 Other specified disorders of prostate: Secondary | ICD-10-CM | POA: Diagnosis not present

## 2021-06-12 DIAGNOSIS — I129 Hypertensive chronic kidney disease with stage 1 through stage 4 chronic kidney disease, or unspecified chronic kidney disease: Secondary | ICD-10-CM | POA: Diagnosis not present

## 2021-06-12 DIAGNOSIS — N2581 Secondary hyperparathyroidism of renal origin: Secondary | ICD-10-CM | POA: Diagnosis not present

## 2021-06-12 DIAGNOSIS — N1832 Chronic kidney disease, stage 3b: Secondary | ICD-10-CM | POA: Diagnosis not present

## 2021-06-12 DIAGNOSIS — D631 Anemia in chronic kidney disease: Secondary | ICD-10-CM | POA: Diagnosis not present

## 2021-06-18 DIAGNOSIS — I129 Hypertensive chronic kidney disease with stage 1 through stage 4 chronic kidney disease, or unspecified chronic kidney disease: Secondary | ICD-10-CM | POA: Diagnosis not present

## 2021-08-20 DIAGNOSIS — C61 Malignant neoplasm of prostate: Secondary | ICD-10-CM | POA: Diagnosis not present

## 2021-08-22 ENCOUNTER — Other Ambulatory Visit: Payer: Self-pay | Admitting: Cardiovascular Disease

## 2021-08-22 DIAGNOSIS — E782 Mixed hyperlipidemia: Secondary | ICD-10-CM

## 2021-08-26 DIAGNOSIS — N1832 Chronic kidney disease, stage 3b: Secondary | ICD-10-CM | POA: Diagnosis not present

## 2021-08-28 DIAGNOSIS — R351 Nocturia: Secondary | ICD-10-CM | POA: Diagnosis not present

## 2021-08-28 DIAGNOSIS — N403 Nodular prostate with lower urinary tract symptoms: Secondary | ICD-10-CM | POA: Diagnosis not present

## 2021-08-28 DIAGNOSIS — Z8551 Personal history of malignant neoplasm of bladder: Secondary | ICD-10-CM | POA: Diagnosis not present

## 2021-08-28 DIAGNOSIS — R3912 Poor urinary stream: Secondary | ICD-10-CM | POA: Diagnosis not present

## 2021-08-28 DIAGNOSIS — C61 Malignant neoplasm of prostate: Secondary | ICD-10-CM | POA: Diagnosis not present

## 2021-08-30 DIAGNOSIS — I129 Hypertensive chronic kidney disease with stage 1 through stage 4 chronic kidney disease, or unspecified chronic kidney disease: Secondary | ICD-10-CM | POA: Diagnosis not present

## 2021-08-30 DIAGNOSIS — N1832 Chronic kidney disease, stage 3b: Secondary | ICD-10-CM | POA: Diagnosis not present

## 2021-08-30 DIAGNOSIS — N2581 Secondary hyperparathyroidism of renal origin: Secondary | ICD-10-CM | POA: Diagnosis not present

## 2021-08-30 DIAGNOSIS — D631 Anemia in chronic kidney disease: Secondary | ICD-10-CM | POA: Diagnosis not present

## 2021-09-10 DIAGNOSIS — N1832 Chronic kidney disease, stage 3b: Secondary | ICD-10-CM | POA: Diagnosis not present

## 2021-10-21 ENCOUNTER — Other Ambulatory Visit: Payer: Self-pay

## 2021-10-21 DIAGNOSIS — I77810 Thoracic aortic ectasia: Secondary | ICD-10-CM

## 2021-10-21 NOTE — Progress Notes (Signed)
Received a call from our operator states that CT department call and pt is having a CT 10-16-21 and will be coming in today for BMET. ? ?Order entered ?

## 2021-10-25 ENCOUNTER — Other Ambulatory Visit: Payer: Self-pay

## 2021-10-25 DIAGNOSIS — I77810 Thoracic aortic ectasia: Secondary | ICD-10-CM | POA: Diagnosis not present

## 2021-10-26 LAB — BASIC METABOLIC PANEL
BUN/Creatinine Ratio: 11 (ref 10–24)
BUN: 20 mg/dL (ref 8–27)
CO2: 23 mmol/L (ref 20–29)
Calcium: 9.9 mg/dL (ref 8.6–10.2)
Chloride: 106 mmol/L (ref 96–106)
Creatinine, Ser: 1.77 mg/dL — ABNORMAL HIGH (ref 0.76–1.27)
Glucose: 99 mg/dL (ref 70–99)
Potassium: 4.6 mmol/L (ref 3.5–5.2)
Sodium: 142 mmol/L (ref 134–144)
eGFR: 41 mL/min/{1.73_m2} — ABNORMAL LOW (ref >59)

## 2021-10-30 ENCOUNTER — Other Ambulatory Visit: Payer: Self-pay

## 2021-10-30 ENCOUNTER — Ambulatory Visit (INDEPENDENT_AMBULATORY_CARE_PROVIDER_SITE_OTHER)
Admission: RE | Admit: 2021-10-30 | Discharge: 2021-10-30 | Disposition: A | Discharge status: Home / Self Care | Payer: BC Managed Care – PPO | Source: Ambulatory Visit | Attending: Cardiovascular Disease | Admitting: Cardiovascular Disease

## 2021-10-30 DIAGNOSIS — I700 Atherosclerosis of aorta: Secondary | ICD-10-CM | POA: Diagnosis not present

## 2021-10-30 DIAGNOSIS — I712 Thoracic aortic aneurysm, without rupture, unspecified: Secondary | ICD-10-CM | POA: Diagnosis not present

## 2021-10-30 DIAGNOSIS — I7121 Aneurysm of the ascending aorta, without rupture: Secondary | ICD-10-CM | POA: Diagnosis not present

## 2021-10-30 DIAGNOSIS — I2510 Atherosclerotic heart disease of native coronary artery without angina pectoris: Secondary | ICD-10-CM | POA: Diagnosis not present

## 2021-10-30 MED ORDER — IOHEXOL 350 MG/ML SOLN
80.0000 mL | Freq: Once | INTRAVENOUS | Status: AC | PRN
  Administered 2021-10-30: 80 mL via INTRAVENOUS

## 2021-11-12 DIAGNOSIS — N1832 Chronic kidney disease, stage 3b: Secondary | ICD-10-CM | POA: Diagnosis not present

## 2021-11-18 ENCOUNTER — Encounter: Payer: Self-pay | Admitting: Cardiovascular Disease

## 2021-11-18 DIAGNOSIS — D631 Anemia in chronic kidney disease: Secondary | ICD-10-CM | POA: Diagnosis not present

## 2021-11-18 DIAGNOSIS — N1832 Chronic kidney disease, stage 3b: Secondary | ICD-10-CM | POA: Diagnosis not present

## 2021-11-18 DIAGNOSIS — I129 Hypertensive chronic kidney disease with stage 1 through stage 4 chronic kidney disease, or unspecified chronic kidney disease: Secondary | ICD-10-CM | POA: Diagnosis not present

## 2021-11-18 DIAGNOSIS — N2581 Secondary hyperparathyroidism of renal origin: Secondary | ICD-10-CM | POA: Diagnosis not present

## 2021-11-25 DIAGNOSIS — C61 Malignant neoplasm of prostate: Secondary | ICD-10-CM | POA: Diagnosis not present

## 2021-12-02 DIAGNOSIS — C61 Malignant neoplasm of prostate: Secondary | ICD-10-CM | POA: Diagnosis not present

## 2021-12-02 DIAGNOSIS — N281 Cyst of kidney, acquired: Secondary | ICD-10-CM | POA: Diagnosis not present

## 2021-12-04 ENCOUNTER — Other Ambulatory Visit: Payer: Self-pay | Admitting: Urology

## 2021-12-04 DIAGNOSIS — N281 Cyst of kidney, acquired: Secondary | ICD-10-CM

## 2021-12-19 DIAGNOSIS — Z125 Encounter for screening for malignant neoplasm of prostate: Secondary | ICD-10-CM | POA: Diagnosis not present

## 2021-12-19 DIAGNOSIS — M109 Gout, unspecified: Secondary | ICD-10-CM | POA: Diagnosis not present

## 2021-12-19 DIAGNOSIS — I2510 Atherosclerotic heart disease of native coronary artery without angina pectoris: Secondary | ICD-10-CM | POA: Diagnosis not present

## 2021-12-19 DIAGNOSIS — E538 Deficiency of other specified B group vitamins: Secondary | ICD-10-CM | POA: Diagnosis not present

## 2021-12-26 DIAGNOSIS — Z0000 Encounter for general adult medical examination without abnormal findings: Secondary | ICD-10-CM | POA: Diagnosis not present

## 2021-12-26 DIAGNOSIS — Z1331 Encounter for screening for depression: Secondary | ICD-10-CM | POA: Diagnosis not present

## 2021-12-26 DIAGNOSIS — Z1339 Encounter for screening examination for other mental health and behavioral disorders: Secondary | ICD-10-CM | POA: Diagnosis not present

## 2021-12-26 DIAGNOSIS — C61 Malignant neoplasm of prostate: Secondary | ICD-10-CM | POA: Diagnosis not present

## 2022-01-01 ENCOUNTER — Ambulatory Visit
Admission: RE | Admit: 2022-01-01 | Discharge: 2022-01-01 | Disposition: A | Discharge status: Home / Self Care | Payer: BC Managed Care – PPO | Source: Ambulatory Visit | Attending: Urology | Admitting: Urology

## 2022-01-01 DIAGNOSIS — K449 Diaphragmatic hernia without obstruction or gangrene: Secondary | ICD-10-CM | POA: Diagnosis not present

## 2022-01-01 DIAGNOSIS — N281 Cyst of kidney, acquired: Secondary | ICD-10-CM | POA: Diagnosis not present

## 2022-01-01 DIAGNOSIS — K760 Fatty (change of) liver, not elsewhere classified: Secondary | ICD-10-CM | POA: Diagnosis not present

## 2022-01-01 DIAGNOSIS — Q631 Lobulated, fused and horseshoe kidney: Secondary | ICD-10-CM | POA: Diagnosis not present

## 2022-01-01 MED ORDER — GADOBENATE DIMEGLUMINE 529 MG/ML IV SOLN
20.0000 mL | Freq: Once | INTRAVENOUS | Status: AC | PRN
  Administered 2022-01-01: 20 mL via INTRAVENOUS

## 2022-01-23 ENCOUNTER — Other Ambulatory Visit: Payer: Self-pay | Admitting: Cardiovascular Disease

## 2022-02-17 ENCOUNTER — Ambulatory Visit (HOSPITAL_COMMUNITY): Payer: BC Managed Care – PPO | Attending: Cardiology

## 2022-02-17 DIAGNOSIS — I2510 Atherosclerotic heart disease of native coronary artery without angina pectoris: Secondary | ICD-10-CM | POA: Insufficient documentation

## 2022-02-17 DIAGNOSIS — E785 Hyperlipidemia, unspecified: Secondary | ICD-10-CM | POA: Insufficient documentation

## 2022-02-17 DIAGNOSIS — I7121 Aneurysm of the ascending aorta, without rupture: Secondary | ICD-10-CM | POA: Diagnosis not present

## 2022-02-17 DIAGNOSIS — I351 Nonrheumatic aortic (valve) insufficiency: Secondary | ICD-10-CM | POA: Insufficient documentation

## 2022-02-17 DIAGNOSIS — I712 Thoracic aortic aneurysm, without rupture, unspecified: Secondary | ICD-10-CM | POA: Diagnosis not present

## 2022-02-17 LAB — ECHOCARDIOGRAM COMPLETE
Area-P 1/2: 3.27 cm2
P 1/2 time: 617 msec
S' Lateral: 3.3 cm

## 2022-03-25 DIAGNOSIS — N1832 Chronic kidney disease, stage 3b: Secondary | ICD-10-CM | POA: Diagnosis not present

## 2022-04-01 DIAGNOSIS — R809 Proteinuria, unspecified: Secondary | ICD-10-CM | POA: Diagnosis not present

## 2022-04-01 DIAGNOSIS — N1832 Chronic kidney disease, stage 3b: Secondary | ICD-10-CM | POA: Diagnosis not present

## 2022-04-01 DIAGNOSIS — I129 Hypertensive chronic kidney disease with stage 1 through stage 4 chronic kidney disease, or unspecified chronic kidney disease: Secondary | ICD-10-CM | POA: Diagnosis not present

## 2022-04-01 DIAGNOSIS — D631 Anemia in chronic kidney disease: Secondary | ICD-10-CM | POA: Diagnosis not present

## 2022-04-08 ENCOUNTER — Telehealth: Payer: Self-pay | Admitting: *Deleted

## 2022-04-08 NOTE — Chronic Care Management (AMB) (Signed)
  Care Coordination  Outreach Note  04/08/2022 Name: ODAS OZER MRN: [id number] DOB: 11-06-1950   Care Coordination Outreach Attempts  An unsuccessful telephone outreach was attempted today to offer the patient information about available care coordination services as a benefit of their health plan.   Follow Up Plan:  Additional outreach attempts will be made to offer the patient care coordination information and services.   Encounter Outcome:  No Answer   [place]  Direct Dial: [phone]

## 2022-04-09 NOTE — Chronic Care Management (AMB) (Signed)
  Care Coordination  Outreach Note  04/09/2022 Name: [name] MRN: [id number] DOB: 02-Sep-1950   Care Coordination Outreach Attempts  A second unsuccessful outreach was attempted today to offer the patient with information about available care coordination services as a benefit of their health plan.     Follow Up Plan:  Additional outreach attempts will be made to offer the patient care coordination information and services.   Encounter Outcome:  No Answer  [place]  Direct Dial: [phone]

## 2022-04-14 NOTE — Chronic Care Management (AMB) (Signed)
  Care Coordination   Note   04/14/2022 Name: [name] MRN: [id number] DOB: November 06, 1950  [name] is a 71 y.o. year old adult who sees Tisovec, Fransico Him, MD for primary care. I reached out to [name] by phone today to offer care coordination services.  [name] was given information about Care Coordination services today including:   The Care Coordination services include support from the care team which includes your Nurse Coordinator, Clinical Social Worker, or Pharmacist.  The Care Coordination team is here to help remove barriers to the health concerns and goals most important to you. Care Coordination services are voluntary, and the patient may decline or stop services at any time by request to their care team member.   Care Coordination Consent Status: Patient agreed to services and verbal consent obtained.   Follow up plan:  Telephone appointment with care coordination team member scheduled for:  04/16/22  Encounter Outcome:  Pt. Scheduled  [place]  Direct Dial: [phone]

## 2022-04-16 ENCOUNTER — Ambulatory Visit: Payer: Self-pay

## 2022-04-16 NOTE — Patient Outreach (Signed)
  Care Coordination   04/16/2022 Name: [name] MRN: [id number] DOB: 09-21-1950   Care Coordination Outreach Attempts:  An unsuccessful telephone outreach was attempted for a scheduled appointment today.  Follow Up Plan:  Additional outreach attempts will be made to offer the patient care coordination information and services.   Encounter Outcome:  No Answer  Care Coordination Interventions Activated:  No   Care Coordination Interventions:  No, not indicated    Barb Merino, RN, BSN, CCM Care Management Coordinator [facility] Care Management Direct Phone: [phone]

## 2022-04-30 NOTE — Progress Notes (Signed)
Rescheduled 05/06/22  [place]  Direct Dial: [phone]

## 2022-05-06 ENCOUNTER — Ambulatory Visit: Payer: Self-pay

## 2022-05-06 NOTE — Patient Outreach (Signed)
  Care Coordination   Initial Visit Note   05/06/2022 Name: [name] MRN: [id number] DOB: 26-Dec-1950  [name] is a 71 y.o. year old adult who sees Tisovec, Fransico Him, MD for primary care. I spoke with  [name] by phone today.  What matters to the patients health and wellness today?  Patient is interested in receiving a call back but is unable to speak today due to being out of town for work.    Goals Addressed             This Visit's Progress    Care Coordination Activities - further follow up needed       Care Coordination Interventions: Determine patient is interested in Care Coordination services but requested a call back due to being out of town for work Rescheduled call per patients request            SDOH assessments and interventions completed:  No     Care Coordination Interventions Activated:  Yes  Care Coordination Interventions:  Yes, provided   Follow up plan: Follow up call scheduled for 05/13/22 '@11'$ :15 AM     Encounter Outcome:  Pt. Visit Completed

## 2022-05-12 DIAGNOSIS — C61 Malignant neoplasm of prostate: Secondary | ICD-10-CM | POA: Diagnosis not present

## 2022-05-13 ENCOUNTER — Ambulatory Visit: Payer: Self-pay

## 2022-05-13 NOTE — Patient Outreach (Signed)
  Care Coordination   05/13/2022 Name: [name] MRN: [id number] DOB: 1950/10/31   Care Coordination Outreach Attempts:  An unsuccessful telephone outreach was attempted for a scheduled appointment today.  Follow Up Plan:  No further outreach attempts will be made at this time. We have been unable to contact the patient to offer or enroll patient in care coordination services  Encounter Outcome:  No Answer  Care Coordination Interventions Activated:  No   Care Coordination Interventions:  No, not indicated    Barb Merino, RN, BSN, CCM Care Management Coordinator [place] [facility]  Direct Phone: [phone]

## 2022-05-26 ENCOUNTER — Other Ambulatory Visit: Payer: Self-pay | Admitting: Cardiovascular Disease

## 2022-05-26 DIAGNOSIS — E782 Mixed hyperlipidemia: Secondary | ICD-10-CM

## 2022-06-12 DIAGNOSIS — N1832 Chronic kidney disease, stage 3b: Secondary | ICD-10-CM | POA: Diagnosis not present

## 2022-06-13 DIAGNOSIS — Z23 Encounter for immunization: Secondary | ICD-10-CM | POA: Diagnosis not present

## 2022-06-13 DIAGNOSIS — I129 Hypertensive chronic kidney disease with stage 1 through stage 4 chronic kidney disease, or unspecified chronic kidney disease: Secondary | ICD-10-CM | POA: Diagnosis not present

## 2022-06-13 DIAGNOSIS — C61 Malignant neoplasm of prostate: Secondary | ICD-10-CM | POA: Diagnosis not present

## 2022-06-16 DIAGNOSIS — L570 Actinic keratosis: Secondary | ICD-10-CM | POA: Diagnosis not present

## 2022-06-16 DIAGNOSIS — D225 Melanocytic nevi of trunk: Secondary | ICD-10-CM | POA: Diagnosis not present

## 2022-06-16 DIAGNOSIS — L578 Other skin changes due to chronic exposure to nonionizing radiation: Secondary | ICD-10-CM | POA: Diagnosis not present

## 2022-06-16 DIAGNOSIS — L814 Other melanin hyperpigmentation: Secondary | ICD-10-CM | POA: Diagnosis not present

## 2022-06-16 DIAGNOSIS — L821 Other seborrheic keratosis: Secondary | ICD-10-CM | POA: Diagnosis not present

## 2022-06-18 DIAGNOSIS — D631 Anemia in chronic kidney disease: Secondary | ICD-10-CM | POA: Diagnosis not present

## 2022-06-18 DIAGNOSIS — R809 Proteinuria, unspecified: Secondary | ICD-10-CM | POA: Diagnosis not present

## 2022-06-18 DIAGNOSIS — N1832 Chronic kidney disease, stage 3b: Secondary | ICD-10-CM | POA: Diagnosis not present

## 2022-06-18 DIAGNOSIS — I129 Hypertensive chronic kidney disease with stage 1 through stage 4 chronic kidney disease, or unspecified chronic kidney disease: Secondary | ICD-10-CM | POA: Diagnosis not present

## 2022-09-16 DIAGNOSIS — N189 Chronic kidney disease, unspecified: Secondary | ICD-10-CM | POA: Diagnosis not present

## 2022-09-16 DIAGNOSIS — C61 Malignant neoplasm of prostate: Secondary | ICD-10-CM | POA: Diagnosis not present

## 2022-09-16 DIAGNOSIS — N1832 Chronic kidney disease, stage 3b: Secondary | ICD-10-CM | POA: Diagnosis not present

## 2022-09-23 DIAGNOSIS — D631 Anemia in chronic kidney disease: Secondary | ICD-10-CM | POA: Diagnosis not present

## 2022-09-23 DIAGNOSIS — I129 Hypertensive chronic kidney disease with stage 1 through stage 4 chronic kidney disease, or unspecified chronic kidney disease: Secondary | ICD-10-CM | POA: Diagnosis not present

## 2022-09-23 DIAGNOSIS — N2581 Secondary hyperparathyroidism of renal origin: Secondary | ICD-10-CM | POA: Diagnosis not present

## 2022-09-23 DIAGNOSIS — R809 Proteinuria, unspecified: Secondary | ICD-10-CM | POA: Diagnosis not present

## 2022-09-23 DIAGNOSIS — N1832 Chronic kidney disease, stage 3b: Secondary | ICD-10-CM | POA: Diagnosis not present

## 2022-10-19 ENCOUNTER — Other Ambulatory Visit: Payer: Self-pay | Admitting: Cardiovascular Disease

## 2022-10-21 NOTE — Telephone Encounter (Signed)
Aspirin usually is in the prescription.  It is an over-the-counter medication.  And we usually would recommend 81 mg 325 mg.  No benefit from 1 over the other.  However the higher dose makes you more likely to bleed.  Glenetta Hew, MD

## 2022-10-22 DIAGNOSIS — C61 Malignant neoplasm of prostate: Secondary | ICD-10-CM | POA: Diagnosis not present

## 2022-12-08 DIAGNOSIS — C61 Malignant neoplasm of prostate: Secondary | ICD-10-CM | POA: Diagnosis not present

## 2022-12-10 ENCOUNTER — Other Ambulatory Visit: Payer: Self-pay | Admitting: Urology

## 2022-12-10 DIAGNOSIS — C61 Malignant neoplasm of prostate: Secondary | ICD-10-CM

## 2022-12-10 DIAGNOSIS — R972 Elevated prostate specific antigen [PSA]: Secondary | ICD-10-CM

## 2022-12-22 DIAGNOSIS — R5383 Other fatigue: Secondary | ICD-10-CM | POA: Diagnosis not present

## 2022-12-22 DIAGNOSIS — J309 Allergic rhinitis, unspecified: Secondary | ICD-10-CM | POA: Diagnosis not present

## 2022-12-22 DIAGNOSIS — B349 Viral infection, unspecified: Secondary | ICD-10-CM | POA: Diagnosis not present

## 2022-12-22 DIAGNOSIS — R0981 Nasal congestion: Secondary | ICD-10-CM | POA: Diagnosis not present

## 2022-12-22 DIAGNOSIS — Z1152 Encounter for screening for COVID-19: Secondary | ICD-10-CM | POA: Diagnosis not present

## 2022-12-22 DIAGNOSIS — H8110 Benign paroxysmal vertigo, unspecified ear: Secondary | ICD-10-CM | POA: Diagnosis not present

## 2023-01-07 DIAGNOSIS — E7800 Pure hypercholesterolemia, unspecified: Secondary | ICD-10-CM | POA: Diagnosis not present

## 2023-01-07 DIAGNOSIS — Z0000 Encounter for general adult medical examination without abnormal findings: Secondary | ICD-10-CM | POA: Diagnosis not present

## 2023-01-07 DIAGNOSIS — Z1212 Encounter for screening for malignant neoplasm of rectum: Secondary | ICD-10-CM | POA: Diagnosis not present

## 2023-01-12 DIAGNOSIS — Z1212 Encounter for screening for malignant neoplasm of rectum: Secondary | ICD-10-CM | POA: Diagnosis not present

## 2023-01-12 DIAGNOSIS — Z0000 Encounter for general adult medical examination without abnormal findings: Secondary | ICD-10-CM | POA: Diagnosis not present

## 2023-01-14 DIAGNOSIS — R82998 Other abnormal findings in urine: Secondary | ICD-10-CM | POA: Diagnosis not present

## 2023-01-14 DIAGNOSIS — C679 Malignant neoplasm of bladder, unspecified: Secondary | ICD-10-CM | POA: Diagnosis not present

## 2023-01-14 DIAGNOSIS — M109 Gout, unspecified: Secondary | ICD-10-CM | POA: Diagnosis not present

## 2023-01-14 DIAGNOSIS — E669 Obesity, unspecified: Secondary | ICD-10-CM | POA: Diagnosis not present

## 2023-01-14 DIAGNOSIS — N2581 Secondary hyperparathyroidism of renal origin: Secondary | ICD-10-CM | POA: Diagnosis not present

## 2023-01-14 DIAGNOSIS — I129 Hypertensive chronic kidney disease with stage 1 through stage 4 chronic kidney disease, or unspecified chronic kidney disease: Secondary | ICD-10-CM | POA: Diagnosis not present

## 2023-01-14 DIAGNOSIS — K219 Gastro-esophageal reflux disease without esophagitis: Secondary | ICD-10-CM | POA: Diagnosis not present

## 2023-01-14 DIAGNOSIS — C61 Malignant neoplasm of prostate: Secondary | ICD-10-CM | POA: Diagnosis not present

## 2023-01-14 DIAGNOSIS — I2510 Atherosclerotic heart disease of native coronary artery without angina pectoris: Secondary | ICD-10-CM | POA: Diagnosis not present

## 2023-01-14 DIAGNOSIS — N052 Unspecified nephritic syndrome with diffuse membranous glomerulonephritis: Secondary | ICD-10-CM | POA: Diagnosis not present

## 2023-01-14 DIAGNOSIS — Z0000 Encounter for general adult medical examination without abnormal findings: Secondary | ICD-10-CM | POA: Diagnosis not present

## 2023-01-14 DIAGNOSIS — E7800 Pure hypercholesterolemia, unspecified: Secondary | ICD-10-CM | POA: Diagnosis not present

## 2023-01-14 DIAGNOSIS — N1832 Chronic kidney disease, stage 3b: Secondary | ICD-10-CM | POA: Diagnosis not present

## 2023-01-22 ENCOUNTER — Ambulatory Visit
Admission: RE | Admit: 2023-01-22 | Discharge: 2023-01-22 | Disposition: A | Discharge status: Home / Self Care | Payer: PPO | Source: Ambulatory Visit | Attending: Urology | Admitting: Urology

## 2023-01-22 DIAGNOSIS — C61 Malignant neoplasm of prostate: Secondary | ICD-10-CM

## 2023-01-22 DIAGNOSIS — R972 Elevated prostate specific antigen [PSA]: Secondary | ICD-10-CM

## 2023-01-22 MED ORDER — GADOPICLENOL 0.5 MMOL/ML IV SOLN
10.0000 mL | Freq: Once | INTRAVENOUS | Status: AC | PRN
  Administered 2023-01-22: 10 mL via INTRAVENOUS

## 2023-02-18 DIAGNOSIS — N1832 Chronic kidney disease, stage 3b: Secondary | ICD-10-CM | POA: Diagnosis not present

## 2023-02-26 DIAGNOSIS — D631 Anemia in chronic kidney disease: Secondary | ICD-10-CM | POA: Diagnosis not present

## 2023-02-26 DIAGNOSIS — N2581 Secondary hyperparathyroidism of renal origin: Secondary | ICD-10-CM | POA: Diagnosis not present

## 2023-02-26 DIAGNOSIS — I129 Hypertensive chronic kidney disease with stage 1 through stage 4 chronic kidney disease, or unspecified chronic kidney disease: Secondary | ICD-10-CM | POA: Diagnosis not present

## 2023-02-26 DIAGNOSIS — N1832 Chronic kidney disease, stage 3b: Secondary | ICD-10-CM | POA: Diagnosis not present

## 2023-02-26 DIAGNOSIS — R809 Proteinuria, unspecified: Secondary | ICD-10-CM | POA: Diagnosis not present

## 2023-03-18 DIAGNOSIS — C61 Malignant neoplasm of prostate: Secondary | ICD-10-CM | POA: Diagnosis not present

## 2023-03-18 DIAGNOSIS — R972 Elevated prostate specific antigen [PSA]: Secondary | ICD-10-CM | POA: Diagnosis not present

## 2023-03-18 DIAGNOSIS — N411 Chronic prostatitis: Secondary | ICD-10-CM | POA: Diagnosis not present

## 2023-03-18 DIAGNOSIS — N4289 Other specified disorders of prostate: Secondary | ICD-10-CM | POA: Diagnosis not present

## 2023-05-25 DIAGNOSIS — L821 Other seborrheic keratosis: Secondary | ICD-10-CM | POA: Diagnosis not present

## 2023-05-25 DIAGNOSIS — L814 Other melanin hyperpigmentation: Secondary | ICD-10-CM | POA: Diagnosis not present

## 2023-05-25 DIAGNOSIS — D225 Melanocytic nevi of trunk: Secondary | ICD-10-CM | POA: Diagnosis not present

## 2023-05-25 DIAGNOSIS — L570 Actinic keratosis: Secondary | ICD-10-CM | POA: Diagnosis not present

## 2023-05-25 DIAGNOSIS — L578 Other skin changes due to chronic exposure to nonionizing radiation: Secondary | ICD-10-CM | POA: Diagnosis not present

## 2023-05-25 DIAGNOSIS — Z85820 Personal history of malignant melanoma of skin: Secondary | ICD-10-CM | POA: Diagnosis not present

## 2023-06-04 DIAGNOSIS — Z23 Encounter for immunization: Secondary | ICD-10-CM | POA: Diagnosis not present

## 2023-06-16 DIAGNOSIS — N1832 Chronic kidney disease, stage 3b: Secondary | ICD-10-CM | POA: Diagnosis not present

## 2023-06-19 DIAGNOSIS — C61 Malignant neoplasm of prostate: Secondary | ICD-10-CM | POA: Diagnosis not present

## 2023-06-19 DIAGNOSIS — N2581 Secondary hyperparathyroidism of renal origin: Secondary | ICD-10-CM | POA: Diagnosis not present

## 2023-06-19 DIAGNOSIS — K429 Umbilical hernia without obstruction or gangrene: Secondary | ICD-10-CM | POA: Diagnosis not present

## 2023-06-19 DIAGNOSIS — Z6833 Body mass index (BMI) 33.0-33.9, adult: Secondary | ICD-10-CM | POA: Diagnosis not present

## 2023-06-19 DIAGNOSIS — J309 Allergic rhinitis, unspecified: Secondary | ICD-10-CM | POA: Diagnosis not present

## 2023-06-19 DIAGNOSIS — E7800 Pure hypercholesterolemia, unspecified: Secondary | ICD-10-CM | POA: Diagnosis not present

## 2023-06-19 DIAGNOSIS — E669 Obesity, unspecified: Secondary | ICD-10-CM | POA: Diagnosis not present

## 2023-06-19 DIAGNOSIS — I2510 Atherosclerotic heart disease of native coronary artery without angina pectoris: Secondary | ICD-10-CM | POA: Diagnosis not present

## 2023-06-19 DIAGNOSIS — C679 Malignant neoplasm of bladder, unspecified: Secondary | ICD-10-CM | POA: Diagnosis not present

## 2023-06-19 DIAGNOSIS — I129 Hypertensive chronic kidney disease with stage 1 through stage 4 chronic kidney disease, or unspecified chronic kidney disease: Secondary | ICD-10-CM | POA: Diagnosis not present

## 2023-06-19 DIAGNOSIS — N1832 Chronic kidney disease, stage 3b: Secondary | ICD-10-CM | POA: Diagnosis not present

## 2023-06-19 DIAGNOSIS — M109 Gout, unspecified: Secondary | ICD-10-CM | POA: Diagnosis not present

## 2023-06-22 DIAGNOSIS — N2581 Secondary hyperparathyroidism of renal origin: Secondary | ICD-10-CM | POA: Diagnosis not present

## 2023-06-22 DIAGNOSIS — N1832 Chronic kidney disease, stage 3b: Secondary | ICD-10-CM | POA: Diagnosis not present

## 2023-06-22 DIAGNOSIS — R809 Proteinuria, unspecified: Secondary | ICD-10-CM | POA: Diagnosis not present

## 2023-06-22 DIAGNOSIS — D631 Anemia in chronic kidney disease: Secondary | ICD-10-CM | POA: Diagnosis not present

## 2023-06-22 DIAGNOSIS — I129 Hypertensive chronic kidney disease with stage 1 through stage 4 chronic kidney disease, or unspecified chronic kidney disease: Secondary | ICD-10-CM | POA: Diagnosis not present

## 2023-06-25 DIAGNOSIS — C61 Malignant neoplasm of prostate: Secondary | ICD-10-CM | POA: Diagnosis not present

## 2023-07-15 ENCOUNTER — Other Ambulatory Visit: Payer: Self-pay | Admitting: Cardiovascular Disease

## 2023-07-15 DIAGNOSIS — E782 Mixed hyperlipidemia: Secondary | ICD-10-CM

## 2023-08-16 ENCOUNTER — Other Ambulatory Visit: Payer: Self-pay | Admitting: Cardiovascular Disease

## 2023-08-16 DIAGNOSIS — E782 Mixed hyperlipidemia: Secondary | ICD-10-CM

## 2023-08-24 ENCOUNTER — Ambulatory Visit: Payer: Self-pay | Admitting: General Surgery

## 2023-08-24 DIAGNOSIS — K429 Umbilical hernia without obstruction or gangrene: Secondary | ICD-10-CM | POA: Diagnosis not present

## 2023-08-28 ENCOUNTER — Other Ambulatory Visit: Payer: Self-pay | Admitting: Cardiovascular Disease

## 2023-08-28 DIAGNOSIS — E782 Mixed hyperlipidemia: Secondary | ICD-10-CM

## 2023-09-14 NOTE — Pre-Procedure Instructions (Signed)
Surgical Instructions   Your procedure is scheduled on Monday, February 3rd. Report to [facility] Main Entrance "A" at 08:30 A.M., then check in with the Admitting office. Any questions or running late day of surgery: call [phone]  Questions prior to your surgery date: call [phone], Monday-Friday, 8am-4pm. If you experience any cold or flu symptoms such as cough, fever, chills, shortness of breath, etc. between now and your scheduled surgery, please notify us at the above number.     Remember:  Do not eat after midnight the night before your surgery   You may drink clear liquids until 07:30 AM the morning of your surgery.   Clear liquids allowed are: Water, Non-Citrus Juices (without pulp), Carbonated Beverages, Clear Tea (no milk, honey, etc.), Black Coffee Only (NO MILK, CREAM OR POWDERED CREAMER of any kind), and Gatorade.    Take these medicines the morning of surgery with A SIP OF WATER  amLODipine (NORVASC)  fluticasone (FLONASE)  rosuvastatin (CRESTOR)     Follow your surgeon's instructions on when to stop Aspirin.  If no instructions were given by your surgeon then you will need to call the office to get those instructions.     Stop taking Farxiga 72 hours prior to surgery. Last dose 1/30.  One week prior to surgery, STOP taking any Aleve, Naproxen, Ibuprofen, Motrin, Advil, Goody's, BC's, all herbal medications, fish oil, and non-prescription vitamins.                     Do NOT Smoke (Tobacco/Vaping) for 24 hours prior to your procedure.  If you use a CPAP at night, you may bring your mask/headgear for your overnight stay.   You will be asked to remove any contacts, glasses, piercing's, hearing aid's, dentures/partials prior to surgery. Please bring cases for these items if needed.    Patients discharged the day of surgery will not be allowed to drive home, and someone needs to stay with them for 24 hours.  SURGICAL WAITING ROOM VISITATION Patients may  have no more than 2 support people in the waiting area - these visitors may rotate.   Pre-op nurse will coordinate an appropriate time for 1 ADULT support person, who may not rotate, to accompany patient in pre-op.  Children under the age of 50 must have an adult with them who is not the patient and must remain in the main waiting area with an adult.  If the patient needs to stay at the hospital during part of their recovery, the visitor guidelines for inpatient rooms apply.  Please refer to the [facility] website for the visitor guidelines for any additional information.   If you received a COVID test during your pre-op visit  it is requested that you wear a mask when out in public, stay away from anyone that may not be feeling well and notify your surgeon if you develop symptoms. If you have been in contact with anyone that has tested positive in the last 10 days please notify you surgeon.      Pre-operative CHG Bathing Instructions   You can play a key role in reducing the risk of infection after surgery. Your skin needs to be as free of germs as possible. You can reduce the number of germs on your skin by washing with CHG (chlorhexidine gluconate) soap before surgery. CHG is an antiseptic soap that kills germs and continues to kill germs even after washing.   DO NOT use if you have an allergy  to chlorhexidine/CHG or antibacterial soaps. If your skin becomes reddened or irritated, stop using the CHG and notify one of our RNs at [phone].              TAKE A SHOWER THE NIGHT BEFORE SURGERY AND THE DAY OF SURGERY    Please keep in mind the following:  DO NOT shave, including legs and underarms, 48 hours prior to surgery.   You may shave your face before/day of surgery.  Place clean sheets on your bed the night before surgery Use a clean washcloth (not used since being washed) for each shower. DO NOT sleep with pet's night before surgery.  CHG Shower Instructions:  Wash your face  and private area with normal soap. If you choose to wash your hair, wash first with your normal shampoo.  After you use shampoo/soap, rinse your hair and body thoroughly to remove shampoo/soap residue.  Turn the water OFF and apply half the bottle of CHG soap to a CLEAN washcloth.  Apply CHG soap ONLY FROM YOUR NECK DOWN TO YOUR TOES (washing for 3-5 minutes)  DO NOT use CHG soap on face, private areas, open wounds, or sores.  Pay special attention to the area where your surgery is being performed.  If you are having back surgery, having someone wash your back for you may be helpful. Wait 2 minutes after CHG soap is applied, then you may rinse off the CHG soap.  Pat dry with a clean towel  Put on clean pajamas    Additional instructions for the day of surgery: DO NOT APPLY any lotions, deodorants, cologne, or perfumes.   Do not wear jewelry or makeup Do not wear nail polish, gel polish, artificial nails, or any other type of covering on natural nails (fingers and toes) Do not bring valuables to the hospital. [facility] is not responsible for valuables/personal belongings. Put on clean/comfortable clothes.  Please brush your teeth.  Ask your nurse before applying any prescription medications to the skin.

## 2023-09-15 ENCOUNTER — Other Ambulatory Visit: Payer: Self-pay

## 2023-09-15 ENCOUNTER — Telehealth: Payer: Self-pay | Admitting: Cardiovascular Disease

## 2023-09-15 ENCOUNTER — Encounter (HOSPITAL_COMMUNITY)
Admission: RE | Admit: 2023-09-15 | Discharge: 2023-09-15 | Disposition: A | Discharge status: Home / Self Care | Payer: PPO | Source: Ambulatory Visit | Attending: General Surgery | Admitting: General Surgery

## 2023-09-15 ENCOUNTER — Encounter (HOSPITAL_COMMUNITY): Payer: Self-pay

## 2023-09-15 VITALS — BP 123/81 | HR 84 | Temp 98.4°F | Resp 18 | Ht 70.5 in | Wt 237.9 lb

## 2023-09-15 DIAGNOSIS — Z01818 Encounter for other preprocedural examination: Secondary | ICD-10-CM | POA: Diagnosis not present

## 2023-09-15 LAB — CBC
HCT: 42.3 % (ref 39.0–52.0)
Hemoglobin: 13.9 g/dL (ref 13.0–17.0)
MCH: 28.8 pg (ref 26.0–34.0)
MCHC: 32.9 g/dL (ref 30.0–36.0)
MCV: 87.6 fL (ref 80.0–100.0)
Platelets: 193 10*3/uL (ref 150–400)
RBC: 4.83 MIL/uL (ref 4.22–5.81)
RDW: 13.9 % (ref 11.5–15.5)
WBC: 6.3 10*3/uL (ref 4.0–10.5)
nRBC: 0 % (ref 0.0–0.2)

## 2023-09-15 LAB — BASIC METABOLIC PANEL
Anion gap: 7 (ref 5–15)
BUN: 21 mg/dL (ref 8–23)
CO2: 25 mmol/L (ref 22–32)
Calcium: 10.1 mg/dL (ref 8.9–10.3)
Chloride: 107 mmol/L (ref 98–111)
Creatinine, Ser: 1.6 mg/dL — ABNORMAL HIGH (ref 0.61–1.24)
GFR, Estimated: 46 mL/min — ABNORMAL LOW (ref >60)
Glucose, Bld: 94 mg/dL (ref 70–99)
Potassium: 5.1 mmol/L (ref 3.5–5.1)
Sodium: 139 mmol/L (ref 135–145)

## 2023-09-15 LAB — SURGICAL PCR SCREEN
MRSA, PCR: NEGATIVE
Staphylococcus aureus: NEGATIVE

## 2023-09-15 NOTE — Progress Notes (Signed)
PCP - Tisovec, Adelfa Koh  Cardiologist -  Dr. Allyson Sabal (last office 12-18-20. Per patient is has been a couple of years.)  PPM/ICD - Denies Device Orders - n/a Rep Notified - n/a  Chest x-ray - denies EKG - 09-15-23 Stress Test - 02-19-19 ECHO - 02-17-22 Cardiac Cath - denies  Sleep Study - denies CPAP - n./a  Dm -denies  Blood Thinner Instructions: denies Aspirin Instructions: Per patient is to decrease ASA to 81 mg today 09-15-23 per surgeon  ERAS Protcol - clear liquids until 7:30   COVID TEST- n/a   Anesthesia review: yes patient follow with cardiology for a thoracic aortic aneurysm (measuring 44.3 per patient) per patient aneurysm has not grown in past 5 years  Patient denies shortness of breath, fever, cough and chest pain at PAT appointment   All instructions explained to the patient, with a verbal understanding of the material. Patient agrees to go over the instructions while at home for a better understanding. Patient also instructed to self quarantine after being tested for COVID-19. The opportunity to ask questions was provided.

## 2023-09-15 NOTE — Telephone Encounter (Signed)
   Pre-operative Risk Assessment    Patient Name: [name]  DOB: 20-Jul-1951 MRN: [id number]      Request for Surgical Clearance    Procedure:   umbilical hernia repair  Date of Surgery:  Clearance 09/21/23                                 Surgeon:  Renae Fickle toth Surgeon's Group or Practice Name:  [facility] Phone number:  [phone] Fax number:  [phone]   Type of Clearance Requested:   - Medical    Type of Anesthesia:  General    Additional requests/questions:    Signed, April L Harrington   09/15/2023, 10:41 AM

## 2023-09-15 NOTE — Telephone Encounter (Signed)
Pt has been scheduled in office appt 09/16/23 with Juanda Crumble, PAC. I will update all parties involved.

## 2023-09-15 NOTE — Anesthesia Preprocedure Evaluation (Addendum)
Anesthesia Evaluation  Patient identified by MRN, date of birth, ID band Patient awake    Reviewed: Allergy & Precautions, NPO status , Patient's Chart, lab work & pertinent test results  Airway Mallampati: II  TM Distance: >3 FB Neck ROM: Full    Dental  (+) Teeth Intact, Dental Advisory Given   Pulmonary PE   Pulmonary exam normal breath sounds clear to auscultation       Cardiovascular hypertension, Pt. on medications (-) angina + CAD  (-) Past MI Normal cardiovascular exam Rhythm:Regular Rate:Normal     Neuro/Psych negative neurological ROS  negative psych ROS   GI/Hepatic Neg liver ROS,GERD  ,,  Endo/Other  Obesity   Renal/GU Renal InsufficiencyRenal disease     Musculoskeletal negative musculoskeletal ROS (+)    Abdominal  (+) + obese  Peds  Hematology negative hematology ROS (+)   Anesthesia Other Findings umbilical hernia  Reproductive/Obstetrics                             Anesthesia Physical Anesthesia Plan  ASA: 3  Anesthesia Plan: General   Post-op Pain Management: Tylenol PO (pre-op)* and Gabapentin PO (pre-op)*   Induction: Intravenous  PONV Risk Score and Plan: 3 and Dexamethasone and Ondansetron  Airway Management Planned: Oral ETT  Additional Equipment:   Intra-op Plan:   Post-operative Plan: Extubation in OR  Informed Consent: I have reviewed the patients History and Physical, chart, labs and discussed the procedure including the risks, benefits and alternatives for the proposed anesthesia with the patient or authorized representative who has indicated his/her understanding and acceptance.     Dental advisory given  Plan Discussed with: CRNA  Anesthesia Plan Comments:        Anesthesia Quick Evaluation

## 2023-09-15 NOTE — Telephone Encounter (Signed)
   Name: ALEKSA CATTERTON  DOB: [birth year]  MRN: [id number]  Primary Cardiologist: Nanetta Batty, MD  Pt last seen in 2022  Chart reviewed as part of pre-operative protocol coverage. Because of Aran Menning Calip's past medical history and time since last visit, he will require a follow-up in-office visit in order to better assess preoperative cardiovascular risk.  Pre-op covering staff: - Please schedule appointment and call patient to inform them. If patient already had an upcoming appointment within acceptable timeframe, please add "pre-op clearance" to the appointment notes so provider is aware. - Please contact requesting surgeon's office via preferred method (i.e, phone, fax) to inform them of need for appointment prior to surgery.  Tereso Newcomer, PA-C  09/15/2023, 12:31 PM

## 2023-09-15 NOTE — Progress Notes (Unsigned)
Cardiology Office Note   Date:  09/16/2023  ID:  [name], DOB 27-Sep-1950, MRN [id number] PCP:  Gaspar Garbe, MD [facility] Cardiologist: Nanetta Batty, MD  Reason for visit: Preop clearance  Procedure:   umbilical hernia repair Date of Surgery:  Clearance 09/21/23                              Surgeon:  Renae Fickle toth Surgeon's Group or Practice Name:  [facility] Phone number:  [phone] Fax number:  [phone]  History of Present Illness    [name] is a 73 y.o. adult with a hx of coronary calcification seen on chest CT in 2020, family history of CAD (mother in her 8s, father in early 69s), ascending thoracic aortic aneurysm, CKD.  He last saw Dr. Allyson Sabal in May 2022.  He was doing well with improved LDL of 62 on Crestor.  Chest CTA 2022 showed stable ascending thoracic aortic aneurysm at 44 mm.  Repeat CTA 10/2021 with stable 44mm aneurysm.  Today, patient states he is doing well from a cardiac standpoint.  His umbilical hernia is now more bothersome and he has to make sure he walks very erectly -scheduled for umbilical hernia repair February 3.  Patient denies chest pain, shortness of breath, lower extremity edema, PND and orthopnea.  He has rare lightheadedness.  No syncope.  He follows regularly with his nephrologist, creatinine improved from last year.  Blood pressure well-controlled.  He notes that he is slightly behind on his annual CT angio of the chest to follow his thoracic aortic aneurysm.   Objective / Physical Exam   Vital signs:  BP 124/74 (BP Location: Left Arm, Patient Position: Sitting, Cuff Size: Normal)   Pulse 81   Ht 5\' 11"  (1.803 m)   Wt 237 lb 12.8 oz (107.9 kg)   SpO2 96%   BMI 33.17 kg/m     GEN: No acute distress NECK: No carotid bruits CARDIAC: RRR, no murmurs RESPIRATORY:  Clear to auscultation without rales, wheezing or rhonchi  EXTREMITIES: No edema  Assessment and Plan   Preop clearance for umbilical hernia  repair    :21 [name] perioperative risk of a major cardiac event is 0.4% according to the Revised Cardiac Risk Index (RCRI).  Therefore, he is at low risk for perioperative complications.   His functional capacity is excellent at 7.59 METs according to the Duke Activity Status Index (DASI). Recommendations: According to ACC/AHA guidelines, no further cardiovascular testing needed.  The patient may proceed to surgery at acceptable risk.   Antiplatelet and/or Anticoagulation Recommendations: We prefer to continue ASA throughout the perioperative period. However, if doing so will significantly increase morbidity or mortality, may hold for 5-7 days and restart as soon after when deemed safe per surgeon.  Coronary artery calcification -History of coronary calcification seen on chest CT with coronary calcium score measured 10/26/2019 of 498.  -Continue aspirin and statin therapy.  Thoracic aortic aneurysm -CT angio chest aorta March 2023 with stable 4.4 cm ascending aortic aneurysm; measured the same on echo 02/2022 --> repeat CTA annually-ordered today. -Continue blood pressure control.  Hypertension, well-controlled -Blood pressure managed by his nephrologist and PCP. -Goal BP is <130/80.  Recommend DASH diet (high in vegetables, fruits, low-fat dairy products, whole grains, poultry, fish, and nuts and low in sweets, sugar-sweetened beverages, and red meats), salt restriction and increase physical activity.  Hyperlipidemia with goal LDL  less than 70 -LDL 69 in May 2024.  Continue Crestor 20 mg daily. -Recommend cholesterol lowering diets - Mediterranean diet, DASH diet, vegetarian diet, low-carbohydrate diet and avoidance of trans fats.  Discussed healthier choice substitutes.  Nuts, high-fiber foods, and fiber supplements may also improve lipids.    Disposition - Follow-up in 1 year.   Signed, Cannon Kettle, PA-C  09/16/2023 [facility]

## 2023-09-16 ENCOUNTER — Ambulatory Visit: Payer: PPO | Attending: Physician Assistant | Admitting: Physician Assistant

## 2023-09-16 ENCOUNTER — Encounter: Payer: Self-pay | Admitting: Physician Assistant

## 2023-09-16 VITALS — BP 124/74 | HR 81 | Ht 71.0 in | Wt 237.8 lb

## 2023-09-16 DIAGNOSIS — I712 Thoracic aortic aneurysm, without rupture, unspecified: Secondary | ICD-10-CM

## 2023-09-16 DIAGNOSIS — I2510 Atherosclerotic heart disease of native coronary artery without angina pectoris: Secondary | ICD-10-CM | POA: Diagnosis not present

## 2023-09-16 DIAGNOSIS — E782 Mixed hyperlipidemia: Secondary | ICD-10-CM | POA: Diagnosis not present

## 2023-09-16 DIAGNOSIS — Z01818 Encounter for other preprocedural examination: Secondary | ICD-10-CM

## 2023-09-16 NOTE — Patient Instructions (Addendum)
Medication Instructions:  Your physician recommends that you continue on your current medications as directed. Please refer to the Current Medication list given to you today.  *If you need a refill on your cardiac medications before your next appointment, please call your pharmacy*   Testing/Procedures: In 1-2 months Non-Cardiac CT Angiography (CTA), is a special type of CT scan that uses a computer to produce multi-dimensional views of major blood vessels throughout the body. In CT angiography, a contrast material is injected through an IV to help visualize the blood vessels   Follow-Up: At [facility], you and your health needs are our priority.  As part of our continuing mission to provide you with exceptional heart care, we have created designated Provider Care Teams.  These Care Teams include your primary Cardiologist (physician) and Advanced Practice Providers (APPs -  Physician Assistants and Nurse Practitioners) who all work together to provide you with the care you need, when you need it.   Your next appointment:   12 months  Provider:   Nanetta Batty, MD

## 2023-09-21 ENCOUNTER — Other Ambulatory Visit: Payer: Self-pay

## 2023-09-21 ENCOUNTER — Encounter (HOSPITAL_COMMUNITY): Payer: Self-pay | Admitting: General Surgery

## 2023-09-21 ENCOUNTER — Ambulatory Visit (HOSPITAL_COMMUNITY)
Admission: RE | Admit: 2023-09-21 | Discharge: 2023-09-21 | Disposition: A | Discharge status: Home / Self Care | Payer: PPO | Source: Ambulatory Visit | Attending: General Surgery | Admitting: General Surgery

## 2023-09-21 ENCOUNTER — Ambulatory Visit (HOSPITAL_COMMUNITY): Payer: PPO | Admitting: Vascular Surgery

## 2023-09-21 ENCOUNTER — Encounter (HOSPITAL_COMMUNITY)
Admission: RE | Disposition: A | Discharge status: Home / Self Care | Payer: Self-pay | Source: Ambulatory Visit | Attending: General Surgery

## 2023-09-21 ENCOUNTER — Ambulatory Visit (HOSPITAL_BASED_OUTPATIENT_CLINIC_OR_DEPARTMENT_OTHER): Payer: PPO | Admitting: Anesthesiology

## 2023-09-21 DIAGNOSIS — Z86711 Personal history of pulmonary embolism: Secondary | ICD-10-CM | POA: Diagnosis not present

## 2023-09-21 DIAGNOSIS — Z7982 Long term (current) use of aspirin: Secondary | ICD-10-CM | POA: Insufficient documentation

## 2023-09-21 DIAGNOSIS — I129 Hypertensive chronic kidney disease with stage 1 through stage 4 chronic kidney disease, or unspecified chronic kidney disease: Secondary | ICD-10-CM

## 2023-09-21 DIAGNOSIS — Z6833 Body mass index (BMI) 33.0-33.9, adult: Secondary | ICD-10-CM | POA: Insufficient documentation

## 2023-09-21 DIAGNOSIS — K429 Umbilical hernia without obstruction or gangrene: Secondary | ICD-10-CM | POA: Diagnosis not present

## 2023-09-21 DIAGNOSIS — I2510 Atherosclerotic heart disease of native coronary artery without angina pectoris: Secondary | ICD-10-CM

## 2023-09-21 DIAGNOSIS — E669 Obesity, unspecified: Secondary | ICD-10-CM | POA: Insufficient documentation

## 2023-09-21 DIAGNOSIS — N189 Chronic kidney disease, unspecified: Secondary | ICD-10-CM | POA: Diagnosis not present

## 2023-09-21 DIAGNOSIS — N183 Chronic kidney disease, stage 3 unspecified: Secondary | ICD-10-CM

## 2023-09-21 HISTORY — PX: UMBILICAL HERNIA REPAIR: SHX196

## 2023-09-21 SURGERY — REPAIR, HERNIA, UMBILICAL, ADULT
Anesthesia: General | Site: Abdomen

## 2023-09-21 MED ORDER — FENTANYL CITRATE (PF) 100 MCG/2ML IJ SOLN: 25.0000 ug | INTRAMUSCULAR | Status: DC | PRN

## 2023-09-21 MED ORDER — FENTANYL CITRATE (PF) 250 MCG/5ML IJ SOLN
INTRAMUSCULAR | Status: AC
  Filled 2023-09-21: qty 5

## 2023-09-21 MED ORDER — LACTATED RINGERS IV SOLN: INTRAVENOUS | Status: DC | PRN

## 2023-09-21 MED ORDER — OXYCODONE HCL 5 MG PO TABS
5.0000 mg | ORAL_TABLET | Freq: Once | ORAL | Status: AC
  Administered 2023-09-21: 5 mg via ORAL

## 2023-09-21 MED ORDER — CHLORHEXIDINE GLUCONATE 0.12 % MT SOLN
15.0000 mL | Freq: Once | OROMUCOSAL | Status: AC
  Administered 2023-09-21: 15 mL via OROMUCOSAL
  Filled 2023-09-21: qty 15

## 2023-09-21 MED ORDER — PROPOFOL 10 MG/ML IV BOLUS
INTRAVENOUS | Status: AC
  Filled 2023-09-21: qty 20

## 2023-09-21 MED ORDER — FENTANYL CITRATE (PF) 250 MCG/5ML IJ SOLN
INTRAMUSCULAR | Status: DC | PRN
  Administered 2023-09-21: 100 ug via INTRAVENOUS
  Administered 2023-09-21 (×3): 50 ug via INTRAVENOUS

## 2023-09-21 MED ORDER — ACETAMINOPHEN 500 MG PO TABS
1000.0000 mg | ORAL_TABLET | ORAL | Status: AC
  Administered 2023-09-21: 1000 mg via ORAL
  Filled 2023-09-21: qty 2

## 2023-09-21 MED ORDER — DEXAMETHASONE SODIUM PHOSPHATE 10 MG/ML IJ SOLN
INTRAMUSCULAR | Status: AC
  Filled 2023-09-21: qty 1

## 2023-09-21 MED ORDER — PROPOFOL 10 MG/ML IV BOLUS
INTRAVENOUS | Status: DC | PRN
  Administered 2023-09-21: 40 mg via INTRAVENOUS
  Administered 2023-09-21: 100 mg via INTRAVENOUS

## 2023-09-21 MED ORDER — ONDANSETRON HCL 4 MG/2ML IJ SOLN
INTRAMUSCULAR | Status: AC
  Filled 2023-09-21: qty 2

## 2023-09-21 MED ORDER — BUPIVACAINE-EPINEPHRINE (PF) 0.25% -1:200000 IJ SOLN
INTRAMUSCULAR | Status: AC
  Filled 2023-09-21: qty 30

## 2023-09-21 MED ORDER — EPHEDRINE SULFATE-NACL 50-0.9 MG/10ML-% IV SOSY
PREFILLED_SYRINGE | INTRAVENOUS | Status: DC | PRN
  Administered 2023-09-21: 5 mg via INTRAVENOUS

## 2023-09-21 MED ORDER — ROCURONIUM BROMIDE 10 MG/ML (PF) SYRINGE
PREFILLED_SYRINGE | INTRAVENOUS | Status: AC
  Filled 2023-09-21: qty 10

## 2023-09-21 MED ORDER — ONDANSETRON HCL 4 MG/2ML IJ SOLN: 4.0000 mg | Freq: Once | INTRAMUSCULAR | Status: DC | PRN

## 2023-09-21 MED ORDER — CHLORHEXIDINE GLUCONATE CLOTH 2 % EX PADS: 6.0000 | MEDICATED_PAD | Freq: Once | CUTANEOUS | Status: DC

## 2023-09-21 MED ORDER — DEXAMETHASONE SODIUM PHOSPHATE 10 MG/ML IJ SOLN
INTRAMUSCULAR | Status: DC | PRN
  Administered 2023-09-21: 5 mg via INTRAVENOUS

## 2023-09-21 MED ORDER — ROCURONIUM BROMIDE 10 MG/ML (PF) SYRINGE
PREFILLED_SYRINGE | INTRAVENOUS | Status: DC | PRN
  Administered 2023-09-21: 60 mg via INTRAVENOUS

## 2023-09-21 MED ORDER — ORAL CARE MOUTH RINSE: 15.0000 mL | Freq: Once | OROMUCOSAL | Status: AC

## 2023-09-21 MED ORDER — ONDANSETRON HCL 4 MG/2ML IJ SOLN
INTRAMUSCULAR | Status: DC | PRN
  Administered 2023-09-21: 4 mg via INTRAVENOUS

## 2023-09-21 MED ORDER — CEFAZOLIN SODIUM-DEXTROSE 2-4 GM/100ML-% IV SOLN
2.0000 g | INTRAVENOUS | Status: AC
  Administered 2023-09-21: 2 g via INTRAVENOUS
  Filled 2023-09-21: qty 100

## 2023-09-21 MED ORDER — LIDOCAINE 2% (20 MG/ML) 5 ML SYRINGE
INTRAMUSCULAR | Status: AC
  Filled 2023-09-21: qty 5

## 2023-09-21 MED ORDER — GABAPENTIN 100 MG PO CAPS
100.0000 mg | ORAL_CAPSULE | ORAL | Status: AC
  Administered 2023-09-21: 100 mg via ORAL
  Filled 2023-09-21: qty 1

## 2023-09-21 MED ORDER — 0.9 % SODIUM CHLORIDE (POUR BTL) OPTIME
TOPICAL | Status: DC | PRN
  Administered 2023-09-21: 1000 mL

## 2023-09-21 MED ORDER — OXYCODONE HCL 5 MG PO TABS
ORAL_TABLET | ORAL | Status: AC
  Filled 2023-09-21: qty 1

## 2023-09-21 MED ORDER — SUGAMMADEX SODIUM 200 MG/2ML IV SOLN
INTRAVENOUS | Status: DC | PRN
  Administered 2023-09-21: 200 mg via INTRAVENOUS

## 2023-09-21 MED ORDER — BUPIVACAINE-EPINEPHRINE 0.25% -1:200000 IJ SOLN
INTRAMUSCULAR | Status: DC | PRN
  Administered 2023-09-21: 16 mL

## 2023-09-21 MED ORDER — LIDOCAINE 2% (20 MG/ML) 5 ML SYRINGE
INTRAMUSCULAR | Status: DC | PRN
  Administered 2023-09-21: 100 mg via INTRAVENOUS

## 2023-09-21 MED ORDER — OXYCODONE HCL 5 MG PO TABS: 5.0000 mg | ORAL_TABLET | Freq: Four times a day (QID) | ORAL | 0 refills | Status: DC | PRN

## 2023-09-21 SURGICAL SUPPLY — 27 items
BAG COUNTER SPONGE SURGICOUNT (BAG) ×1 IMPLANT
BINDER ABDOMINAL 12 XL 75-84 (SOFTGOODS) IMPLANT
BLADE CLIPPER SURG (BLADE) IMPLANT
CHLORAPREP W/TINT 26 (MISCELLANEOUS) ×1 IMPLANT
COVER SURGICAL LIGHT HANDLE (MISCELLANEOUS) ×1 IMPLANT
DERMABOND ADVANCED .7 DNX12 (GAUZE/BANDAGES/DRESSINGS) ×1 IMPLANT
DRAPE LAPAROSCOPIC ABDOMINAL (DRAPES) ×1 IMPLANT
ELECT REM PT RETURN 9FT ADLT (ELECTROSURGICAL) ×1 IMPLANT
ELECTRODE REM PT RTRN 9FT ADLT (ELECTROSURGICAL) ×1 IMPLANT
GAUZE SPONGE 4X4 12PLY STRL (GAUZE/BANDAGES/DRESSINGS) IMPLANT
GLOVE BIO SURGEON STRL SZ7.5 (GLOVE) ×1 IMPLANT
GOWN STRL REUS W/ TWL LRG LVL3 (GOWN DISPOSABLE) ×2 IMPLANT
KIT BASIN OR (CUSTOM PROCEDURE TRAY) ×1 IMPLANT
KIT TURNOVER KIT B (KITS) ×1 IMPLANT
MESH OVITEX 1S PERM 6X10 6L (Mesh General) IMPLANT
NDL HYPO 25GX1X1/2 BEV (NEEDLE) ×1 IMPLANT
NEEDLE HYPO 25GX1X1/2 BEV (NEEDLE) ×1 IMPLANT
NS IRRIG 1000ML POUR BTL (IV SOLUTION) ×1 IMPLANT
PACK GENERAL/GYN (CUSTOM PROCEDURE TRAY) ×1 IMPLANT
PAD ARMBOARD 7.5X6 YLW CONV (MISCELLANEOUS) ×1 IMPLANT
PENCIL SMOKE EVACUATOR (MISCELLANEOUS) ×1 IMPLANT
SUT MNCRL AB 4-0 PS2 18 (SUTURE) ×1 IMPLANT
SUT NOVA NAB DX-16 0-1 5-0 T12 (SUTURE) ×1 IMPLANT
SUT VIC AB 2-0 SH 27X BRD (SUTURE) ×1 IMPLANT
SYR CONTROL 10ML LL (SYRINGE) ×1 IMPLANT
TOWEL GREEN STERILE (TOWEL DISPOSABLE) ×1 IMPLANT
TOWEL GREEN STERILE FF (TOWEL DISPOSABLE) ×1 IMPLANT

## 2023-09-21 NOTE — Transfer of Care (Signed)
Immediate Anesthesia Transfer of Care Note  Patient: [name]  Procedure(s) Performed: UMBILICAL HERNIA REPAIR WITH MESH (Abdomen)  Patient Location: PACU  Anesthesia Type:General  Level of Consciousness: awake, alert , and oriented  Airway & Oxygen Therapy: Patient Spontanous Breathing and Patient connected to face mask oxygen  Post-op Assessment: Report given to RN and Post -op Vital signs reviewed and stable  Post vital signs: Reviewed and stable  Last Vitals:  Vitals Value Taken Time  BP    Temp    Pulse 71 09/21/23 1156  Resp 15 09/21/23 1156  SpO2 92 % 09/21/23 1156  Vitals shown include unfiled device data.  Last Pain:  Vitals:   09/21/23 0828  TempSrc:   PainSc: 0-No pain      Patients Stated Pain Goal: 0 (09/21/23 0828)  Complications: No notable events documented.

## 2023-09-21 NOTE — Anesthesia Procedure Notes (Signed)
Procedure Name: Intubation Date/Time: 09/21/2023 10:43 AM  Performed by: Allyn Kenner, CRNAPre-anesthesia Checklist: Patient identified, Emergency Drugs available, Suction available and Patient being monitored Patient Re-evaluated:Patient Re-evaluated prior to induction Oxygen Delivery Method: Circle System Utilized Preoxygenation: Pre-oxygenation with 100% oxygen Induction Type: IV induction Ventilation: Mask ventilation without difficulty Laryngoscope Size: Mac and 4 Grade View: Grade I Tube type: Oral Tube size: 7.0 mm Number of attempts: 1 Airway Equipment and Method: Stylet and Oral airway Placement Confirmation: ETT inserted through vocal cords under direct vision, positive ETCO2 and breath sounds checked- equal and bilateral Secured at: 22 cm Tube secured with: Tape Dental Injury: Teeth and Oropharynx as per pre-operative assessment

## 2023-09-21 NOTE — H&P (Signed)
REFERRING PHYSICIAN: Guerry Bruin, MD PROVIDER: Lindell Noe, MD MRN: [id number] DOB: 1951-07-20 Subjective   Chief Complaint: New Consultation (Umbilical Hernia)  History of Present Illness: [name] is a 73 y.o. male who is seen today as an office consultation for evaluation of New Consultation (Umbilical Hernia)  We are asked to see the patient in consultation by Dr. Wylene Simmer to evaluate him for an umbilical hernia. The patient is a 73 year old white male who has known about an umbilical hernia for about 8 years. During that time he has noticed that the bulge seems to be a little bit larger. He has only had a couple episodes of abdominal pain in the last few months but this did not last long and resolved on its own. He denies any nausea or vomiting. His appetite is good and his bowels are working normally. He has had a right inguinal hernia repair with mesh in the past. He also has a history of pulmonary embolus. He is on a regular aspirin for this now.  Review of Systems: A complete review of systems was obtained from the patient. I have reviewed this information and discussed as appropriate with the patient. See HPI as well for other ROS.  ROS   Medical History: Past Medical History:  Diagnosis Date  Chronic kidney disease  History of cancer   Patient Active Problem List  Diagnosis  Umbilical hernia without obstruction or gangrene   Past Surgical History:  Procedure Laterality Date  CHOLECYSTECTOMY  CYSTOTOMY W/EXCISION BLADDER TUMOR  HERNIA REPAIR    Allergies  Allergen Reactions  Mushroom Hives, Nausea and Vomiting  Succinylcholine Other (See Comments)  Myalgia  Vibramycin [Doxycycline Calcium] Hives and Nausea   Current Outpatient Medications on File Prior to Visit  Medication Sig Dispense Refill  amLODIPine (NORVASC) 10 MG tablet Take 10 mg by mouth at bedtime  aspirin 325 MG EC tablet Take 1 tablet by mouth once daily  dapagliflozin propanediol  (FARXIGA) 5 mg tablet  fluticasone propionate (FLONASE) 50 mcg/actuation nasal spray 2 spray in each nostril Nasally Once a day  FUROsemide (LASIX) 40 MG tablet Take 40 mg by mouth once daily  rosuvastatin (CRESTOR) 20 MG tablet Take 1 tablet by mouth once daily  sodium bicarbonate 325 MG tablet Take 650 mg by mouth  telmisartan (MICARDIS) 80 MG tablet Take 80 mg by mouth once daily   No current facility-administered medications on file prior to visit.   Family History  Problem Relation Age of Onset  Skin cancer Mother  High blood pressure (Hypertension) Mother  Hyperlipidemia (Elevated cholesterol) Mother  Breast cancer Mother  High blood pressure (Hypertension) Father  Hyperlipidemia (Elevated cholesterol) Father  Coronary Artery Disease (Blocked arteries around heart) Father    Social History   Tobacco Use  Smoking Status Never  Smokeless Tobacco Never    Social History   Socioeconomic History  Marital status: Married  Tobacco Use  Smoking status: Never  Smokeless tobacco: Never  Vaping Use  Vaping status: Never Used  Substance and Sexual Activity  Alcohol use: Yes  Drug use: Never   Objective:   Vitals:  BP: 138/78  Pulse: 88  Temp: 36.8 C (98.2 F)  Weight: (!) 106.9 kg (235 lb 9.6 oz)  Height: 179.1 cm (5' 10.5")  PainSc: 0-No pain   Body mass index is 33.33 kg/m.  Physical Exam Constitutional:  General: He is not in acute distress. Appearance: Normal appearance.  HENT:  Head: Normocephalic and atraumatic.  Right Ear: External ear  normal.  Left Ear: External ear normal.  Nose: Nose normal.  Mouth/Throat:  Mouth: Mucous membranes are moist.  Pharynx: Oropharynx is clear.  Eyes:  General: No scleral icterus. Extraocular Movements: Extraocular movements intact.  Conjunctiva/sclera: Conjunctivae normal.  Pupils: Pupils are equal, round, and reactive to light.  Cardiovascular:  Rate and Rhythm: Normal rate and regular rhythm.  Pulses: Normal  pulses.  Heart sounds: Normal heart sounds.  Pulmonary:  Effort: Pulmonary effort is normal. No respiratory distress.  Breath sounds: Normal breath sounds.  Abdominal:  General: Abdomen is flat. Bowel sounds are normal. There is no distension.  Palpations: Abdomen is soft.  Tenderness: There is no abdominal tenderness.  Comments: There is a fascial defect at the umbilicus measuring about 2 cm. The hernia reduces easily and is nontender.  Musculoskeletal:  General: No swelling or deformity. Normal range of motion.  Cervical back: Normal range of motion and neck supple. No tenderness.  Skin: General: Skin is warm and dry.  Coloration: Skin is not jaundiced.  Neurological:  General: No focal deficit present.  Mental Status: He is alert and oriented to person, place, and time.  Psychiatric:  Mood and Affect: Mood normal.  Behavior: Behavior normal.     Labs, Imaging and Diagnostic Testing:  Assessment and Plan:   Diagnoses and all orders for this visit:  Umbilical hernia without obstruction or gangrene   The patient has an umbilical hernia. Because of the risk of incarceration and strangulation I feel he would benefit from having this fixed. He would also like to have this done. I have discussed with him in detail the risks and benefits of the operation as well as some of the technical aspects including the use of mesh and he understands and wishes to proceed. With his history of pulmonary embolus I would be fine if he continued a baby aspirin prior to surgery. We will move forward with surgical scheduling.

## 2023-09-21 NOTE — Anesthesia Postprocedure Evaluation (Signed)
Anesthesia Post Note  Patient: [name]  Procedure(s) Performed: UMBILICAL HERNIA REPAIR WITH MESH (Abdomen)     Patient location during evaluation: PACU Anesthesia Type: General Level of consciousness: awake and alert and oriented Pain management: pain level controlled Vital Signs Assessment: post-procedure vital signs reviewed and stable Respiratory status: spontaneous breathing, nonlabored ventilation and respiratory function stable Cardiovascular status: blood pressure returned to baseline and stable Postop Assessment: no apparent nausea or vomiting Anesthetic complications: no   There were no known notable events for this encounter.  Last Vitals:  Vitals:   09/21/23 1245 09/21/23 1255  BP: 110/71   Pulse: 62 (!) 39  Resp: 12 19  Temp:  36.6 C  SpO2: 92% 95%    Last Pain:  Vitals:   09/21/23 1245  TempSrc:   PainSc: 4                  [name] A.

## 2023-09-21 NOTE — Interval H&P Note (Signed)
History and Physical Interval Note:  09/21/2023 9:10 AM  [name]  has presented today for surgery, with the diagnosis of umbilical hernia.  The various methods of treatment have been discussed with the patient and family. After consideration of risks, benefits and other options for treatment, the patient has consented to  Procedure(s): UMBILICAL HERNIA REPAIR WITH MESH (N/A) as a surgical intervention.  The patient's history has been reviewed, patient examined, no change in status, stable for surgery.  I have reviewed the patient's chart and labs.  Questions were answered to the patient's satisfaction.     Chevis Pretty III

## 2023-09-21 NOTE — Op Note (Addendum)
09/21/2023  11:39 AM  PATIENT:  [name]  73 y.o. male  PRE-OPERATIVE DIAGNOSIS:  umbilical hernia 3cm  POST-OPERATIVE DIAGNOSIS:  umbilical hernia 3cm  PROCEDURE:  Procedure(s): UMBILICAL HERNIA REPAIR WITH MESH (N/A)  SURGEON:  Surgeons and Role:    * Griselda Miner, MD - Primary  PHYSICIAN ASSISTANT:   ASSISTANTS: none   ANESTHESIA:   local and general  EBL:  minimal   BLOOD ADMINISTERED:none  DRAINS: none   LOCAL MEDICATIONS USED:  MARCAINE     SPECIMEN:  No Specimen  DISPOSITION OF SPECIMEN:  N/A  COUNTS:  YES  TOURNIQUET:  * No tourniquets in log *  DICTATION: .Dragon Dictation  After informed consent was obtained the patient was brought to the operating room and placed in the supine position on the operating table.  After adequate induction of general anesthesia the patient's abdomen was prepped with ChloraPrep, allowed to dry, and draped in usual sterile manner.  An appropriate timeout was performed.  The area around the umbilicus was infiltrated with quarter percent Marcaine.  A curvilinear incision was made with a 15 blade knife along the lower edge of the umbilicus.  The incision was carried through the skin and subcutaneous tissue sharply with the electrocautery until the fascia of the abdominal wall was encountered.  The hernia sac was opened.  There was only some preperitoneal fat within the hernia sac.  The hernia sac was excised sharply with the electrocautery.  The fascial edges were healthy and cleaned of any fatty tissue.  The fascial defect measured about 3 cm.  I then chose a piece of Ovitex 6x10cm 1S permanent mesh and cut it to the appropriate size.  I then anchored the mesh with 4U stitches of #1 Novafil around the edge of the hernia defect about 2 to 3 cm back from the edge of the fascia.  The mesh was oriented with the blue side towards the abdominal wall.  Once the stitches were placed and pulled up and tied the mesh was observed to be in good  apposition to the anterior abdominal wall.  There were no gaps or redundancy.  The fascial defect was then closed with interrupted #1 Novafil stitches incorporating a small bite of the central portion of the mesh.  Once this was accomplished then the hernia seem well repaired.  The wound was irrigated with copious amounts of saline.  The umbilicus was then tacked back to the fascia with interrupted 2-0 Vicryl stitches.  The subcutaneous tissue was closed with interrupted 2-0 Vicryl stitches.  The skin was then closed with a running 4-0 Monocryl subcuticular stitch.  Dermabond dressings were applied.  The patient tolerated the procedure well.  At the end of the case all needle sponge and instrument counts were correct.  The patient was then awakened and taken to recovery in stable condition.  PLAN OF CARE: Discharge to home after PACU  PATIENT DISPOSITION:  PACU - hemodynamically stable.   Delay start of Pharmacological VTE agent (>24hrs) due to surgical blood loss or risk of bleeding: not applicable

## 2023-09-24 ENCOUNTER — Encounter (HOSPITAL_COMMUNITY): Payer: Self-pay | Admitting: General Surgery

## 2023-09-29 DIAGNOSIS — C61 Malignant neoplasm of prostate: Secondary | ICD-10-CM | POA: Diagnosis not present

## 2023-10-12 DIAGNOSIS — N1832 Chronic kidney disease, stage 3b: Secondary | ICD-10-CM | POA: Diagnosis not present

## 2023-10-13 DIAGNOSIS — N1832 Chronic kidney disease, stage 3b: Secondary | ICD-10-CM | POA: Diagnosis not present

## 2023-10-19 DIAGNOSIS — N2581 Secondary hyperparathyroidism of renal origin: Secondary | ICD-10-CM | POA: Diagnosis not present

## 2023-10-19 DIAGNOSIS — N189 Chronic kidney disease, unspecified: Secondary | ICD-10-CM | POA: Diagnosis not present

## 2023-10-19 DIAGNOSIS — R809 Proteinuria, unspecified: Secondary | ICD-10-CM | POA: Diagnosis not present

## 2023-10-19 DIAGNOSIS — N1832 Chronic kidney disease, stage 3b: Secondary | ICD-10-CM | POA: Diagnosis not present

## 2023-10-19 DIAGNOSIS — D631 Anemia in chronic kidney disease: Secondary | ICD-10-CM | POA: Diagnosis not present

## 2023-10-19 DIAGNOSIS — I129 Hypertensive chronic kidney disease with stage 1 through stage 4 chronic kidney disease, or unspecified chronic kidney disease: Secondary | ICD-10-CM | POA: Diagnosis not present

## 2023-10-20 DIAGNOSIS — K429 Umbilical hernia without obstruction or gangrene: Secondary | ICD-10-CM | POA: Diagnosis not present

## 2023-10-26 DIAGNOSIS — C61 Malignant neoplasm of prostate: Secondary | ICD-10-CM | POA: Diagnosis not present

## 2023-10-26 DIAGNOSIS — R972 Elevated prostate specific antigen [PSA]: Secondary | ICD-10-CM | POA: Diagnosis not present

## 2023-10-26 DIAGNOSIS — Z8551 Personal history of malignant neoplasm of bladder: Secondary | ICD-10-CM | POA: Diagnosis not present

## 2023-11-02 DIAGNOSIS — N1832 Chronic kidney disease, stage 3b: Secondary | ICD-10-CM | POA: Diagnosis not present

## 2023-11-07 ENCOUNTER — Other Ambulatory Visit: Payer: Self-pay | Admitting: Cardiovascular Disease

## 2023-11-07 DIAGNOSIS — E782 Mixed hyperlipidemia: Secondary | ICD-10-CM

## 2023-11-23 ENCOUNTER — Ambulatory Visit (HOSPITAL_BASED_OUTPATIENT_CLINIC_OR_DEPARTMENT_OTHER)
Admission: RE | Admit: 2023-11-23 | Discharge: 2023-11-23 | Disposition: A | Discharge status: Home / Self Care | Payer: PPO | Source: Ambulatory Visit | Attending: Physician Assistant | Admitting: Physician Assistant

## 2023-11-23 DIAGNOSIS — I517 Cardiomegaly: Secondary | ICD-10-CM | POA: Diagnosis not present

## 2023-11-23 DIAGNOSIS — I7121 Aneurysm of the ascending aorta, without rupture: Secondary | ICD-10-CM | POA: Diagnosis not present

## 2023-11-23 DIAGNOSIS — I712 Thoracic aortic aneurysm, without rupture, unspecified: Secondary | ICD-10-CM | POA: Diagnosis not present

## 2023-11-23 DIAGNOSIS — I2510 Atherosclerotic heart disease of native coronary artery without angina pectoris: Secondary | ICD-10-CM | POA: Diagnosis not present

## 2023-11-23 LAB — POCT I-STAT CREATININE: Creatinine, Ser: 1.9 mg/dL — ABNORMAL HIGH (ref 0.61–1.24)

## 2023-11-23 MED ORDER — IOHEXOL 350 MG/ML SOLN
100.0000 mL | Freq: Once | INTRAVENOUS | Status: AC | PRN
  Administered 2023-11-23: 75 mL via INTRAVENOUS

## 2023-11-30 ENCOUNTER — Telehealth (HOSPITAL_BASED_OUTPATIENT_CLINIC_OR_DEPARTMENT_OTHER): Payer: Self-pay

## 2023-11-30 NOTE — Telephone Encounter (Addendum)
 Results viewed by patient via MyChart.----- Message from Conan December sent at 11/24/2023  9:11 AM EDT ----- History of chronic kidney disease, creatinine slightly higher than Jan 2025.  Follow-up with PCP/nephrology.

## 2024-01-20 DIAGNOSIS — Z1212 Encounter for screening for malignant neoplasm of rectum: Secondary | ICD-10-CM | POA: Diagnosis not present

## 2024-01-20 DIAGNOSIS — M109 Gout, unspecified: Secondary | ICD-10-CM | POA: Diagnosis not present

## 2024-01-20 DIAGNOSIS — Z79899 Other long term (current) drug therapy: Secondary | ICD-10-CM | POA: Diagnosis not present

## 2024-01-20 DIAGNOSIS — C61 Malignant neoplasm of prostate: Secondary | ICD-10-CM | POA: Diagnosis not present

## 2024-01-20 DIAGNOSIS — N1832 Chronic kidney disease, stage 3b: Secondary | ICD-10-CM | POA: Diagnosis not present

## 2024-01-20 DIAGNOSIS — I129 Hypertensive chronic kidney disease with stage 1 through stage 4 chronic kidney disease, or unspecified chronic kidney disease: Secondary | ICD-10-CM | POA: Diagnosis not present

## 2024-01-20 DIAGNOSIS — E538 Deficiency of other specified B group vitamins: Secondary | ICD-10-CM | POA: Diagnosis not present

## 2024-01-20 DIAGNOSIS — I2510 Atherosclerotic heart disease of native coronary artery without angina pectoris: Secondary | ICD-10-CM | POA: Diagnosis not present

## 2024-01-20 DIAGNOSIS — E7800 Pure hypercholesterolemia, unspecified: Secondary | ICD-10-CM | POA: Diagnosis not present

## 2024-01-20 DIAGNOSIS — K219 Gastro-esophageal reflux disease without esophagitis: Secondary | ICD-10-CM | POA: Diagnosis not present

## 2024-01-27 DIAGNOSIS — N052 Unspecified nephritic syndrome with diffuse membranous glomerulonephritis: Secondary | ICD-10-CM | POA: Diagnosis not present

## 2024-01-27 DIAGNOSIS — C679 Malignant neoplasm of bladder, unspecified: Secondary | ICD-10-CM | POA: Diagnosis not present

## 2024-01-27 DIAGNOSIS — R82998 Other abnormal findings in urine: Secondary | ICD-10-CM | POA: Diagnosis not present

## 2024-01-27 DIAGNOSIS — E669 Obesity, unspecified: Secondary | ICD-10-CM | POA: Diagnosis not present

## 2024-01-27 DIAGNOSIS — N1832 Chronic kidney disease, stage 3b: Secondary | ICD-10-CM | POA: Diagnosis not present

## 2024-01-27 DIAGNOSIS — C61 Malignant neoplasm of prostate: Secondary | ICD-10-CM | POA: Diagnosis not present

## 2024-01-27 DIAGNOSIS — E7800 Pure hypercholesterolemia, unspecified: Secondary | ICD-10-CM | POA: Diagnosis not present

## 2024-01-27 DIAGNOSIS — I2510 Atherosclerotic heart disease of native coronary artery without angina pectoris: Secondary | ICD-10-CM | POA: Diagnosis not present

## 2024-01-27 DIAGNOSIS — K429 Umbilical hernia without obstruction or gangrene: Secondary | ICD-10-CM | POA: Diagnosis not present

## 2024-01-27 DIAGNOSIS — I129 Hypertensive chronic kidney disease with stage 1 through stage 4 chronic kidney disease, or unspecified chronic kidney disease: Secondary | ICD-10-CM | POA: Diagnosis not present

## 2024-01-27 DIAGNOSIS — N2581 Secondary hyperparathyroidism of renal origin: Secondary | ICD-10-CM | POA: Diagnosis not present

## 2024-01-27 DIAGNOSIS — Z0000 Encounter for general adult medical examination without abnormal findings: Secondary | ICD-10-CM | POA: Diagnosis not present

## 2024-01-27 DIAGNOSIS — N2589 Other disorders resulting from impaired renal tubular function: Secondary | ICD-10-CM | POA: Diagnosis not present

## 2024-02-15 DIAGNOSIS — N1832 Chronic kidney disease, stage 3b: Secondary | ICD-10-CM | POA: Diagnosis not present

## 2024-02-22 DIAGNOSIS — I10 Essential (primary) hypertension: Secondary | ICD-10-CM | POA: Diagnosis not present

## 2024-02-22 DIAGNOSIS — N2581 Secondary hyperparathyroidism of renal origin: Secondary | ICD-10-CM | POA: Diagnosis not present

## 2024-02-22 DIAGNOSIS — R809 Proteinuria, unspecified: Secondary | ICD-10-CM | POA: Diagnosis not present

## 2024-02-22 DIAGNOSIS — N022 Recurrent and persistent hematuria with diffuse membranous glomerulonephritis: Secondary | ICD-10-CM | POA: Diagnosis not present

## 2024-02-22 DIAGNOSIS — D631 Anemia in chronic kidney disease: Secondary | ICD-10-CM | POA: Diagnosis not present

## 2024-02-22 DIAGNOSIS — N1832 Chronic kidney disease, stage 3b: Secondary | ICD-10-CM | POA: Diagnosis not present

## 2024-05-13 DIAGNOSIS — I712 Thoracic aortic aneurysm, without rupture, unspecified: Secondary | ICD-10-CM | POA: Diagnosis not present

## 2024-05-13 DIAGNOSIS — Z86018 Personal history of other benign neoplasm: Secondary | ICD-10-CM | POA: Diagnosis not present

## 2024-06-13 DIAGNOSIS — N1832 Chronic kidney disease, stage 3b: Secondary | ICD-10-CM | POA: Diagnosis not present

## 2024-06-13 DIAGNOSIS — C61 Malignant neoplasm of prostate: Secondary | ICD-10-CM | POA: Diagnosis not present

## 2024-06-14 DIAGNOSIS — D225 Melanocytic nevi of trunk: Secondary | ICD-10-CM | POA: Diagnosis not present

## 2024-06-14 DIAGNOSIS — L578 Other skin changes due to chronic exposure to nonionizing radiation: Secondary | ICD-10-CM | POA: Diagnosis not present

## 2024-06-14 DIAGNOSIS — Z23 Encounter for immunization: Secondary | ICD-10-CM | POA: Diagnosis not present

## 2024-06-14 DIAGNOSIS — L814 Other melanin hyperpigmentation: Secondary | ICD-10-CM | POA: Diagnosis not present

## 2024-06-14 DIAGNOSIS — L570 Actinic keratosis: Secondary | ICD-10-CM | POA: Diagnosis not present

## 2024-06-14 DIAGNOSIS — L820 Inflamed seborrheic keratosis: Secondary | ICD-10-CM | POA: Diagnosis not present

## 2024-06-14 DIAGNOSIS — L821 Other seborrheic keratosis: Secondary | ICD-10-CM | POA: Diagnosis not present

## 2024-06-14 DIAGNOSIS — Z85820 Personal history of malignant melanoma of skin: Secondary | ICD-10-CM | POA: Diagnosis not present

## 2024-06-21 DIAGNOSIS — R399 Unspecified symptoms and signs involving the genitourinary system: Secondary | ICD-10-CM | POA: Diagnosis not present

## 2024-06-21 DIAGNOSIS — C61 Malignant neoplasm of prostate: Secondary | ICD-10-CM | POA: Diagnosis not present

## 2024-06-24 DIAGNOSIS — I129 Hypertensive chronic kidney disease with stage 1 through stage 4 chronic kidney disease, or unspecified chronic kidney disease: Secondary | ICD-10-CM | POA: Diagnosis not present

## 2024-06-24 DIAGNOSIS — N2581 Secondary hyperparathyroidism of renal origin: Secondary | ICD-10-CM | POA: Diagnosis not present

## 2024-06-24 DIAGNOSIS — R809 Proteinuria, unspecified: Secondary | ICD-10-CM | POA: Diagnosis not present

## 2024-06-24 DIAGNOSIS — N1832 Chronic kidney disease, stage 3b: Secondary | ICD-10-CM | POA: Diagnosis not present

## 2024-06-24 DIAGNOSIS — N189 Chronic kidney disease, unspecified: Secondary | ICD-10-CM | POA: Diagnosis not present

## 2024-06-24 DIAGNOSIS — N022 Recurrent and persistent hematuria with diffuse membranous glomerulonephritis: Secondary | ICD-10-CM | POA: Diagnosis not present

## 2024-06-24 DIAGNOSIS — D631 Anemia in chronic kidney disease: Secondary | ICD-10-CM | POA: Diagnosis not present

## 2024-08-24 ENCOUNTER — Encounter: Payer: Self-pay | Admitting: Cardiovascular Disease

## 2024-09-20 ENCOUNTER — Ambulatory Visit: Admitting: Cardiovascular Disease

## 2024-09-20 ENCOUNTER — Encounter: Payer: Self-pay | Admitting: Cardiovascular Disease

## 2024-09-20 VITALS — BP 128/71 | HR 79 | Ht 71.0 in | Wt 234.0 lb

## 2024-09-20 DIAGNOSIS — E782 Mixed hyperlipidemia: Secondary | ICD-10-CM

## 2024-09-20 DIAGNOSIS — I2510 Atherosclerotic heart disease of native coronary artery without angina pectoris: Secondary | ICD-10-CM | POA: Diagnosis not present

## 2024-09-20 DIAGNOSIS — I7121 Aneurysm of the ascending aorta, without rupture: Secondary | ICD-10-CM

## 2024-09-20 DIAGNOSIS — I712 Thoracic aortic aneurysm, without rupture, unspecified: Secondary | ICD-10-CM

## 2024-09-20 DIAGNOSIS — I10 Essential (primary) hypertension: Secondary | ICD-10-CM | POA: Diagnosis not present

## 2024-09-20 NOTE — Assessment & Plan Note (Signed)
 History of hyperlipidemia on rosuvastatin  lipid profile performed 01/20/24 revealing total cholesterol 120, LDL 58 and HDL 36.

## 2024-09-20 NOTE — Patient Instructions (Signed)
 Medication Instructions:  Your physician recommends that you continue on your current medications as directed. Please refer to the Current Medication list given to you today.  *If you need a refill on your cardiac medications before your next appointment, please call your pharmacy*  Testing/Procedures: Your physician has requested that you have an echocardiogram. Echocardiography is a painless test that uses sound waves to create images of your heart. It provides your doctor with information about the size and shape of your heart and how well your hearts chambers and valves are working. This procedure takes approximately one hour. There are no restrictions for this procedure. Please do NOT wear cologne, perfume, aftershave, or lotions (deodorant is allowed). Please arrive 15 minutes prior to your appointment time.  Please note: We ask at that you not bring children with you during ultrasound (echo/ vascular) testing. Due to room size and safety concerns, children are not allowed in the ultrasound rooms during exams. Our front office staff cannot provide observation of children in our lobby area while testing is being conducted. An adult accompanying a patient to their appointment will only be allowed in the ultrasound room at the discretion of the ultrasound technician under special circumstances. We apologize for any inconvenience. **To do in April**   Follow-Up: At [facility], you and your health needs are our priority.  As part of our continuing mission to provide you with exceptional heart care, our providers are all part of one team.  This team includes your primary Cardiologist (physician) and Advanced Practice Providers or APPs (Physician Assistants and Nurse Practitioners) who all work together to provide you with the care you need, when you need it.  Your next appointment:   12 month(s)  Provider:   Dorn Lesches, MD    We recommend signing up for the patient portal called  MyChart.  Sign up information is provided on this After Visit Summary.  MyChart is used to connect with patients for Virtual Visits (Telemedicine).  Patients are able to view lab/test results, encounter notes, upcoming appointments, etc.  Non-urgent messages can be sent to your provider as well.   To learn more about what you can do with MyChart, go to [email or web address].   Other Instructions

## 2024-09-20 NOTE — Assessment & Plan Note (Signed)
 History of essential hypertension with blood pressure measured today at 128/71.  He is on amlodipine  and Micardis .

## 2024-09-20 NOTE — Assessment & Plan Note (Signed)
 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  This correlates with the dimensions by echo which we will continue to follow on annual basis.

## 2024-12-08 ENCOUNTER — Ambulatory Visit (HOSPITAL_COMMUNITY)
